# Patient Record
Sex: Female | Born: 1935
Health system: Southern US, Community
[De-identification: ages and names within clinical notes are randomized; demographics above are authoritative.]

## PROBLEM LIST (undated history)

## (undated) DIAGNOSIS — E119 Type 2 diabetes mellitus without complications: Secondary | ICD-10-CM

## (undated) DIAGNOSIS — H9190 Unspecified hearing loss, unspecified ear: Secondary | ICD-10-CM

## (undated) DIAGNOSIS — I10 Essential (primary) hypertension: Secondary | ICD-10-CM

## (undated) DIAGNOSIS — I509 Heart failure, unspecified: Secondary | ICD-10-CM

## (undated) DIAGNOSIS — E785 Hyperlipidemia, unspecified: Secondary | ICD-10-CM

## (undated) DIAGNOSIS — I219 Acute myocardial infarction, unspecified: Secondary | ICD-10-CM

## (undated) HISTORY — PX: ABDOMINAL HYSTERECTOMY: SHX81

## (undated) HISTORY — PX: CYSTOSCOPY: SUR368

## (undated) HISTORY — DX: Type 2 diabetes mellitus without complications: E11.9

## (undated) HISTORY — PX: CORONARY ANGIOPLASTY WITH STENT PLACEMENT: SHX49

## (undated) HISTORY — DX: Hyperlipidemia, unspecified: E78.5

## (undated) HISTORY — DX: Essential (primary) hypertension: I10

## (undated) HISTORY — DX: Acute myocardial infarction, unspecified: I21.9

## (undated) HISTORY — DX: Heart failure, unspecified: I50.9

---

## 2004-03-27 ENCOUNTER — Encounter: Admission: RE | Admit: 2004-03-27 | Discharge: 2004-03-27 | Payer: Self-pay | Admitting: Neurosurgery

## 2004-04-10 ENCOUNTER — Encounter: Admission: RE | Admit: 2004-04-10 | Discharge: 2004-04-10 | Payer: Self-pay | Admitting: Neurosurgery

## 2004-08-29 ENCOUNTER — Encounter: Admission: RE | Admit: 2004-08-29 | Discharge: 2004-08-29 | Payer: Self-pay | Admitting: Neurosurgery

## 2005-02-20 ENCOUNTER — Encounter: Admission: RE | Admit: 2005-02-20 | Discharge: 2005-02-20 | Payer: Self-pay | Admitting: Neurosurgery

## 2005-03-06 ENCOUNTER — Encounter: Admission: RE | Admit: 2005-03-06 | Discharge: 2005-03-06 | Payer: Self-pay | Admitting: Neurosurgery

## 2005-03-28 ENCOUNTER — Encounter: Admission: RE | Admit: 2005-03-28 | Discharge: 2005-03-28 | Payer: Self-pay | Admitting: Neurosurgery

## 2007-06-11 ENCOUNTER — Ambulatory Visit: Payer: Self-pay | Admitting: Cardiology

## 2007-06-15 ENCOUNTER — Ambulatory Visit: Payer: Self-pay | Admitting: Cardiology

## 2010-10-14 ENCOUNTER — Encounter: Payer: Self-pay | Admitting: Neurosurgery

## 2014-05-25 ENCOUNTER — Encounter (INDEPENDENT_AMBULATORY_CARE_PROVIDER_SITE_OTHER): Payer: Medicare HMO | Admitting: Ophthalmology

## 2014-05-25 DIAGNOSIS — H3530 Unspecified macular degeneration: Secondary | ICD-10-CM

## 2014-05-25 DIAGNOSIS — H35039 Hypertensive retinopathy, unspecified eye: Secondary | ICD-10-CM

## 2014-05-25 DIAGNOSIS — E11319 Type 2 diabetes mellitus with unspecified diabetic retinopathy without macular edema: Secondary | ICD-10-CM

## 2014-05-25 DIAGNOSIS — E1139 Type 2 diabetes mellitus with other diabetic ophthalmic complication: Secondary | ICD-10-CM

## 2014-05-25 DIAGNOSIS — E1165 Type 2 diabetes mellitus with hyperglycemia: Secondary | ICD-10-CM

## 2014-05-25 DIAGNOSIS — H43819 Vitreous degeneration, unspecified eye: Secondary | ICD-10-CM

## 2014-05-25 DIAGNOSIS — I10 Essential (primary) hypertension: Secondary | ICD-10-CM

## 2015-10-09 LAB — HEMOGLOBIN A1C: Hemoglobin A1C: 10.7

## 2015-11-09 ENCOUNTER — Encounter: Payer: Self-pay | Admitting: "Endocrinology

## 2015-11-09 ENCOUNTER — Encounter: Payer: Medicare HMO | Attending: "Endocrinology | Admitting: Nutrition

## 2015-11-09 ENCOUNTER — Ambulatory Visit (INDEPENDENT_AMBULATORY_CARE_PROVIDER_SITE_OTHER): Payer: Medicare HMO | Admitting: "Endocrinology

## 2015-11-09 VITALS — BP 140/90 | HR 64 | Ht 65.0 in | Wt 179.0 lb

## 2015-11-09 VITALS — Ht 67.0 in | Wt 179.0 lb

## 2015-11-09 DIAGNOSIS — I10 Essential (primary) hypertension: Secondary | ICD-10-CM | POA: Diagnosis not present

## 2015-11-09 DIAGNOSIS — E1159 Type 2 diabetes mellitus with other circulatory complications: Secondary | ICD-10-CM | POA: Insufficient documentation

## 2015-11-09 DIAGNOSIS — E118 Type 2 diabetes mellitus with unspecified complications: Secondary | ICD-10-CM

## 2015-11-09 DIAGNOSIS — E669 Obesity, unspecified: Secondary | ICD-10-CM

## 2015-11-09 DIAGNOSIS — E785 Hyperlipidemia, unspecified: Secondary | ICD-10-CM

## 2015-11-09 NOTE — Patient Instructions (Signed)

## 2015-11-09 NOTE — Progress Notes (Signed)
  Medical Nutrition Therapy:  Appt start time: 1400 end time:  1430.   Assessment:  Primary concerns today: Diabets. Lives with herself but has a female friend. Her female friend does the cooking. Walks with walker.  Ecoli infection in spring last year. A1C 10.7%. Likes to eat sweats and snack often. Is on 40 units of Levemir at night and Humalog with meals. Changing to take Levemir at night instead of in am. By Dr. Dorris Fetch today. Walk in visit.  Eats 2 meals per day. Tends to sleep in and only eat late breakfast/lunch and then dinner. Stays up at night and then snacks at times. Limited mobility. Diet is excessive in carbs and calories and low in fresh fruits, vegetables and whole grains.Needs to cut out snacks and sodas.Needs to be sure to check blood sugar before meals and take short acting insuln with meals three times per day.   Lab Results  Component Value Date   HGBA1C 10.7 10/09/2015    Preferred Learning Style:   No preference indicated   Learning Readiness:   Ready  Change in progress   MEDICATIONS: None   DIETARY INTAKE:   24-hr recall:  B ( AM): Eats late breakfast or early lunch. Sleeps in 9-10 am. Shredded wheat, bran flaks or rice krispies.  Snk ( AM): mis snack L ( PM): skips sometimes. Snk ( PM): misc crackers, cheese, chips, fruit, soda D ( PM): Meat, vegetable, Soda Snk ( PM): misc snacks. Water sometimes. Beverages: soda and water some  Usual physical activity: ADL   Estimated energy needs: 1200-1500  calories 135 g carbohydrates 90 g protein 33 g fat  Progress Towards Goal(s):  In progress.   Nutritional Diagnosis:  NB-1.1 Food and nutrition-related knowledge deficit As related to DM.  As evidenced by A1C >10%..    Intervention:  Nutrition and Diabetes education provided on My Plate, CHO counting, meal planning, portion sizes, timing of meals, avoiding snacks between meals unless having a low blood sugar, target ranges for A1C and blood sugars,  signs/symptoms and treatment of hyper/hypoglycemia, monitoring blood sugars, taking medications as prescribed, benefits of exercising 30 minutes per day and prevention of complications of DM. Marland Kitchen  Goals 1. Follow the Plate Method 2. Eat three meals per day. 3. Do not skip meals. 4 Eat 2-3 carb choices per meas. 5. Eat meals on time. 6. Take Levemir 40 units at night now instead of the am. 7. Get A1C. Down to 7.5%.  Teaching Method Utilized:  Visual Auditory Hands on  Handouts given during visit include:  My Plate Method  Meal Plan Card  Diabetes Instructions.   Barriers to learning/adherence to lifestyle change: None  Demonstrated degree of understanding via:  Teach Back   Monitoring/Evaluation:  Dietary intake, exercise, meal planning, SBG, and body weight in 1 month(s).

## 2015-11-09 NOTE — Progress Notes (Signed)
Subjective:    Patient ID: Stephanie Orozco, female    DOB: 01-21-1936. Patient is being seen in consultation for management of diabetes requested by  [facility], MD  Past Medical History  Diagnosis Date  . Diabetes mellitus, type II ([place])   . Hypertension   . Hyperlipidemia   . Heart attack ([place])   . Heart failure [facility])    Past Surgical History  Procedure Laterality Date  . Coronary angioplasty with stent placement    . Abdominal hysterectomy    . Cystoscopy     Social History   Social History  . Marital Status: Married    Spouse Name: N/A  . Number of Children: N/A  . Years of Education: N/A   Social History Main Topics  . Smoking status: Never Smoker   . Smokeless tobacco: Not on file  . Alcohol Use: No  . Drug Use: No  . Sexual Activity: Not on file   Other Topics Concern  . Not on file   Social History Narrative  . No narrative on file   Outpatient Encounter Prescriptions as of 11/09/2015  Medication Sig  . aspirin 81 MG tablet Take 81 mg by mouth daily.  . calcium carbonate (OS-CAL) 1250 (500 Ca) MG chewable tablet Chew 1 tablet by mouth daily.  . fesoterodine (TOVIAZ) 8 MG TB24 tablet Take 8 mg by mouth daily.  Marland Kitchen gabapentin (NEURONTIN) 300 MG capsule Take 300 mg by mouth 3 (three) times daily.  . Insulin Detemir (LEVEMIR FLEXPEN) 100 UNIT/ML Pen Inject 40 Units into the skin at bedtime.  . isosorbide mononitrate (IMDUR) 30 MG 24 hr tablet Take 30 mg by mouth daily.  Marland Kitchen losartan (COZAAR) 50 MG tablet Take 50 mg by mouth daily.  . metFORMIN (GLUCOPHAGE) 500 MG tablet Take 500 mg by mouth 2 (two) times daily after a meal.  . metoprolol (LOPRESSOR) 100 MG tablet Take 50 mg by mouth 2 (two) times daily.  . Multiple Vitamin (MULTIVITAMIN) capsule Take 1 capsule by mouth daily.  . pantoprazole (PROTONIX) 40 MG tablet Take 40 mg by mouth daily.  . simvastatin (ZOCOR) 10 MG tablet Take 10 mg by mouth daily.  . sitaGLIPtin (JANUVIA) 100 MG tablet Take 50 mg by  mouth daily.  Marland Kitchen torsemide (DEMADEX) 20 MG tablet Take 10 mg by mouth daily.  Marland Kitchen warfarin (COUMADIN) 7.5 MG tablet Take 7.5 mg by mouth daily.  . insulin lispro (HUMALOG KWIKPEN) 100 UNIT/ML KiwkPen Inject into the skin 3 (three) times daily.   No facility-administered encounter medications on file as of 11/09/2015.   ALLERGIES: Allergies not on file VACCINATION STATUS:  There is no immunization history on file for this patient.  Diabetes She presents for her initial diabetic visit. She has type 2 diabetes mellitus. Onset time: She was diagnosed at approximate age of 80 years. Her disease course has been worsening. There are no hypoglycemic associated symptoms. Pertinent negatives for hypoglycemia include no confusion, headaches, pallor or seizures. Associated symptoms include fatigue, foot paresthesias, polydipsia, polyuria and visual change. Pertinent negatives for diabetes include no chest pain and no polyphagia. There are no hypoglycemic complications. Symptoms are worsening. Diabetic complications include peripheral neuropathy and retinopathy. Risk factors for coronary artery disease include diabetes mellitus, dyslipidemia, hypertension, obesity and sedentary lifestyle. Current diabetic treatment includes insulin injections (She is on Levemir 50-60 units nightly, metformin 1500 mg a day, Januvia 100 mg a day, even though she has Humalog prescribed she did not start taking it.). Her weight  is increasing steadily. She is following a generally unhealthy diet. When asked about meal planning, she reported none. Prior visit with dietitian: She will have a visit with a dietitian today. Home blood sugar record trend: She did not bring any meter nor logs to review. An ACE inhibitor/angiotensin II receptor blocker is being taken. Eye exam is current.  Hyperlipidemia This is a chronic problem. The current episode started more than 1 year ago. Pertinent negatives include no chest pain, myalgias or shortness of  breath. Current antihyperlipidemic treatment includes statins. Risk factors for coronary artery disease include dyslipidemia, diabetes mellitus, hypertension, obesity and a sedentary lifestyle.  Hypertension This is a chronic problem. The current episode started more than 1 year ago. Pertinent negatives include no chest pain, headaches, palpitations or shortness of breath. Risk factors for coronary artery disease include diabetes mellitus, dyslipidemia and sedentary lifestyle. Past treatments include angiotensin blockers. Hypertensive end-organ damage includes retinopathy.      Review of Systems  Constitutional: Positive for fatigue. Negative for fever, chills and unexpected weight change.  HENT: Negative for trouble swallowing and voice change.   Eyes: Negative for visual disturbance.  Respiratory: Negative for cough, shortness of breath and wheezing.   Cardiovascular: Negative for chest pain, palpitations and leg swelling.  Gastrointestinal: Negative for nausea, vomiting and diarrhea.  Endocrine: Positive for polydipsia and polyuria. Negative for cold intolerance, heat intolerance and polyphagia.  Musculoskeletal: Negative for myalgias and arthralgias.  Skin: Negative for color change, pallor, rash and wound.  Neurological: Negative for seizures and headaches.  Psychiatric/Behavioral: Negative for suicidal ideas and confusion.    Objective:    BP 140/90 mmHg  Pulse 64  Ht 5\' 5"  (1.651 m)  Wt 179 lb (81.194 kg)  BMI 29.79 kg/m2  SpO2 95%  Wt Readings from Last 3 Encounters:  11/09/15 179 lb (81.194 kg)    Physical Exam  Constitutional: She is oriented to person, place, and time. She appears well-developed.  She walks with a walker.  HENT:  Head: Normocephalic and atraumatic.  Eyes: EOM are normal.  Neck: Normal range of motion. Neck supple. No tracheal deviation present. No thyromegaly present.  Cardiovascular: Normal rate and regular rhythm.   Pulses:      Dorsalis pedis  pulses are 0 on the right side, and 0 on the left side.       Posterior tibial pulses are 0 on the right side, and 0 on the left side.  Pulmonary/Chest: Effort normal and breath sounds normal.  Abdominal: Soft. Bowel sounds are normal. There is no tenderness. There is no guarding.  Musculoskeletal: Normal range of motion. She exhibits no edema.       Feet:  Neurological: She is alert and oriented to person, place, and time. She has normal reflexes. No cranial nerve deficit. Coordination normal.  Skin: Skin is warm and dry. No rash noted. No erythema. No pallor.  Psychiatric: She has a normal mood and affect. Judgment normal.   Assessment & Plan:   1. DM type 2 causing vascular disease ([place])   - Patient has currently uncontrolled symptomatic type 2 DM since  80 years of age,  with most recent A1c of 10.7 %. Recent labs reviewed.   Her diabetes is complicated by coronary artery disease, refractory disease, retinopathy and patient remains at a high risk for more acute and chronic complications of diabetes which include CAD, CVA, CKD, retinopathy, and neuropathy. These are all discussed in detail with the patient.  - I have counseled  the patient on diet management and weight loss, by adopting a carbohydrate restricted/protein rich diet.  - Suggestion is made for patient to avoid simple carbohydrates   from their diet including Cakes , Desserts, Ice Cream,  Soda (  diet and regular) , Sweet Tea , Candies,  Chips, Cookies, Artificial Sweeteners,   and "Sugar-free" Products . This will help patient to have stable blood glucose profile and potentially avoid unintended weight gain.  - I encouraged the patient to switch to  unprocessed or minimally processed complex starch and increased protein intake (animal or plant source), fruits, and vegetables.  - Patient is advised to stick to a routine mealtimes to eat 3 meals  a day and avoid unnecessary snacks ( to snack only to correct hypoglycemia).  -  The patient will be scheduled with Jearld Fenton, RDN, CDE for individualized DM education.  - I have approached patient with the following individualized plan to manage diabetes and patient agrees:   - I  will proceed to readjust basal insulin Levemir to 40 units QHS,  associated with strict monitoring of glucose  AC and HS. -Based on her commitment and her blood glucose readings she will be resumed on Humalog for prandial coverage next visit in 1 week. -I advised her to hold Humalog until she returns for follow-up. - Patient is warned not to take insulin without proper monitoring per orders.  -Patient is encouraged to call clinic for blood glucose levels less than 70 or above 300 mg /dl. - I will continue metformin 500 mg by mouth twice a day and Januvia 50 mg by mouth daily (half of the 100 mg pill), therapeutically suitable for patient.  - Patient specific target  A1c;  LDL, HDL, Triglycerides, and  Waist Circumference were discussed in detail.  2) BP/HTN: Controlled. Continue current medications including ACEI/ARB. 3) Lipids/HPL:  Control unknown, continue statins. 4)  Weight/Diet: CDE Consult will be initiated , exercise, and detailed carbohydrates information provided.  5) Chronic Care/Health Maintenance:  -Patient  on ACEI/ARB and Statin medications and encouraged to continue to follow up with Ophthalmology, Podiatrist at least yearly or according to recommendations, and advised to   stay away from smoking. I have recommended yearly flu vaccine and pneumonia vaccination at least every 5 years; moderate intensity exercise for up to 150 minutes weekly; and  sleep for at least 7 hours a day.  - 60 minutes of time was spent on the care of this patient , 50% of which was applied for counseling on diabetes complications and their preventions.  - Patient to bring meter and  blood glucose logs during their next visit.   - I advised patient to maintain close follow up with [facility], MD  for primary care needs.  Follow up plan: - Return in about 1 week (around 11/16/2015) for diabetes, high blood pressure, high cholesterol, follow up with meter and logs- no labs.  Glade Lloyd, MD Phone: [phone]  Fax: [phone]   11/09/2015, 2:41 PM

## 2015-11-09 NOTE — Patient Instructions (Signed)
Goals 1. Follow the Plate Method 2. Eat three meals per day. 3. Do not skip meals. 4 Eat 2-3 carb choices per meas. 5. Eat meals on time. 6. Take Levemir 40 units at night now instead of the am. 7. Get A1C. Down to 7.5%.

## 2015-11-10 ENCOUNTER — Telehealth: Payer: Self-pay

## 2015-11-10 ENCOUNTER — Encounter: Payer: Self-pay | Admitting: Nutrition

## 2015-11-10 NOTE — Telephone Encounter (Signed)
error 

## 2015-11-13 ENCOUNTER — Telehealth: Payer: Self-pay | Admitting: "Endocrinology

## 2015-11-13 NOTE — Telephone Encounter (Signed)
Dr. Dorris Fetch wanted me to call Stephanie Orozco to check on her. She said her readings are doing much better. Her morning reading was 102

## 2015-11-22 ENCOUNTER — Ambulatory Visit (INDEPENDENT_AMBULATORY_CARE_PROVIDER_SITE_OTHER): Payer: Medicare HMO | Admitting: "Endocrinology

## 2015-11-22 ENCOUNTER — Encounter: Payer: Self-pay | Admitting: "Endocrinology

## 2015-11-22 ENCOUNTER — Encounter: Payer: Medicare HMO | Attending: "Endocrinology | Admitting: Nutrition

## 2015-11-22 VITALS — Ht 67.0 in | Wt 175.0 lb

## 2015-11-22 VITALS — BP 132/75 | HR 63 | Ht 67.0 in | Wt 175.0 lb

## 2015-11-22 DIAGNOSIS — E669 Obesity, unspecified: Secondary | ICD-10-CM

## 2015-11-22 DIAGNOSIS — E1165 Type 2 diabetes mellitus with hyperglycemia: Secondary | ICD-10-CM

## 2015-11-22 DIAGNOSIS — E1159 Type 2 diabetes mellitus with other circulatory complications: Secondary | ICD-10-CM | POA: Insufficient documentation

## 2015-11-22 DIAGNOSIS — I10 Essential (primary) hypertension: Secondary | ICD-10-CM | POA: Diagnosis not present

## 2015-11-22 DIAGNOSIS — E785 Hyperlipidemia, unspecified: Secondary | ICD-10-CM | POA: Diagnosis not present

## 2015-11-22 DIAGNOSIS — Z794 Long term (current) use of insulin: Secondary | ICD-10-CM

## 2015-11-22 DIAGNOSIS — IMO0002 Reserved for concepts with insufficient information to code with codable children: Secondary | ICD-10-CM

## 2015-11-22 DIAGNOSIS — E118 Type 2 diabetes mellitus with unspecified complications: Secondary | ICD-10-CM

## 2015-11-22 MED ORDER — SITAGLIPTIN PHOSPHATE 50 MG PO TABS: 50.0000 mg | ORAL_TABLET | Freq: Every day | ORAL | Status: DC

## 2015-11-22 NOTE — Progress Notes (Signed)
Subjective:    Patient ID: Stephanie Orozco, female    DOB: June 25, 1936. Patient is being seen in consultation for management of diabetes requested by  [facility], MD  Past Medical History  Diagnosis Date  . Diabetes mellitus, type II ([place])   . Hypertension   . Hyperlipidemia   . Heart attack ([place])   . Heart failure [facility])    Past Surgical History  Procedure Laterality Date  . Coronary angioplasty with stent placement    . Abdominal hysterectomy    . Cystoscopy     Social History   Social History  . Marital Status: Married    Spouse Name: N/A  . Number of Children: N/A  . Years of Education: N/A   Social History Main Topics  . Smoking status: Never Smoker   . Smokeless tobacco: None  . Alcohol Use: No  . Drug Use: No  . Sexual Activity: Not Asked   Other Topics Concern  . None   Social History Narrative   Outpatient Encounter Prescriptions as of 11/22/2015  Medication Sig  . aspirin 81 MG tablet Take 81 mg by mouth daily.  . calcium carbonate (OS-CAL) 1250 (500 Ca) MG chewable tablet Chew 1 tablet by mouth daily.  . fesoterodine (TOVIAZ) 8 MG TB24 tablet Take 8 mg by mouth daily.  Marland Kitchen gabapentin (NEURONTIN) 300 MG capsule Take 300 mg by mouth 3 (three) times daily.  . Insulin Detemir (LEVEMIR FLEXPEN) 100 UNIT/ML Pen Inject 46 Units into the skin at bedtime.  . isosorbide mononitrate (IMDUR) 30 MG 24 hr tablet Take 30 mg by mouth daily.  Marland Kitchen losartan (COZAAR) 50 MG tablet Take 50 mg by mouth daily.  . metFORMIN (GLUCOPHAGE) 500 MG tablet Take 500 mg by mouth 2 (two) times daily after a meal.  . metoprolol (LOPRESSOR) 100 MG tablet Take 50 mg by mouth 2 (two) times daily.  . Multiple Vitamin (MULTIVITAMIN) capsule Take 1 capsule by mouth daily.  . pantoprazole (PROTONIX) 40 MG tablet Take 40 mg by mouth daily.  . simvastatin (ZOCOR) 10 MG tablet Take 10 mg by mouth daily.  . sitaGLIPtin (JANUVIA) 50 MG tablet Take 1 tablet (50 mg total) by mouth daily.  Marland Kitchen torsemide  (DEMADEX) 20 MG tablet Take 10 mg by mouth daily.  Marland Kitchen warfarin (COUMADIN) 7.5 MG tablet Take 7.5 mg by mouth daily.  . [DISCONTINUED] insulin lispro (HUMALOG KWIKPEN) 100 UNIT/ML KiwkPen Inject into the skin 3 (three) times daily.  . [DISCONTINUED] sitaGLIPtin (JANUVIA) 100 MG tablet Take 50 mg by mouth daily.   No facility-administered encounter medications on file as of 11/22/2015.   ALLERGIES: No Known Allergies VACCINATION STATUS:  There is no immunization history on file for this patient.  Diabetes She presents for her follow-up diabetic visit. She has type 2 diabetes mellitus. Onset time: She was diagnosed at approximate age of 69 years. Her disease course has been improving. There are no hypoglycemic associated symptoms. Pertinent negatives for hypoglycemia include no confusion, headaches, pallor or seizures. Associated symptoms include fatigue, foot paresthesias and visual change. Pertinent negatives for diabetes include no chest pain, no polydipsia, no polyphagia and no polyuria. There are no hypoglycemic complications. Symptoms are improving. Diabetic complications include peripheral neuropathy and retinopathy. Risk factors for coronary artery disease include diabetes mellitus, dyslipidemia, hypertension, obesity and sedentary lifestyle. Current diabetic treatment includes insulin injections (She is on Levemir 50-60 units nightly, metformin 1500 mg a day, Januvia 100 mg a day, even though she has Humalog prescribed  she did not start taking it.). Her weight is decreasing steadily. She is following a generally unhealthy diet. When asked about meal planning, she reported none. Prior visit with dietitian: She will have a visit with a dietitian today. Home blood sugar record trend: She did bring her log showing significant improvement in her glucose profile. Her overall blood glucose range is 140-180 mg/dl. An ACE inhibitor/angiotensin II receptor blocker is being taken. Eye exam is current.    Hyperlipidemia This is a chronic problem. The current episode started more than 1 year ago. Pertinent negatives include no chest pain, myalgias or shortness of breath. Current antihyperlipidemic treatment includes statins. Risk factors for coronary artery disease include dyslipidemia, diabetes mellitus, hypertension, obesity and a sedentary lifestyle.  Hypertension This is a chronic problem. The current episode started more than 1 year ago. Pertinent negatives include no chest pain, headaches, palpitations or shortness of breath. Risk factors for coronary artery disease include diabetes mellitus, dyslipidemia and sedentary lifestyle. Past treatments include angiotensin blockers. Hypertensive end-organ damage includes retinopathy.      Review of Systems  Constitutional: Positive for fatigue. Negative for fever, chills and unexpected weight change.  HENT: Negative for trouble swallowing and voice change.   Eyes: Negative for visual disturbance.  Respiratory: Negative for cough, shortness of breath and wheezing.   Cardiovascular: Negative for chest pain, palpitations and leg swelling.  Gastrointestinal: Negative for nausea, vomiting and diarrhea.  Endocrine: Negative for cold intolerance, heat intolerance, polydipsia, polyphagia and polyuria.  Musculoskeletal: Negative for myalgias and arthralgias.  Skin: Negative for color change, pallor, rash and wound.  Neurological: Negative for seizures and headaches.  Psychiatric/Behavioral: Negative for suicidal ideas and confusion.    Objective:    BP 132/75 mmHg  Pulse 63  Ht 5\' 7"  (1.702 m)  Wt 175 lb (79.379 kg)  BMI 27.40 kg/m2  SpO2 95%  Wt Readings from Last 3 Encounters:  11/22/15 175 lb (79.379 kg)  11/09/15 179 lb (81.194 kg)  11/09/15 179 lb (81.194 kg)    Physical Exam  Constitutional: She is oriented to person, place, and time. She appears well-developed.  She walks with a walker.  HENT:  Head: Normocephalic and atraumatic.   Eyes: EOM are normal.  Neck: Normal range of motion. Neck supple. No tracheal deviation present. No thyromegaly present.  Cardiovascular: Normal rate and regular rhythm.   Pulses:      Dorsalis pedis pulses are 0 on the right side, and 0 on the left side.       Posterior tibial pulses are 0 on the right side, and 0 on the left side.  Pulmonary/Chest: Effort normal and breath sounds normal.  Abdominal: Soft. Bowel sounds are normal. There is no tenderness. There is no guarding.  Musculoskeletal: Normal range of motion. She exhibits no edema.       Feet:  Neurological: She is alert and oriented to person, place, and time. She has normal reflexes. No cranial nerve deficit. Coordination normal.  Skin: Skin is warm and dry. No rash noted. No erythema. No pallor.  Psychiatric: She has a normal mood and affect. Judgment normal.   Assessment & Plan:   1. DM type 2 causing vascular disease ([place])   - Patient has currently uncontrolled symptomatic type 2 DM since  80 years of age,  with most recent A1c of 10.7 %. Recent labs reviewed. - She came with blood glucose readings near target, no hypoglycemia.-In the interim she called for hyperglycemia 501, advised to increase her  Levemir to 60 units daily at bedtime. Her subsequent readings are such that she did have a few fasting readings in the 80s.  Her diabetes is complicated by coronary artery disease, refractory disease, retinopathy and patient remains at a high risk for more acute and chronic complications of diabetes which include CAD, CVA, CKD, retinopathy, and neuropathy. These are all discussed in detail with the patient.  - I have counseled the patient on diet management and weight loss, by adopting a carbohydrate restricted/protein rich diet.  - Suggestion is made for patient to avoid simple carbohydrates   from their diet including Cakes , Desserts, Ice Cream,  Soda (  diet and regular) , Sweet Tea , Candies,  Chips, Cookies, Artificial  Sweeteners,   and "Sugar-free" Products . This will help patient to have stable blood glucose profile and potentially avoid unintended weight gain.  - I encouraged the patient to switch to  unprocessed or minimally processed complex starch and increased protein intake (animal or plant source), fruits, and vegetables.  - Patient is advised to stick to a routine mealtimes to eat 3 meals  a day and avoid unnecessary snacks ( to snack only to correct hypoglycemia).  - The patient will be scheduled with Jearld Fenton, RDN, CDE for individualized DM education.  - I have approached patient with the following individualized plan to manage diabetes and patient agrees:   - I  will proceed to readjust basal insulin Levemir to 46 units QHS,  associated with strict monitoring of glucose  before breakfast and at bedtime .  -Based on her  blood glucose readings she will not need   prandial insulin for now.   -I advised her to hold Humalog. - Patient is warned not to take insulin without proper monitoring per orders.  -Patient is encouraged to call clinic for blood glucose levels less than 70 or above 300 mg /dl. - I will continue metformin 500 mg by mouth twice a day and Januvia 50 mg by mouth daily therapeutically suitable for patient.  - Patient specific target  A1c;  LDL, HDL, Triglycerides, and  Waist Circumference were discussed in detail.  2) BP/HTN: Controlled. Continue current medications including ACEI/ARB. 3) Lipids/HPL:  Control unknown, continue statins. 4)  Weight/Diet: CDE Consult will be initiated , exercise, and detailed carbohydrates information provided.  5) Chronic Care/Health Maintenance:  -Patient  on ACEI/ARB and Statin medications and encouraged to continue to follow up with Ophthalmology, Podiatrist at least yearly or according to recommendations, and advised to   stay away from smoking. I have recommended yearly flu vaccine and pneumonia vaccination at least every 5 years;  moderate intensity exercise for up to 150 minutes weekly; and  sleep for at least 7 hours a day.  - 25 minutes of time was spent on the care of this patient , 50% of which was applied for counseling on diabetes complications and their preventions.  - Patient to bring meter and  blood glucose logs during their next visit.   - I advised patient to maintain close follow up with [facility], MD for primary care needs.  Follow up plan: - Return in about 6 weeks (around 01/03/2016) for diabetes, high blood pressure, high cholesterol, follow up with pre-visit labs, meter, and logs.  Glade Lloyd, MD Phone: [phone]  Fax: [phone]   11/22/2015, 11:51 AM

## 2015-11-22 NOTE — Patient Instructions (Signed)
Advice for weight management -For most of us the best way to lose weight is by diet management. Generally speaking, diet management means restricting carbohydrate consumption to minimum possible (and to unprocessed or minimally processed complex starch) and increasing protein intake (animal or plant source), fruits, and vegetables.  -Sticking to a routine mealtime to eat 3 meals a day and avoiding unnecessary snacks is shown to have a big role in weight control.  -It is better to avoid simple carbohydrates including: Cakes, Desserts, Ice Cream, Soda (diet and regular), Sweet Tea, Candies, Chips, Cookies, Artificial Sweeteners, and "Sugar-free" Products.   -Exercise: 30 minutes a day 3-4 days a week, or 150 minutes a week. Combine stretch, strength, and aerobic activities. You may seek evaluation by your heart doctor prior to initiating exercise if you have high risk for heart disease.  -If you are interested, we can schedule a visit with Stephanie Orozco, RDN, CDE for individualized nutrition education.  

## 2015-11-22 NOTE — Progress Notes (Signed)
  Medical Nutrition Therapy:  Appt start time: 1200 end time:  1230.  Assessment:  Primary concerns today: Diabetes. Lost 4 lbs. BS log brought in. BS are much better. BS are in 90-120 fasting. BS overall much better. Taking 50 units of Levemir and Januvia. Saw Dr. Dorris Fetch today and will reduce Levemir to 46 units daily and reduce Januvia to 50 mg per day an taking 500 mg of Metformin BID. Now to take Levemir at night instead of in am.. She feels better.  Eating more regular meals and eating on better schedule. Avoiding snacks.   Lab Results  Component Value Date   HGBA1C 10.7 10/09/2015    Preferred Learning Style:   No preference indicated   Learning Readiness:   Ready  Change in progress   MEDICATIONS: None   DIETARY INTAKE:   24-hr recall:  B ( AM): 1 c bran flakes, 1/2 banaa,  Snk ( AM):   L ( PM): Sandwich , apple, water Snk ( PM):  D ( PM):Grilled chicken, green beans, slaw water, Snk ( PM): misc snacks. Water sometimes. Beverages: soda and water some  Usual physical activity: ADL   Estimated energy needs: 1200-1500  calories 135 g carbohydrates 90 g protein 33 g fat  Progress Towards Goal(s):  In progress.   Nutritional Diagnosis:  NB-1.1 Food and nutrition-related knowledge deficit As related to DM.  As evidenced by A1C >10%..    Intervention:  Nutrition and Diabetes education provided on My Plate, CHO counting, meal planning, portion sizes, timing of meals, avoiding snacks between meals unless having a low blood sugar, target ranges for A1C and blood sugars, signs/symptoms and treatment of hyper/hypoglycemia, monitoring blood sugars, taking medications as prescribed, benefits of exercising 30 minutes per day and prevention of complications of DM. Marland Kitchen  Goals 1. Follow the Plate Method 2. Eat three meals per day. 3. Do not skip meals. 4 Eat 2-3 carb choices per meas. 5. Eat meals on time. 6. Take Levemir 46 units at night now instead of the am. 7. Get A1C.  Down to 7.5%.  Teaching Method Utilized:  Visual Auditory Hands on  Handouts given during visit include:  My Plate Method  Meal Plan Card  Diabetes Instructions.   Barriers to learning/adherence to lifestyle change: None  Demonstrated degree of understanding via:  Teach Back   Monitoring/Evaluation:  Dietary intake, exercise, meal planning, SBG, and body weight in 1-3 month(s).

## 2015-11-24 NOTE — Patient Instructions (Signed)
  Goals 1. Follow the Plate Method 2. Eat three meals per day. 3. Do not skip meals. 4 Eat 2-3 carb choices per meas. 5. Eat meals on time. 6. Take Levemir 46 units at night now instead of the am. 7. Get A1C. Down to 7.5%

## 2015-12-27 ENCOUNTER — Other Ambulatory Visit: Payer: Self-pay | Admitting: "Endocrinology

## 2015-12-28 LAB — BASIC METABOLIC PANEL
BUN/Creatinine Ratio: 21 (ref 12–28)
BUN: 14 mg/dL (ref 8–27)
CO2: 27 mmol/L (ref 18–29)
Calcium: 9.7 mg/dL (ref 8.7–10.3)
Chloride: 102 mmol/L (ref 96–106)
Creatinine, Ser: 0.66 mg/dL (ref 0.57–1.00)
GFR calc Af Amer: 99 mL/min/{1.73_m2} (ref >59)
GFR calc non Af Amer: 85 mL/min/{1.73_m2} (ref >59)
Glucose: 135 mg/dL — ABNORMAL HIGH (ref 65–99)
POTASSIUM: 3.8 mmol/L (ref 3.5–5.2)
SODIUM: 142 mmol/L (ref 134–144)

## 2015-12-28 LAB — TSH: TSH: 0.104 u[IU]/mL — ABNORMAL LOW (ref 0.450–4.500)

## 2015-12-28 LAB — LIPID PANEL W/O CHOL/HDL RATIO
Cholesterol, Total: 93 mg/dL — ABNORMAL LOW (ref 100–199)
HDL: 25 mg/dL — ABNORMAL LOW (ref >39)
LDL Calculated: 48 mg/dL (ref 0–99)
Triglycerides: 100 mg/dL (ref 0–149)
VLDL Cholesterol Cal: 20 mg/dL (ref 5–40)

## 2015-12-28 LAB — T4, FREE: Free T4: 1.28 ng/dL (ref 0.82–1.77)

## 2015-12-28 LAB — HGB A1C W/O EAG: Hgb A1c MFr Bld: 8.3 % — ABNORMAL HIGH (ref 4.8–5.6)

## 2016-01-03 ENCOUNTER — Encounter: Payer: Self-pay | Admitting: "Endocrinology

## 2016-01-03 ENCOUNTER — Encounter: Payer: Medicare HMO | Attending: "Endocrinology | Admitting: Nutrition

## 2016-01-03 ENCOUNTER — Ambulatory Visit: Payer: Medicare HMO | Admitting: Nutrition

## 2016-01-03 ENCOUNTER — Ambulatory Visit (INDEPENDENT_AMBULATORY_CARE_PROVIDER_SITE_OTHER): Payer: Medicare HMO | Admitting: "Endocrinology

## 2016-01-03 VITALS — Ht 67.0 in | Wt 172.0 lb

## 2016-01-03 VITALS — BP 139/81 | HR 69 | Ht 67.0 in | Wt 172.0 lb

## 2016-01-03 DIAGNOSIS — IMO0002 Reserved for concepts with insufficient information to code with codable children: Secondary | ICD-10-CM

## 2016-01-03 DIAGNOSIS — E785 Hyperlipidemia, unspecified: Secondary | ICD-10-CM

## 2016-01-03 DIAGNOSIS — Z794 Long term (current) use of insulin: Secondary | ICD-10-CM

## 2016-01-03 DIAGNOSIS — E1159 Type 2 diabetes mellitus with other circulatory complications: Secondary | ICD-10-CM | POA: Diagnosis not present

## 2016-01-03 DIAGNOSIS — E0590 Thyrotoxicosis, unspecified without thyrotoxic crisis or storm: Secondary | ICD-10-CM | POA: Diagnosis not present

## 2016-01-03 DIAGNOSIS — E118 Type 2 diabetes mellitus with unspecified complications: Secondary | ICD-10-CM

## 2016-01-03 DIAGNOSIS — I10 Essential (primary) hypertension: Secondary | ICD-10-CM

## 2016-01-03 DIAGNOSIS — E1165 Type 2 diabetes mellitus with hyperglycemia: Secondary | ICD-10-CM

## 2016-01-03 NOTE — Progress Notes (Signed)
Subjective:    Patient ID: Stephanie Orozco, female    DOB: November 01, 1935. Patient is being seen in consultation for management of diabetes requested by  [facility], MD  Past Medical History  Diagnosis Date  . Diabetes mellitus, type II ([place])   . Hypertension   . Hyperlipidemia   . Heart attack ([place])   . Heart failure [facility])    Past Surgical History  Procedure Laterality Date  . Coronary angioplasty with stent placement    . Abdominal hysterectomy    . Cystoscopy     Social History   Social History  . Marital Status: Married    Spouse Name: N/A  . Number of Children: N/A  . Years of Education: N/A   Social History Main Topics  . Smoking status: Never Smoker   . Smokeless tobacco: None  . Alcohol Use: No  . Drug Use: No  . Sexual Activity: Not Asked   Other Topics Concern  . None   Social History Narrative   Outpatient Encounter Prescriptions as of 01/03/2016  Medication Sig  . aspirin 81 MG tablet Take 81 mg by mouth daily.  . calcium carbonate (OS-CAL) 1250 (500 Ca) MG chewable tablet Chew 1 tablet by mouth daily.  . fesoterodine (TOVIAZ) 8 MG TB24 tablet Take 8 mg by mouth daily.  Marland Kitchen gabapentin (NEURONTIN) 300 MG capsule Take 300 mg by mouth 3 (three) times daily.  . Insulin Detemir (LEVEMIR FLEXPEN) 100 UNIT/ML Pen Inject 46 Units into the skin at bedtime.  . isosorbide mononitrate (IMDUR) 30 MG 24 hr tablet Take 30 mg by mouth daily.  Marland Kitchen losartan (COZAAR) 50 MG tablet Take 50 mg by mouth daily.  . metFORMIN (GLUCOPHAGE) 500 MG tablet Take 500 mg by mouth 2 (two) times daily after a meal.  . metoprolol (LOPRESSOR) 100 MG tablet Take 50 mg by mouth 2 (two) times daily.  . Multiple Vitamin (MULTIVITAMIN) capsule Take 1 capsule by mouth daily.  . pantoprazole (PROTONIX) 40 MG tablet Take 40 mg by mouth daily.  . simvastatin (ZOCOR) 10 MG tablet Take 10 mg by mouth daily.  . sitaGLIPtin (JANUVIA) 50 MG tablet Take 1 tablet (50 mg total) by mouth daily.  Marland Kitchen torsemide  (DEMADEX) 20 MG tablet Take 10 mg by mouth daily.  Marland Kitchen warfarin (COUMADIN) 7.5 MG tablet Take 7.5 mg by mouth daily.   No facility-administered encounter medications on file as of 01/03/2016.   ALLERGIES: No Known Allergies VACCINATION STATUS:  There is no immunization history on file for this patient.  Diabetes She presents for her follow-up diabetic visit. She has type 2 diabetes mellitus. Onset time: She was diagnosed at approximate age of 52 years. Her disease course has been improving. There are no hypoglycemic associated symptoms. Pertinent negatives for hypoglycemia include no confusion, headaches, pallor or seizures. Associated symptoms include fatigue, foot paresthesias and visual change. Pertinent negatives for diabetes include no chest pain, no polydipsia, no polyphagia and no polyuria. There are no hypoglycemic complications. Symptoms are improving. Diabetic complications include peripheral neuropathy and retinopathy. Risk factors for coronary artery disease include diabetes mellitus, dyslipidemia, hypertension, obesity and sedentary lifestyle. Current diabetic treatment includes insulin injections and oral agent (dual therapy). Her weight is decreasing steadily. She is following a generally unhealthy diet. When asked about meal planning, she reported none. Prior visit with dietitian: She will have a visit with a dietitian today. Home blood sugar record trend: She did bring her log showing significant improvement in her glucose profile.  Her breakfast blood glucose range is generally 180-200 mg/dl. Her dinner blood glucose range is generally 180-200 mg/dl. An ACE inhibitor/angiotensin II receptor blocker is being taken. Eye exam is current.  Hyperlipidemia This is a chronic problem. The current episode started more than 1 year ago. Pertinent negatives include no chest pain, myalgias or shortness of breath. Current antihyperlipidemic treatment includes statins. Risk factors for coronary artery  disease include dyslipidemia, diabetes mellitus, hypertension, obesity and a sedentary lifestyle.  Hypertension This is a chronic problem. The current episode started more than 1 year ago. Pertinent negatives include no chest pain, headaches, palpitations or shortness of breath. Risk factors for coronary artery disease include diabetes mellitus, dyslipidemia and sedentary lifestyle. Past treatments include angiotensin blockers. Hypertensive end-organ damage includes retinopathy.      Review of Systems  Constitutional: Positive for fatigue. Negative for fever, chills and unexpected weight change.  HENT: Negative for trouble swallowing and voice change.   Eyes: Negative for visual disturbance.  Respiratory: Negative for cough, shortness of breath and wheezing.   Cardiovascular: Negative for chest pain, palpitations and leg swelling.  Gastrointestinal: Negative for nausea, vomiting and diarrhea.  Endocrine: Negative for cold intolerance, heat intolerance, polydipsia, polyphagia and polyuria.  Musculoskeletal: Negative for myalgias and arthralgias.  Skin: Negative for color change, pallor, rash and wound.  Neurological: Negative for seizures and headaches.  Psychiatric/Behavioral: Negative for suicidal ideas and confusion.    Objective:    BP 139/81 mmHg  Pulse 69  Ht 5\' 7"  (1.702 m)  Wt 172 lb (78.019 kg)  BMI 26.93 kg/m2  SpO2 98%  Wt Readings from Last 3 Encounters:  01/03/16 172 lb (78.019 kg)  11/22/15 175 lb (79.379 kg)  11/22/15 175 lb (79.379 kg)    Physical Exam  Constitutional: She is oriented to person, place, and time. She appears well-developed.  She walks with a walker.  HENT:  Head: Normocephalic and atraumatic.  Eyes: EOM are normal.  Neck: Normal range of motion. Neck supple. No tracheal deviation present. No thyromegaly present.  Cardiovascular: Normal rate and regular rhythm.   Pulses:      Dorsalis pedis pulses are 0 on the right side, and 0 on the left  side.       Posterior tibial pulses are 0 on the right side, and 0 on the left side.  Pulmonary/Chest: Effort normal and breath sounds normal.  Abdominal: Soft. Bowel sounds are normal. There is no tenderness. There is no guarding.  Musculoskeletal: Normal range of motion. She exhibits no edema.       Feet:  Neurological: She is alert and oriented to person, place, and time. She has normal reflexes. No cranial nerve deficit. Coordination normal.  Skin: Skin is warm and dry. No rash noted. No erythema. No pallor.  Psychiatric: She has a normal mood and affect. Judgment normal.   Assessment & Plan:   1. DM type 2 causing vascular disease ([place])   - Patient has currently uncontrolled symptomatic type 2 DM since  80 years of age,  with most recent A1c of 8.3% improving from 10.7 %. Recent labs reviewed.   Her diabetes is complicated by coronary artery disease, refractory disease, retinopathy and patient remains at a high risk for more acute and chronic complications of diabetes which include CAD, CVA, CKD, retinopathy, and neuropathy. These are all discussed in detail with the patient.  - I have counseled the patient on diet management and weight loss, by adopting a carbohydrate restricted/protein rich diet.  -  Suggestion is made for patient to avoid simple carbohydrates   from their diet including Cakes , Desserts, Ice Cream,  Soda (  diet and regular) , Sweet Tea , Candies,  Chips, Cookies, Artificial Sweeteners,   and "Sugar-free" Products . This will help patient to have stable blood glucose profile and potentially avoid unintended weight gain.  - I encouraged the patient to switch to  unprocessed or minimally processed complex starch and increased protein intake (animal or plant source), fruits, and vegetables.  - Patient is advised to stick to a routine mealtimes to eat 3 meals  a day and avoid unnecessary snacks ( to snack only to correct hypoglycemia).  - The patient will be scheduled  with Jearld Fenton, RDN, CDE for individualized DM education.  - I have approached patient with the following individualized plan to manage diabetes and patient agrees:   - I  will continue with basal insulin Levemir 46 units QHS,  associated with strict monitoring of glucose  before breakfast and at bedtime .  -Based on her  blood glucose readings she will not need   prandial insulin for now.   - Patient is warned not to take insulin without proper monitoring per orders.  -Patient is encouraged to call clinic for blood glucose levels less than 70 or above 300 mg /dl. - I will continue metformin 500 mg by mouth twice a day and Januvia 50 mg by mouth daily therapeutically suitable for patient.  - Patient specific target  A1c;  LDL, HDL, Triglycerides, and  Waist Circumference were discussed in detail.  2) BP/HTN: Controlled. Continue current medications including ACEI/ARB. 3) Lipids/HPL:  Control unknown, continue statins. 4)  Weight/Diet: CDE Consult will be initiated , exercise, and detailed carbohydrates information provided.  5) Chronic Care/Health Maintenance:  -Patient  on ACEI/ARB and Statin medications and encouraged to continue to follow up with Ophthalmology, Podiatrist at least yearly or according to recommendations, and advised to   stay away from smoking. I have recommended yearly flu vaccine and pneumonia vaccination at least every 5 years; moderate intensity exercise for up to 150 minutes weekly; and  sleep for at least 7 hours a day.  - 25 minutes of time was spent on the care of this patient , 50% of which was applied for counseling on diabetes complications and their preventions.  - Patient to bring meter and  blood glucose logs during their next visit.   - I advised patient to maintain close follow up with [facility], MD for primary care needs.  Follow up plan: - Return in about 3 months (around 04/03/2016) for diabetes, high blood pressure, high cholesterol, follow  up with pre-visit labs, meter, and logs.  Glade Lloyd, MD Phone: [phone]  Fax: [phone]   01/03/2016, 11:56 AM

## 2016-01-03 NOTE — Patient Instructions (Signed)

## 2016-01-03 NOTE — Patient Instructions (Signed)
Goals 1. Increase exercise to 15-20 minutes a day. 2. Eat meals at times discussed.

## 2016-01-03 NOTE — Progress Notes (Signed)
  Medical Nutrition Therapy:  Appt start time: 1200 end time:  1230.  Assessment:  Primary concerns today: Diabetes. Lost 3 lbs. Quit drinking sodas and diet sodas.  A`C down to 8.3% from 10%. Feels better. Eating more regular meals and avoiding snacks. Levemir 46 units, Metformin 500 mg BID  Diet is much better.   Lab Results  Component Value Date   HGBA1C 8.3* 12/27/2015    Preferred Learning Style:   No preference indicated   Learning Readiness:   Ready  Change in progress   MEDICATIONS: None   DIETARY INTAKE:   24-hr recall:  B ( AM): 1 c bran flakes, 1/2 banaa, 1 c milk, Snk ( AM):   L ( PM): Grilled fish, green beans and slaw, water  Snk ( PM):  D ( PM):Grilled chicken, green beans, slaw water, Snk ( PM): misc snacks. Water sometimes. Beverages: soda and water some  Usual physical activity: ADL   Estimated energy needs: 1200-1500  calories 135 g carbohydrates 90 g protein 33 g fat  Progress Towards Goal(s):  In progress.   Nutritional Diagnosis:  NB-1.1 Food and nutrition-related knowledge deficit As related to DM.  As evidenced by A1C >10%..    Intervention:  Nutrition and Diabetes education provided on My Plate, CHO counting, meal planning, portion sizes, timing of meals, avoiding snacks between meals unless having a low blood sugar, target ranges for A1C and blood sugars, signs/symptoms and treatment of hyper/hypoglycemia, monitoring blood sugars, taking medications as prescribed, benefits of exercising 30 minutes per day and prevention of complications of DM. Marland Kitchen   Goals 1. Increase exercise to 15-20 minutes a day. 2. Eat meals at times discussed.  Teaching Method Utilized:  Visual Auditory Hands on  Handouts given during visit include:  My Plate Method  Meal Plan Card  Diabetes Instructions.   Barriers to learning/adherence to lifestyle change: None  Demonstrated degree of understanding via:  Teach Back   Monitoring/Evaluation:   Dietary intake, exercise, meal planning, SBG, and body weight in 3 month(s).

## 2016-04-04 LAB — CMP14+EGFR
ALT: 12 IU/L (ref 0–32)
AST: 19 IU/L (ref 0–40)
Albumin/Globulin Ratio: 1.2 (ref 1.2–2.2)
Albumin: 3.6 g/dL (ref 3.5–4.8)
Alkaline Phosphatase: 82 IU/L (ref 39–117)
BUN / CREAT RATIO: 18 (ref 12–28)
BUN: 14 mg/dL (ref 8–27)
Bilirubin Total: 0.4 mg/dL (ref 0.0–1.2)
CALCIUM: 9.6 mg/dL (ref 8.7–10.3)
CO2: 26 mmol/L (ref 18–29)
Chloride: 106 mmol/L (ref 96–106)
Creatinine, Ser: 0.77 mg/dL (ref 0.57–1.00)
GFR calc non Af Amer: 74 mL/min/{1.73_m2} (ref >59)
GFR, EST AFRICAN AMERICAN: 86 mL/min/{1.73_m2} (ref >59)
Globulin, Total: 2.9 g/dL (ref 1.5–4.5)
Glucose: 180 mg/dL — ABNORMAL HIGH (ref 65–99)
Potassium: 4 mmol/L (ref 3.5–5.2)
Sodium: 144 mmol/L (ref 134–144)
TOTAL PROTEIN: 6.5 g/dL (ref 6.0–8.5)

## 2016-04-04 LAB — HEMOGLOBIN A1C
Est. average glucose Bld gHb Est-mCnc: 194 mg/dL
Hgb A1c MFr Bld: 8.4 % — ABNORMAL HIGH (ref 4.8–5.6)

## 2016-04-04 LAB — T4, FREE: Free T4: 1.35 ng/dL (ref 0.82–1.77)

## 2016-04-04 LAB — T3, FREE: T3, Free: 3.3 pg/mL (ref 2.0–4.4)

## 2016-04-04 LAB — TSH: TSH: 0.026 u[IU]/mL — ABNORMAL LOW (ref 0.450–4.500)

## 2016-04-10 ENCOUNTER — Encounter: Payer: Self-pay | Admitting: "Endocrinology

## 2016-04-10 ENCOUNTER — Encounter: Payer: Self-pay | Admitting: Nutrition

## 2016-04-10 ENCOUNTER — Encounter: Payer: Medicare HMO | Attending: "Endocrinology | Admitting: Nutrition

## 2016-04-10 ENCOUNTER — Ambulatory Visit (INDEPENDENT_AMBULATORY_CARE_PROVIDER_SITE_OTHER): Payer: Medicare HMO | Admitting: "Endocrinology

## 2016-04-10 VITALS — Ht 67.0 in | Wt 175.0 lb

## 2016-04-10 VITALS — BP 146/83 | HR 65 | Ht 67.0 in | Wt 175.0 lb

## 2016-04-10 DIAGNOSIS — E1159 Type 2 diabetes mellitus with other circulatory complications: Secondary | ICD-10-CM | POA: Diagnosis not present

## 2016-04-10 DIAGNOSIS — Z713 Dietary counseling and surveillance: Secondary | ICD-10-CM | POA: Insufficient documentation

## 2016-04-10 DIAGNOSIS — E0590 Thyrotoxicosis, unspecified without thyrotoxic crisis or storm: Secondary | ICD-10-CM

## 2016-04-10 DIAGNOSIS — I10 Essential (primary) hypertension: Secondary | ICD-10-CM | POA: Diagnosis not present

## 2016-04-10 DIAGNOSIS — E785 Hyperlipidemia, unspecified: Secondary | ICD-10-CM | POA: Diagnosis not present

## 2016-04-10 DIAGNOSIS — IMO0002 Reserved for concepts with insufficient information to code with codable children: Secondary | ICD-10-CM

## 2016-04-10 DIAGNOSIS — Z794 Long term (current) use of insulin: Secondary | ICD-10-CM

## 2016-04-10 DIAGNOSIS — E118 Type 2 diabetes mellitus with unspecified complications: Secondary | ICD-10-CM

## 2016-04-10 DIAGNOSIS — E1165 Type 2 diabetes mellitus with hyperglycemia: Secondary | ICD-10-CM

## 2016-04-10 MED ORDER — METHIMAZOLE 5 MG PO TABS: 5.0000 mg | ORAL_TABLET | Freq: Every day | ORAL | Status: DC

## 2016-04-10 NOTE — Patient Instructions (Addendum)
Goals 1. Follow PLate Method 2. Increase low carb vegetables. 3. Cut down on breads. Cut out honey bunches of oats. 3. Take 500 mg of Metformin at breakfast and 1 at night. 4. Finish Januvia pills and then discontinue per DR. Nida 5. Drink more water 6. Get A1C to 7.5%.

## 2016-04-10 NOTE — Patient Instructions (Signed)

## 2016-04-10 NOTE — Progress Notes (Signed)
Subjective:    Patient ID: Stephanie Orozco, female    DOB: 30-Jan-1936. Patient is being seen in f/u for management of diabetes requested by  [facility], MD  Past Medical History  Diagnosis Date  . Diabetes mellitus, type II ([place])   . Hypertension   . Hyperlipidemia   . Heart attack ([place])   . Heart failure [facility])    Past Surgical History  Procedure Laterality Date  . Coronary angioplasty with stent placement    . Abdominal hysterectomy    . Cystoscopy     Social History   Social History  . Marital Status: Married    Spouse Name: N/A  . Number of Children: N/A  . Years of Education: N/A   Social History Main Topics  . Smoking status: Never Smoker   . Smokeless tobacco: None  . Alcohol Use: No  . Drug Use: No  . Sexual Activity: Not Asked   Other Topics Concern  . None   Social History Narrative   Outpatient Encounter Prescriptions as of 04/10/2016  Medication Sig  . Insulin Glargine (TOUJEO SOLOSTAR) 300 UNIT/ML SOPN Inject 50 Units into the skin at bedtime.  Marland Kitchen aspirin 81 MG tablet Take 81 mg by mouth daily.  . calcium carbonate (OS-CAL) 1250 (500 Ca) MG chewable tablet Chew 1 tablet by mouth daily.  . fesoterodine (TOVIAZ) 8 MG TB24 tablet Take 8 mg by mouth daily.  Marland Kitchen gabapentin (NEURONTIN) 300 MG capsule Take 300 mg by mouth 3 (three) times daily.  . isosorbide mononitrate (IMDUR) 30 MG 24 hr tablet Take 30 mg by mouth daily.  Marland Kitchen losartan (COZAAR) 50 MG tablet Take 50 mg by mouth daily.  . metFORMIN (GLUCOPHAGE) 500 MG tablet Take 500 mg by mouth 2 (two) times daily after a meal.  . metoprolol (LOPRESSOR) 100 MG tablet Take 50 mg by mouth 2 (two) times daily.  . Multiple Vitamin (MULTIVITAMIN) capsule Take 1 capsule by mouth daily.  . pantoprazole (PROTONIX) 40 MG tablet Take 40 mg by mouth daily.  . simvastatin (ZOCOR) 10 MG tablet Take 10 mg by mouth daily.  . sitaGLIPtin (JANUVIA) 50 MG tablet Take 1 tablet (50 mg total) by mouth daily.  Marland Kitchen torsemide (DEMADEX)  20 MG tablet Take 10 mg by mouth daily.  Marland Kitchen warfarin (COUMADIN) 7.5 MG tablet Take 7.5 mg by mouth daily.  . [DISCONTINUED] Insulin Detemir (LEVEMIR FLEXPEN) 100 UNIT/ML Pen Inject 46 Units into the skin at bedtime.   No facility-administered encounter medications on file as of 04/10/2016.   ALLERGIES: No Known Allergies VACCINATION STATUS:  There is no immunization history on file for this patient.  Diabetes She presents for her follow-up diabetic visit. She has type 2 diabetes mellitus. Onset time: She was diagnosed at approximate age of 71 years. Her disease course has been stable. There are no hypoglycemic associated symptoms. Pertinent negatives for hypoglycemia include no confusion, headaches, pallor or seizures. Associated symptoms include fatigue, foot paresthesias and visual change. Pertinent negatives for diabetes include no chest pain, no polydipsia, no polyphagia and no polyuria. There are no hypoglycemic complications. Symptoms are improving. Diabetic complications include peripheral neuropathy and retinopathy. Risk factors for coronary artery disease include diabetes mellitus, dyslipidemia, hypertension, obesity and sedentary lifestyle. Current diabetic treatment includes insulin injections and oral agent (dual therapy). Her weight is decreasing steadily. She is following a generally unhealthy diet. When asked about meal planning, she reported none. Prior visit with dietitian: She will have a visit with a dietitian  today. Home blood sugar record trend: She did bring her log showing stable fasting glucose profile. Her breakfast blood glucose range is generally 140-180 mg/dl. Her dinner blood glucose range is generally 180-200 mg/dl. An ACE inhibitor/angiotensin II receptor blocker is being taken. Eye exam is current.  Hyperlipidemia This is a chronic problem. The current episode started more than 1 year ago. Pertinent negatives include no chest pain, myalgias or shortness of breath. Current  antihyperlipidemic treatment includes statins. Risk factors for coronary artery disease include dyslipidemia, diabetes mellitus, hypertension, obesity and a sedentary lifestyle.  Hypertension This is a chronic problem. The current episode started more than 1 year ago. Pertinent negatives include no chest pain, headaches, palpitations or shortness of breath. Risk factors for coronary artery disease include diabetes mellitus, dyslipidemia and sedentary lifestyle. Past treatments include angiotensin blockers. Hypertensive end-organ damage includes retinopathy.      Review of Systems  Constitutional: Positive for fatigue. Negative for fever, chills and unexpected weight change.  HENT: Negative for trouble swallowing and voice change.   Eyes: Negative for visual disturbance.  Respiratory: Negative for cough, shortness of breath and wheezing.   Cardiovascular: Negative for chest pain, palpitations and leg swelling.  Gastrointestinal: Negative for nausea, vomiting and diarrhea.  Endocrine: Negative for cold intolerance, heat intolerance, polydipsia, polyphagia and polyuria.  Musculoskeletal: Negative for myalgias and arthralgias.  Skin: Negative for color change, pallor, rash and wound.  Neurological: Negative for seizures and headaches.  Psychiatric/Behavioral: Negative for suicidal ideas and confusion.    Objective:    BP 146/83 mmHg  Pulse 65  Ht 5\' 7"  (1.702 m)  Wt 175 lb (79.379 kg)  BMI 27.40 kg/m2  Wt Readings from Last 3 Encounters:  04/10/16 175 lb (79.379 kg)  04/10/16 175 lb (79.379 kg)  01/03/16 172 lb (78.019 kg)    Physical Exam  Constitutional: She is oriented to person, place, and time. She appears well-developed.  She walks with a walker.  HENT:  Head: Normocephalic and atraumatic.  Eyes: EOM are normal.  Neck: Normal range of motion. Neck supple. No tracheal deviation present. No thyromegaly present.  Cardiovascular: Normal rate and regular rhythm.   Pulses:       Dorsalis pedis pulses are 0 on the right side, and 0 on the left side.       Posterior tibial pulses are 0 on the right side, and 0 on the left side.  Pulmonary/Chest: Effort normal and breath sounds normal.  Abdominal: Soft. Bowel sounds are normal. There is no tenderness. There is no guarding.  Musculoskeletal: Normal range of motion. She exhibits no edema.       Feet:  Neurological: She is alert and oriented to person, place, and time. She has normal reflexes. No cranial nerve deficit. Coordination normal.  Skin: Skin is warm and dry. No rash noted. No erythema. No pallor.  Psychiatric: She has a normal mood and affect. Judgment normal.   Assessment & Plan:   1. DM type 2 causing vascular disease ([place])   - Patient has currently uncontrolled symptomatic type 2 DM since  80 years of age,  with most recent A1c of 8.4%, Generally improving from 10.7 %. Recent labs reviewed.   Her diabetes is complicated by coronary artery disease, refractory disease, retinopathy and patient remains at a high risk for more acute and chronic complications of diabetes which include CAD, CVA, CKD, retinopathy, and neuropathy. These are all discussed in detail with the patient.  - I have counseled the patient  on diet management and weight loss, by adopting a carbohydrate restricted/protein rich diet.  - Suggestion is made for patient to avoid simple carbohydrates   from their diet including Cakes , Desserts, Ice Cream,  Soda (  diet and regular) , Sweet Tea , Candies,  Chips, Cookies, Artificial Sweeteners,   and "Sugar-free" Products . This will help patient to have stable blood glucose profile and potentially avoid unintended weight gain.  - I encouraged the patient to switch to  unprocessed or minimally processed complex starch and increased protein intake (animal or plant source), fruits, and vegetables.  - Patient is advised to stick to a routine mealtimes to eat 3 meals  a day and avoid unnecessary snacks (  to snack only to correct hypoglycemia).  - The patient will be scheduled with Jearld Fenton, RDN, CDE for individualized DM education.  - I have approached patient with the following individualized plan to manage diabetes and patient agrees:   - I  Will increase her Toujeo to 50 units QHS,  associated with strict monitoring of glucose  before breakfast and at bedtime .  -Based on her  blood glucose readings she will not need   prandial insulin for now.   - Patient is warned not to take insulin without proper monitoring per orders.  -Patient is encouraged to call clinic for blood glucose levels less than 70 or above 300 mg /dl. - I will continue metformin 500 mg by mouth twice a day and Januvia 50 mg by mouth daily therapeutically suitable for patient.  - Patient specific target  A1c;  LDL, HDL, Triglycerides, and  Waist Circumference were discussed in detail.  2) BP/HTN: Controlled. Continue current medications including ACEI/ARB. 3) Lipids/HPL:  Controlled, LDL 48, continue statins. 4)  Weight/Diet: CDE Consult will be initiated , exercise, and detailed carbohydrates information provided. 5) hyperthyroidism: She was observed to have significantly suppressed TSH associated with high normal thyroid hormones. She would not need ablative therapy however would benefit from low-dose Tapazole. I will initiate Tapazole 5 mg by mouth every morning. Side effects and precautions discussed with her. She will need a repeat thyroid function tests before next visit. 6) Chronic Care/Health Maintenance:  -Patient  on ACEI/ARB and Statin medications and encouraged to continue to follow up with Ophthalmology, Podiatrist at least yearly or according to recommendations, and advised to   stay away from smoking. I have recommended yearly flu vaccine and pneumonia vaccination at least every 5 years; moderate intensity exercise for up to 150 minutes weekly; and  sleep for at least 7 hours a day.  - 25 minutes of  time was spent on the care of this patient , 50% of which was applied for counseling on diabetes complications and their preventions.  - Patient to bring meter and  blood glucose logs during their next visit.   - I advised patient to maintain close follow up with [facility], MD for primary care needs.  Follow up plan: - Return in about 3 months (around 07/11/2016) for follow up with pre-visit labs, meter, and logs.  Glade Lloyd, MD Phone: [phone]  Fax: [phone]   04/10/2016, 1:59 PM

## 2016-04-10 NOTE — Progress Notes (Signed)
  Medical Nutrition Therapy:  Appt start time: 1200 end time:  1230.  Assessment:  Primary concerns today: Diabetes. Lost 3 lbs. Taking 50 units of Toujeo daily.  Going to stop taking Januvia.. Complains of diarrhea. Was taking 2 Metformin in am and  1 at night but suppose to be on only 500 mg BID. Eating better balanced meals. Still eats out a lot admits to eating more carbs than she should at some meals.and not exercising. Cut out diet sodas 99% of time.   Diet is much better.   Lab Results  Component Value Date   HGBA1C 8.4* 04/03/2016    Preferred Learning Style:   No preference indicated   Learning Readiness:   Ready  Change in progress   MEDICATIONS: None   DIETARY INTAKE:   24-hr recall:  B ( AM): 1 eggs or  Snk ( AM):   L ( PM): Grilled fish, green beans and slaw, water  Snk ( PM):  D ( PM):Grilled chicken, green beans, slaw water,   Snk ( PM):  Beverages: water  Usual physical activity: ADL   Estimated energy needs: 1200-1500  calories 135 g carbohydrates 90 g protein 33 g fat  Progress Towards Goal(s):  In progress.   Nutritional Diagnosis:  NB-1.1 Food and nutrition-related knowledge deficit As related to DM.  As evidenced by A1C >10%..    Intervention:  Nutrition and Diabetes education provided on My Plate, CHO counting, meal planning, portion sizes, timing of meals, avoiding snacks between meals unless having a low blood sugar, target ranges for A1C and blood sugars, signs/symptoms and treatment of hyper/hypoglycemia, monitoring blood sugars, taking medications as prescribed, benefits of exercising 30 minutes per day and prevention of complications of DM. Marland Kitchen   Goals 1. Follow PLate Method 2. Increase low carb vegetables. 3. Cut down on breads. Cut out honey bunches of oats. 3. Take 500 mg of Metformin at breakfast and 1 at night. 4. Finish Januvia pills and then discontinue per DR. Nida 5. Drink more water 6. Get A1C to 7.5%.  Teaching Method  Utilized:  Visual Auditory Hands on  Handouts given during visit include:  My Plate Method  Meal Plan Card  Diabetes Instructions.   Barriers to learning/adherence to lifestyle change: None  Demonstrated degree of understanding via:  Teach Back   Monitoring/Evaluation:  Dietary intake, exercise, meal planning, SBG, and body weight in 3 month(s).

## 2016-07-11 LAB — HEMOGLOBIN A1C
ESTIMATED AVERAGE GLUCOSE: 217 mg/dL
Hgb A1c MFr Bld: 9.2 % — ABNORMAL HIGH (ref 4.8–5.6)

## 2016-07-11 LAB — CMP14+EGFR
ALBUMIN: 3.8 g/dL (ref 3.5–4.8)
ALK PHOS: 81 IU/L (ref 39–117)
ALT: 17 IU/L (ref 0–32)
AST: 17 IU/L (ref 0–40)
Albumin/Globulin Ratio: 1.3 (ref 1.2–2.2)
BUN/Creatinine Ratio: 23 (ref 12–28)
BUN: 18 mg/dL (ref 8–27)
Bilirubin Total: 0.4 mg/dL (ref 0.0–1.2)
CO2: 27 mmol/L (ref 18–29)
CREATININE: 0.79 mg/dL (ref 0.57–1.00)
Calcium: 9.8 mg/dL (ref 8.7–10.3)
Chloride: 98 mmol/L (ref 96–106)
GFR calc Af Amer: 83 mL/min/{1.73_m2} (ref >59)
GFR calc non Af Amer: 72 mL/min/{1.73_m2} (ref >59)
GLUCOSE: 210 mg/dL — AB (ref 65–99)
Globulin, Total: 2.9 g/dL (ref 1.5–4.5)
Potassium: 3.9 mmol/L (ref 3.5–5.2)
Sodium: 142 mmol/L (ref 134–144)
Total Protein: 6.7 g/dL (ref 6.0–8.5)

## 2016-07-11 LAB — TSH: TSH: 2.32 u[IU]/mL (ref 0.450–4.500)

## 2016-07-11 LAB — T4, FREE: FREE T4: 0.91 ng/dL (ref 0.82–1.77)

## 2016-07-12 ENCOUNTER — Other Ambulatory Visit: Payer: Self-pay | Admitting: "Endocrinology

## 2016-07-17 ENCOUNTER — Encounter: Payer: Medicare HMO | Attending: "Endocrinology | Admitting: Nutrition

## 2016-07-17 ENCOUNTER — Encounter: Payer: Self-pay | Admitting: Nutrition

## 2016-07-17 ENCOUNTER — Ambulatory Visit (INDEPENDENT_AMBULATORY_CARE_PROVIDER_SITE_OTHER): Payer: Medicare HMO | Admitting: "Endocrinology

## 2016-07-17 ENCOUNTER — Ambulatory Visit: Payer: Medicare HMO | Admitting: "Endocrinology

## 2016-07-17 ENCOUNTER — Encounter: Payer: Self-pay | Admitting: "Endocrinology

## 2016-07-17 VITALS — Ht 67.0 in | Wt 177.0 lb

## 2016-07-17 VITALS — BP 167/96 | HR 77 | Ht 67.0 in | Wt 177.0 lb

## 2016-07-17 DIAGNOSIS — E118 Type 2 diabetes mellitus with unspecified complications: Secondary | ICD-10-CM

## 2016-07-17 DIAGNOSIS — E1165 Type 2 diabetes mellitus with hyperglycemia: Secondary | ICD-10-CM

## 2016-07-17 DIAGNOSIS — E0590 Thyrotoxicosis, unspecified without thyrotoxic crisis or storm: Secondary | ICD-10-CM | POA: Diagnosis not present

## 2016-07-17 DIAGNOSIS — Z713 Dietary counseling and surveillance: Secondary | ICD-10-CM | POA: Insufficient documentation

## 2016-07-17 DIAGNOSIS — I10 Essential (primary) hypertension: Secondary | ICD-10-CM

## 2016-07-17 DIAGNOSIS — E782 Mixed hyperlipidemia: Secondary | ICD-10-CM

## 2016-07-17 DIAGNOSIS — Z794 Long term (current) use of insulin: Secondary | ICD-10-CM

## 2016-07-17 DIAGNOSIS — E669 Obesity, unspecified: Secondary | ICD-10-CM

## 2016-07-17 DIAGNOSIS — IMO0002 Reserved for concepts with insufficient information to code with codable children: Secondary | ICD-10-CM

## 2016-07-17 DIAGNOSIS — E1159 Type 2 diabetes mellitus with other circulatory complications: Secondary | ICD-10-CM | POA: Insufficient documentation

## 2016-07-17 NOTE — Progress Notes (Signed)
Subjective:    Patient ID: Stephanie Orozco, female    DOB: Jan 25, 1936. Patient is being seen in f/u for management of diabetes requested by  [facility], MD  Past Medical History:  Diagnosis Date  . Diabetes mellitus, type II ([place])   . Heart attack   . Heart failure ([place])   . Hyperlipidemia   . Hypertension    Past Surgical History:  Procedure Laterality Date  . ABDOMINAL HYSTERECTOMY    . CORONARY ANGIOPLASTY WITH STENT PLACEMENT    . CYSTOSCOPY     Social History   Social History  . Marital status: Married    Spouse name: N/A  . Number of children: N/A  . Years of education: N/A   Social History Main Topics  . Smoking status: Never Smoker  . Smokeless tobacco: Never Used  . Alcohol use No  . Drug use: No  . Sexual activity: Not Asked   Other Topics Concern  . None   Social History Narrative  . None   Outpatient Encounter Prescriptions as of 07/17/2016  Medication Sig  . atorvastatin (LIPITOR) 40 MG tablet Take 40 mg by mouth daily.  Marland Kitchen linaclotide (LINZESS) 72 MCG capsule Take 72 mcg by mouth daily before breakfast.  . methimazole (TAPAZOLE) 5 MG tablet Take 5 mg by mouth daily.  . niacin 500 MG tablet Take 500 mg by mouth at bedtime.  Marland Kitchen aspirin 81 MG tablet Take 81 mg by mouth daily.  . calcium carbonate (OS-CAL) 1250 (500 Ca) MG chewable tablet Chew 1 tablet by mouth daily.  . fesoterodine (TOVIAZ) 8 MG TB24 tablet Take 8 mg by mouth daily.  Marland Kitchen gabapentin (NEURONTIN) 300 MG capsule Take 300 mg by mouth 3 (three) times daily.  . Insulin Glargine (TOUJEO SOLOSTAR) 300 UNIT/ML SOPN Inject 60 Units into the skin at bedtime.  . isosorbide mononitrate (IMDUR) 30 MG 24 hr tablet Take 30 mg by mouth daily.  Marland Kitchen losartan (COZAAR) 50 MG tablet Take 50 mg by mouth daily.  . metFORMIN (GLUCOPHAGE) 500 MG tablet Take 500 mg by mouth 2 (two) times daily after a meal.  . metoprolol (LOPRESSOR) 100 MG tablet Take 50 mg by mouth 2 (two) times daily.  . Multiple Vitamin  (MULTIVITAMIN) capsule Take 1 capsule by mouth daily.  . pantoprazole (PROTONIX) 40 MG tablet Take 40 mg by mouth daily.  . simvastatin (ZOCOR) 10 MG tablet Take 10 mg by mouth daily.  . sitaGLIPtin (JANUVIA) 50 MG tablet Take 1 tablet (50 mg total) by mouth daily.  Marland Kitchen torsemide (DEMADEX) 20 MG tablet Take 10 mg by mouth daily.  Marland Kitchen warfarin (COUMADIN) 7.5 MG tablet Take 7.5 mg by mouth daily.  . [DISCONTINUED] methimazole (TAPAZOLE) 5 MG tablet TAKE 1 TABLET BY MOUTH DAILY   No facility-administered encounter medications on file as of 07/17/2016.    ALLERGIES: No Known Allergies VACCINATION STATUS:  There is no immunization history on file for this patient.  Diabetes  She presents for her follow-up diabetic visit. She has type 2 diabetes mellitus. Onset time: She was diagnosed at approximate age of 28 years. Her disease course has been worsening. There are no hypoglycemic associated symptoms. Pertinent negatives for hypoglycemia include no confusion, headaches, pallor or seizures. Associated symptoms include fatigue, foot paresthesias and visual change. Pertinent negatives for diabetes include no chest pain, no polydipsia, no polyphagia and no polyuria. There are no hypoglycemic complications. Symptoms are worsening. Diabetic complications include peripheral neuropathy and retinopathy. Risk factors for coronary  artery disease include diabetes mellitus, dyslipidemia, hypertension, obesity and sedentary lifestyle. Current diabetic treatment includes insulin injections and oral agent (dual therapy). Her weight is stable. She is following a generally unhealthy diet. When asked about meal planning, she reported none. Prior visit with dietitian: She will have a visit with a dietitian today. Her breakfast blood glucose range is generally 180-200 mg/dl. Her dinner blood glucose range is generally 180-200 mg/dl. Her overall blood glucose range is 180-200 mg/dl. An ACE inhibitor/angiotensin II receptor blocker  is being taken. Eye exam is current.  Hyperlipidemia  This is a chronic problem. The current episode started more than 1 year ago. Pertinent negatives include no chest pain, myalgias or shortness of breath. Current antihyperlipidemic treatment includes statins. Risk factors for coronary artery disease include dyslipidemia, diabetes mellitus, hypertension, obesity and a sedentary lifestyle.  Hypertension  This is a chronic problem. The current episode started more than 1 year ago. Pertinent negatives include no chest pain, headaches, palpitations or shortness of breath. Risk factors for coronary artery disease include diabetes mellitus, dyslipidemia and sedentary lifestyle. Past treatments include angiotensin blockers. Hypertensive end-organ damage includes retinopathy.      Review of Systems  Constitutional: Positive for fatigue. Negative for chills, fever and unexpected weight change.  HENT: Negative for trouble swallowing and voice change.   Eyes: Negative for visual disturbance.  Respiratory: Negative for cough, shortness of breath and wheezing.   Cardiovascular: Negative for chest pain, palpitations and leg swelling.  Gastrointestinal: Negative for diarrhea, nausea and vomiting.  Endocrine: Negative for cold intolerance, heat intolerance, polydipsia, polyphagia and polyuria.  Musculoskeletal: Negative for arthralgias and myalgias.  Skin: Negative for color change, pallor, rash and wound.  Neurological: Negative for seizures and headaches.  Psychiatric/Behavioral: Negative for confusion and suicidal ideas.    Objective:    BP (!) 167/96   Pulse 77   Ht 5\' 7"  (1.702 m)   Wt 177 lb (80.3 kg)   BMI 27.72 kg/m   Wt Readings from Last 3 Encounters:  07/17/16 177 lb (80.3 kg)  04/10/16 175 lb (79.4 kg)  04/10/16 175 lb (79.4 kg)    Physical Exam  Constitutional: She is oriented to person, place, and time. She appears well-developed.  She walks with a walker.  HENT:  Head:  Normocephalic and atraumatic.  Eyes: EOM are normal.  Neck: Normal range of motion. Neck supple. No tracheal deviation present. No thyromegaly present.  Cardiovascular: Normal rate and regular rhythm.   Pulses:      Dorsalis pedis pulses are 0 on the right side, and 0 on the left side.       Posterior tibial pulses are 0 on the right side, and 0 on the left side.  Pulmonary/Chest: Effort normal and breath sounds normal.  Abdominal: Soft. Bowel sounds are normal. There is no tenderness. There is no guarding.  Musculoskeletal: Normal range of motion. She exhibits no edema.       Feet:  Left leg ulcer status post 20 days of antibiotics. Following  up with her primary care doctor.  Neurological: She is alert and oriented to person, place, and time. She has normal reflexes. No cranial nerve deficit. Coordination normal.  Skin: Skin is warm and dry. No rash noted. No erythema. No pallor.  Psychiatric: She has a normal mood and affect. Judgment normal.   Assessment & Plan:   1. DM type 2 causing vascular disease ([place])  - Patient has currently uncontrolled symptomatic type 2 DM since  80  years of age,  with most recent A1c of 9.2% increasing from 8.4%. Her A1c has generally improved from 10.7%. Recent labs reviewed.   Her diabetes is complicated by coronary artery disease, refractory disease, retinopathy and patient remains at a high risk for more acute and chronic complications of diabetes which include CAD, CVA, CKD, retinopathy, and neuropathy. These are all discussed in detail with the patient.  - I have counseled the patient on diet management and weight loss, by adopting a carbohydrate restricted/protein rich diet.  - Suggestion is made for patient to avoid simple carbohydrates   from their diet including Cakes , Desserts, Ice Cream,  Soda (  diet and regular) , Sweet Tea , Candies,  Chips, Cookies, Artificial Sweeteners,   and "Sugar-free" Products . This will help patient to have stable  blood glucose profile and potentially avoid unintended weight gain.  - I encouraged the patient to switch to  unprocessed or minimally processed complex starch and increased protein intake (animal or plant source), fruits, and vegetables.  - Patient is advised to stick to a routine mealtimes to eat 3 meals  a day and avoid unnecessary snacks ( to snack only to correct hypoglycemia).  - The patient will be scheduled with Jearld Fenton, RDN, CDE for individualized DM education.  - I have approached patient with the following individualized plan to manage diabetes and patient agrees:   - I  Will increase her Toujeo to 60 units QHS,  associated with strict monitoring of glucose  before breakfast and at bedtime .  - She may need prandial insulin if she cannot control glycemia and/or if A1c remains high during her next Visit.  - Patient is warned not to take insulin without proper monitoring per orders.  -Patient is encouraged to call clinic for blood glucose levels less than 70 or above 300 mg /dl. - I will continue metformin 500 mg by mouth twice a day and Januvia 50 mg by mouth daily therapeutically suitable for patient.  - Patient specific target  A1c;  LDL, HDL, Triglycerides, and  Waist Circumference were discussed in detail.  2) BP/HTN: uncontrolled.  She has enough medications and I advised her to continue  current medications including ACEI/ARB. She is advised to limit salt consumption. 3) Lipids/HPL:  Controlled, LDL 48, continue statins. 4)  Weight/Diet: CDE Consult will be initiated , exercise, and detailed carbohydrates information provided. 5) hyperthyroidism: She has responded to metformin monotherapy. Her thyroid function tests are within normal limits now. I will discontinue methimazole and repeat thyroid function test before her next visit.  6) Chronic Care/Health Maintenance:  -Patient  on ACEI/ARB and Statin medications and encouraged to continue to follow up with  Ophthalmology, Podiatrist at least yearly or according to recommendations, and advised to   stay away from smoking. I have recommended yearly flu vaccine and pneumonia vaccination at least every 5 years; moderate intensity exercise for up to 150 minutes weekly; and  sleep for at least 7 hours a day.  - 25 minutes of time was spent on the care of this patient , 50% of which was applied for counseling on diabetes complications and their preventions.  - Patient to bring meter and  blood glucose logs during their next visit.   - I advised patient to maintain close follow up with [facility], MD for primary care needs.  Follow up plan: - Return in about 3 months (around 10/17/2016) for follow up with pre-visit labs, meter, and logs.  Glade Lloyd,  MD Phone: [phone]  Fax: [phone]   07/17/2016, 2:36 PM

## 2016-07-17 NOTE — Patient Instructions (Signed)

## 2016-07-17 NOTE — Progress Notes (Signed)
  Medical Nutrition Therapy:  Appt start time: [id number] end time:  1500  Assessment:  Primary concerns today: Diabetes Type 2 DM. Saw Dr. Dorris Fetch today. A1C 9.2%, up from 8.4%.. Dr. Dorris Fetch increased  Toujeo to  60 units. Still on  Metformin 500 mg BID. Has been on vacation and says she got off her schedule of eating. Tends to sleep late in am and only eat 2 meals per day.  Has sore on her left front leg that is wrapped.  PCP Dr. Manuella Ghazi   Diet is inconsistent to meet her needs and control her diabetes.    Lab Results  Component Value Date   HGBA1C 9.2 (H) 07/10/2016    Preferred Learning Style:   No preference indicated   Learning Readiness:   Ready  Change in progress   MEDICATIONS: None   DIETARY INTAKE:   24-hr recall:  B ( AM):  Wheat chex and bran flake with milk and 1/2 banana, decaf coffee and water Snk ( AM):   L ( PM): Grilled fish, green beans and slaw, water  Snk ( PM):  D ( PM):Grilled chicken, green beans, slaw water,   Snk ( PM):  Beverages: water  Usual physical activity: ADL   Estimated energy needs: 1200-1500  calories 135 g carbohydrates 90 g protein 33 g fat  Progress Towards Goal(s):  In progress.   Nutritional Diagnosis:  NB-1.1 Food and nutrition-related knowledge deficit As related to DM.  As evidenced by A1C >10%..    Intervention:  Nutrition and Diabetes education provided on My Plate, CHO counting, meal planning, portion sizes, timing of meals, avoiding snacks between meals unless having a low blood sugar, target ranges for A1C and blood sugars, signs/symptoms and treatment of hyper/hypoglycemia, monitoring blood sugars, taking medications as prescribed, benefits of exercising 30 minutes per day and prevention of complications of DM. Stressed need to control blood sugar and get A1C down to allow wound on left leg to heal.  .  Goals 1. Eat three meals a day on time. Breakfast by 9 am Lumch 12-2 pm and dinner 5-7 pm.  2. NO snacks between meals. 3.  Drink only water 4. Take medications as prescribed. Make sure you take Metformin after breakfast  Get A1C down to 8% in three months  Teaching Method Utilized:  Visual Auditory Hands on  Handouts given during visit include:  My Plate Method  Meal Plan Card  Diabetes Instructions.   Barriers to learning/adherence to lifestyle change: None  Demonstrated degree of understanding via:  Teach Back   Monitoring/Evaluation:  Dietary intake, exercise, meal planning, SBG, and body weight in 3 month(s).

## 2016-07-17 NOTE — Patient Instructions (Signed)
Goals 1. Eat three meals a day on time. Breakfast by 9 am Lumch 12-2 pm and dinner 5-7 pm.  2. NO snacks between meals. 3. Drink only water 4. Take medications as prescribed. Make sure you take Metformin after breakfast  Get A1C down to 8% in three months

## 2016-07-31 ENCOUNTER — Encounter (HOSPITAL_BASED_OUTPATIENT_CLINIC_OR_DEPARTMENT_OTHER): Payer: Medicare HMO | Attending: Surgery

## 2016-07-31 DIAGNOSIS — I110 Hypertensive heart disease with heart failure: Secondary | ICD-10-CM | POA: Insufficient documentation

## 2016-07-31 DIAGNOSIS — E11622 Type 2 diabetes mellitus with other skin ulcer: Secondary | ICD-10-CM | POA: Insufficient documentation

## 2016-07-31 DIAGNOSIS — Z7982 Long term (current) use of aspirin: Secondary | ICD-10-CM | POA: Diagnosis not present

## 2016-07-31 DIAGNOSIS — K219 Gastro-esophageal reflux disease without esophagitis: Secondary | ICD-10-CM | POA: Insufficient documentation

## 2016-07-31 DIAGNOSIS — I890 Lymphedema, not elsewhere classified: Secondary | ICD-10-CM | POA: Insufficient documentation

## 2016-07-31 DIAGNOSIS — I87313 Chronic venous hypertension (idiopathic) with ulcer of bilateral lower extremity: Secondary | ICD-10-CM | POA: Diagnosis not present

## 2016-07-31 DIAGNOSIS — I509 Heart failure, unspecified: Secondary | ICD-10-CM | POA: Diagnosis not present

## 2016-07-31 DIAGNOSIS — E785 Hyperlipidemia, unspecified: Secondary | ICD-10-CM | POA: Insufficient documentation

## 2016-07-31 DIAGNOSIS — Z794 Long term (current) use of insulin: Secondary | ICD-10-CM | POA: Insufficient documentation

## 2016-07-31 DIAGNOSIS — Z7901 Long term (current) use of anticoagulants: Secondary | ICD-10-CM | POA: Insufficient documentation

## 2016-07-31 DIAGNOSIS — E1140 Type 2 diabetes mellitus with diabetic neuropathy, unspecified: Secondary | ICD-10-CM | POA: Insufficient documentation

## 2016-07-31 DIAGNOSIS — I252 Old myocardial infarction: Secondary | ICD-10-CM | POA: Insufficient documentation

## 2016-07-31 DIAGNOSIS — E1165 Type 2 diabetes mellitus with hyperglycemia: Secondary | ICD-10-CM | POA: Insufficient documentation

## 2016-07-31 DIAGNOSIS — L97821 Non-pressure chronic ulcer of other part of left lower leg limited to breakdown of skin: Secondary | ICD-10-CM | POA: Diagnosis not present

## 2016-07-31 DIAGNOSIS — Z79899 Other long term (current) drug therapy: Secondary | ICD-10-CM | POA: Diagnosis not present

## 2016-07-31 DIAGNOSIS — Z7984 Long term (current) use of oral hypoglycemic drugs: Secondary | ICD-10-CM | POA: Insufficient documentation

## 2016-07-31 DIAGNOSIS — Z955 Presence of coronary angioplasty implant and graft: Secondary | ICD-10-CM | POA: Diagnosis not present

## 2016-08-07 ENCOUNTER — Ambulatory Visit (HOSPITAL_COMMUNITY)
Admission: RE | Admit: 2016-08-07 | Discharge: 2016-08-07 | Disposition: A | Discharge status: Home / Self Care | Payer: Medicare HMO | Source: Ambulatory Visit | Attending: Vascular Surgery | Admitting: Vascular Surgery

## 2016-08-07 ENCOUNTER — Other Ambulatory Visit: Payer: Self-pay | Admitting: Surgery

## 2016-08-07 ENCOUNTER — Encounter (HOSPITAL_BASED_OUTPATIENT_CLINIC_OR_DEPARTMENT_OTHER): Payer: Medicare HMO

## 2016-08-07 DIAGNOSIS — E11622 Type 2 diabetes mellitus with other skin ulcer: Secondary | ICD-10-CM | POA: Diagnosis not present

## 2016-08-07 DIAGNOSIS — L97309 Non-pressure chronic ulcer of unspecified ankle with unspecified severity: Secondary | ICD-10-CM | POA: Diagnosis not present

## 2016-08-07 DIAGNOSIS — I872 Venous insufficiency (chronic) (peripheral): Secondary | ICD-10-CM | POA: Diagnosis not present

## 2016-08-07 DIAGNOSIS — L97909 Non-pressure chronic ulcer of unspecified part of unspecified lower leg with unspecified severity: Secondary | ICD-10-CM | POA: Diagnosis not present

## 2016-08-12 ENCOUNTER — Other Ambulatory Visit: Payer: Self-pay | Admitting: Surgery

## 2016-08-12 ENCOUNTER — Other Ambulatory Visit: Payer: Self-pay | Admitting: "Endocrinology

## 2016-08-12 DIAGNOSIS — S81809A Unspecified open wound, unspecified lower leg, initial encounter: Secondary | ICD-10-CM

## 2016-08-13 ENCOUNTER — Ambulatory Visit (HOSPITAL_COMMUNITY)
Admission: RE | Admit: 2016-08-13 | Discharge: 2016-08-13 | Disposition: A | Discharge status: Home / Self Care | Payer: Medicare HMO | Source: Ambulatory Visit | Attending: Vascular Surgery | Admitting: Vascular Surgery

## 2016-08-13 ENCOUNTER — Encounter (HOSPITAL_COMMUNITY): Payer: Self-pay

## 2016-08-13 ENCOUNTER — Ambulatory Visit (INDEPENDENT_AMBULATORY_CARE_PROVIDER_SITE_OTHER)
Admission: RE | Admit: 2016-08-13 | Discharge: 2016-08-13 | Disposition: A | Discharge status: Home / Self Care | Payer: Medicare HMO | Source: Ambulatory Visit | Attending: Vascular Surgery | Admitting: Vascular Surgery

## 2016-08-13 DIAGNOSIS — X58XXXA Exposure to other specified factors, initial encounter: Secondary | ICD-10-CM | POA: Insufficient documentation

## 2016-08-13 DIAGNOSIS — S81809A Unspecified open wound, unspecified lower leg, initial encounter: Secondary | ICD-10-CM | POA: Insufficient documentation

## 2016-08-13 DIAGNOSIS — R9439 Abnormal result of other cardiovascular function study: Secondary | ICD-10-CM | POA: Insufficient documentation

## 2016-08-21 DIAGNOSIS — E11622 Type 2 diabetes mellitus with other skin ulcer: Secondary | ICD-10-CM | POA: Diagnosis not present

## 2016-09-03 ENCOUNTER — Ambulatory Visit (INDEPENDENT_AMBULATORY_CARE_PROVIDER_SITE_OTHER): Payer: Medicare HMO | Admitting: Vascular Surgery

## 2016-09-03 ENCOUNTER — Encounter: Payer: Self-pay | Admitting: Vascular Surgery

## 2016-09-03 VITALS — BP 140/80 | HR 76 | Temp 97.3°F | Resp 16 | Ht 67.0 in | Wt 167.2 lb

## 2016-09-03 DIAGNOSIS — I83893 Varicose veins of bilateral lower extremities with other complications: Secondary | ICD-10-CM | POA: Diagnosis not present

## 2016-09-03 NOTE — Progress Notes (Signed)
Subjective:     Patient ID: Stephanie Orozco, female   DOB: 04-13-1936, 80 y.o.   MRN: [id number]  HPI This 80 year old female was referred by Dr. Con Memos from the wound center for evaluation of bilateral venous insufficiency with history of bilateral stasis ulcers. Patient has had ulcerations in the lower third of both lower extremities in the past which have healed with compression and medication. Most recent was on the left side. She also has a remote history of a possible DVT although she is unclear about this. She has been on Coumadin for many years because of cardiac stents. She developed a "hematoma" after back surgery which required re-operation and she thinks she may have had a blood clot a few days later in her legs but is not certain. She has had chronic edema for many years. She where short leg elastic compression stockings on occasion. She develops swelling in the legs as the day progresses and develops heaviness aching and throbbing as well. She has noticed a darkening of the skin worsening over the past several years.  Past Medical History:  Diagnosis Date  . Diabetes mellitus, type II ([place])   . Heart attack   . Heart failure ([place])   . Hyperlipidemia   . Hypertension     Social History  Substance Use Topics  . Smoking status: Never Smoker  . Smokeless tobacco: Never Used  . Alcohol use No    Family History  Problem Relation Age of Onset  . Heart attack Mother   . Hypertension Father   . Hyperlipidemia Father   . Hyperlipidemia Sister     Allergies  Allergen Reactions  . Lasix [Furosemide]      Current Outpatient Prescriptions:  .  aspirin 81 MG tablet, Take 81 mg by mouth daily., Disp: , Rfl:  .  atorvastatin (LIPITOR) 40 MG tablet, Take 40 mg by mouth daily., Disp: , Rfl:  .  calcium carbonate (OS-CAL) 1250 (500 Ca) MG chewable tablet, Chew 1 tablet by mouth daily., Disp: , Rfl:  .  fesoterodine (TOVIAZ) 8 MG TB24 tablet, Take 8 mg by mouth daily., Disp: , Rfl:  .   gabapentin (NEURONTIN) 300 MG capsule, Take 300 mg by mouth 3 (three) times daily., Disp: , Rfl:  .  Insulin Glargine (TOUJEO SOLOSTAR) 300 UNIT/ML SOPN, Inject 60 Units into the skin at bedtime., Disp: , Rfl:  .  isosorbide mononitrate (IMDUR) 30 MG 24 hr tablet, Take 30 mg by mouth daily., Disp: , Rfl:  .  linaclotide (LINZESS) 72 MCG capsule, Take 72 mcg by mouth daily before breakfast., Disp: , Rfl:  .  losartan (COZAAR) 50 MG tablet, Take 50 mg by mouth daily., Disp: , Rfl:  .  metFORMIN (GLUCOPHAGE) 500 MG tablet, Take 500 mg by mouth 2 (two) times daily after a meal., Disp: , Rfl:  .  methimazole (TAPAZOLE) 5 MG tablet, TAKE 1 TABLET BY MOUTH DAILY, Disp: 30 tablet, Rfl: 3 .  metoprolol (LOPRESSOR) 100 MG tablet, Take 50 mg by mouth 2 (two) times daily., Disp: , Rfl:  .  Multiple Vitamin (MULTIVITAMIN) capsule, Take 1 capsule by mouth daily., Disp: , Rfl:  .  niacin 500 MG tablet, Take 500 mg by mouth at bedtime., Disp: , Rfl:  .  pantoprazole (PROTONIX) 40 MG tablet, Take 40 mg by mouth daily., Disp: , Rfl:  .  simvastatin (ZOCOR) 10 MG tablet, Take 10 mg by mouth daily., Disp: , Rfl:  .  sitaGLIPtin (JANUVIA) 50 MG  tablet, Take 1 tablet (50 mg total) by mouth daily., Disp: 30 tablet, Rfl: 3 .  torsemide (DEMADEX) 20 MG tablet, Take 10 mg by mouth daily., Disp: , Rfl:  .  warfarin (COUMADIN) 7.5 MG tablet, Take 7.5 mg by mouth daily., Disp: , Rfl:   Vitals:   09/03/16 0947 09/03/16 0950  BP: (!) 153/79 140/80  Pulse: 76   Resp: 16   Temp: 97.3 F (36.3 C)   TempSrc: Oral   SpO2: 95%   Weight: 167 lb 3.2 oz (75.8 kg)   Height: 5\' 7"  (1.702 m)     Body mass index is 26.19 kg/m.         Review of Systems She has history of coronary artery disease with previous PTCA and stenting 5 performed at [facility] for plus years ago. Also history of type 1 diabetes mellitus, hyperlipidemia. All other systems negative and a complete review of systems    Objective:    Physical Exam BP 140/80 (BP Location: Left Arm) Comment: recheck  Pulse 76   Temp 97.3 F (36.3 C) (Oral)   Resp 16   Ht 5\' 7"  (1.702 m)   Wt 167 lb 3.2 oz (75.8 kg)   SpO2 95%   BMI 26.19 kg/m     Gen.-alert and oriented x3 in no apparent distress HEENT normal for age Lungs no rhonchi or wheezing Cardiovascular regular rhythm no murmurs carotid pulses 3+ palpable no bruits audible Abdomen soft nontender no palpable masses Musculoskeletal free of  major deformities Skin clear -no rashes Neurologic normal Lower extremities 3+ femoral and dorsalis pedis pulses palpable bilaterally with 1+ edema bilaterally. Severe hyperpigmentation lower third bilateral lower extremities circumferential with no active ulceration at present time. No bulging varicosities noted. 3+ dorsalis pedis pulse palpable bilaterally.  Patient had a venous duplex exam performed in our office 08/13/2016 which I have reviewed. Also performed a bedside SonoSite ultrasound exam today for confirmation Patient has bilateral large caliber great saphenous veins with reflux throughout. There is a possibility of an old DVT in the right distal thigh with recanalization although this is unclear because of body habitus. No DVT on the left.       Assessment:     Bilateral severe reflux great saphenous veins with history of bilateral healed venous stasis ulcers-CEAP 5 Coronary artery disease status post PTCA and stenting 5 on chronic Coumadin therapy Type 1 diabetes mellitus Lipidemia Hypertension    Plan:         #1 long leg elastic compression stockings 20-30 mm gradient #2 elevate legs as much as possible #3 ibuprofen daily on a regular basis for pain #4 return in 3 months-if no significant improvement then patient will require staged bilateral laser ablation great saphenous veins hopefully to stabilize skin changes and prevent further venous stasis ulceration and improve edema Return in 3 months

## 2016-09-23 HISTORY — PX: HERNIA REPAIR: SHX51

## 2016-10-14 ENCOUNTER — Encounter: Payer: Medicare HMO | Admitting: Vascular Surgery

## 2016-10-17 LAB — CMP14+EGFR
ALBUMIN: 3.6 g/dL (ref 3.5–4.8)
ALK PHOS: 84 IU/L (ref 39–117)
ALT: 16 IU/L (ref 0–32)
AST: 18 IU/L (ref 0–40)
Albumin/Globulin Ratio: 1.2 (ref 1.2–2.2)
BILIRUBIN TOTAL: 0.5 mg/dL (ref 0.0–1.2)
BUN / CREAT RATIO: 19 (ref 12–28)
BUN: 12 mg/dL (ref 8–27)
CHLORIDE: 102 mmol/L (ref 96–106)
CO2: 28 mmol/L (ref 18–29)
CREATININE: 0.64 mg/dL (ref 0.57–1.00)
Calcium: 9.8 mg/dL (ref 8.7–10.3)
GFR calc Af Amer: 99 mL/min/{1.73_m2} (ref >59)
GFR calc non Af Amer: 86 mL/min/{1.73_m2} (ref >59)
GLOBULIN, TOTAL: 2.9 g/dL (ref 1.5–4.5)
GLUCOSE: 182 mg/dL — AB (ref 65–99)
POTASSIUM: 3.9 mmol/L (ref 3.5–5.2)
SODIUM: 143 mmol/L (ref 134–144)
Total Protein: 6.5 g/dL (ref 6.0–8.5)

## 2016-10-17 LAB — TSH: TSH: 0.058 u[IU]/mL — AB (ref 0.450–4.500)

## 2016-10-17 LAB — HEMOGLOBIN A1C
ESTIMATED AVERAGE GLUCOSE: 243 mg/dL
HEMOGLOBIN A1C: 10.1 % — AB (ref 4.8–5.6)

## 2016-10-17 LAB — T4, FREE: Free T4: 1.38 ng/dL (ref 0.82–1.77)

## 2016-10-23 ENCOUNTER — Encounter: Payer: Medicare HMO | Attending: "Endocrinology | Admitting: Nutrition

## 2016-10-23 ENCOUNTER — Encounter: Payer: Self-pay | Admitting: "Endocrinology

## 2016-10-23 ENCOUNTER — Ambulatory Visit (INDEPENDENT_AMBULATORY_CARE_PROVIDER_SITE_OTHER): Payer: Medicare HMO | Admitting: "Endocrinology

## 2016-10-23 VITALS — Wt 172.0 lb

## 2016-10-23 VITALS — BP 136/84 | HR 63 | Ht 67.0 in | Wt 173.0 lb

## 2016-10-23 DIAGNOSIS — E0590 Thyrotoxicosis, unspecified without thyrotoxic crisis or storm: Secondary | ICD-10-CM

## 2016-10-23 DIAGNOSIS — E1159 Type 2 diabetes mellitus with other circulatory complications: Secondary | ICD-10-CM | POA: Diagnosis not present

## 2016-10-23 DIAGNOSIS — I10 Essential (primary) hypertension: Secondary | ICD-10-CM | POA: Diagnosis not present

## 2016-10-23 DIAGNOSIS — E118 Type 2 diabetes mellitus with unspecified complications: Secondary | ICD-10-CM

## 2016-10-23 DIAGNOSIS — E669 Obesity, unspecified: Secondary | ICD-10-CM

## 2016-10-23 DIAGNOSIS — Z713 Dietary counseling and surveillance: Secondary | ICD-10-CM | POA: Insufficient documentation

## 2016-10-23 DIAGNOSIS — E782 Mixed hyperlipidemia: Secondary | ICD-10-CM | POA: Diagnosis not present

## 2016-10-23 NOTE — Progress Notes (Signed)
Subjective:    Patient ID: Stephanie Orozco, female    DOB: 09-28-1935. Patient is being seen in f/u for management of diabetes requested by  [facility], MD  Past Medical History:  Diagnosis Date  . Diabetes mellitus, type II ([place])   . Heart attack   . Heart failure ([place])   . Hyperlipidemia   . Hypertension    Past Surgical History:  Procedure Laterality Date  . ABDOMINAL HYSTERECTOMY    . CORONARY ANGIOPLASTY WITH STENT PLACEMENT    . CYSTOSCOPY     Social History   Social History  . Marital status: Married    Spouse name: N/A  . Number of children: N/A  . Years of education: N/A   Social History Main Topics  . Smoking status: Never Smoker  . Smokeless tobacco: Never Used  . Alcohol use No  . Drug use: No  . Sexual activity: Not Asked   Other Topics Concern  . None   Social History Narrative  . None   Outpatient Encounter Prescriptions as of 10/23/2016  Medication Sig  . acetaminophen (TYLENOL) 500 MG tablet Take 500 mg by mouth every 6 (six) hours as needed.  Marland Kitchen aspirin 81 MG tablet Take 81 mg by mouth daily.  Marland Kitchen atorvastatin (LIPITOR) 40 MG tablet Take 40 mg by mouth daily.  . calcium carbonate (OS-CAL) 1250 (500 Ca) MG chewable tablet Chew 1 tablet by mouth daily.  . fesoterodine (TOVIAZ) 8 MG TB24 tablet Take 8 mg by mouth daily.  Marland Kitchen gabapentin (NEURONTIN) 300 MG capsule Take 300 mg by mouth 3 (three) times daily.  . Insulin Glargine (TOUJEO SOLOSTAR) 300 UNIT/ML SOPN Inject 60 Units into the skin at bedtime.  . isosorbide mononitrate (IMDUR) 30 MG 24 hr tablet Take 30 mg by mouth daily.  Marland Kitchen linaclotide (LINZESS) 72 MCG capsule Take 72 mcg by mouth daily before breakfast.  . losartan (COZAAR) 50 MG tablet Take 50 mg by mouth daily.  . metFORMIN (GLUCOPHAGE) 500 MG tablet Take 500 mg by mouth 2 (two) times daily after a meal.  . methimazole (TAPAZOLE) 5 MG tablet TAKE 1 TABLET BY MOUTH DAILY  . metoprolol (LOPRESSOR) 100 MG tablet Take 50 mg by mouth 2 (two)  times daily.  . Multiple Vitamin (MULTIVITAMIN) capsule Take 1 capsule by mouth daily.  . niacin 500 MG tablet Take 500 mg by mouth at bedtime.  . pantoprazole (PROTONIX) 40 MG tablet Take 40 mg by mouth daily.  . simvastatin (ZOCOR) 10 MG tablet Take 10 mg by mouth daily.  Marland Kitchen torsemide (DEMADEX) 20 MG tablet Take 10 mg by mouth daily.  Marland Kitchen warfarin (COUMADIN) 7.5 MG tablet Take 7.5 mg by mouth daily.  . [DISCONTINUED] sitaGLIPtin (JANUVIA) 50 MG tablet Take 1 tablet (50 mg total) by mouth daily.   No facility-administered encounter medications on file as of 10/23/2016.    ALLERGIES: Allergies  Allergen Reactions  . Lasix [Furosemide]    VACCINATION STATUS:  There is no immunization history on file for this patient.  Diabetes  She presents for her follow-up diabetic visit. She has type 2 diabetes mellitus. Onset time: She was diagnosed at approximate age of 43 years. Her disease course has been worsening. There are no hypoglycemic associated symptoms. Pertinent negatives for hypoglycemia include no confusion, headaches, pallor or seizures. Associated symptoms include fatigue, foot paresthesias and visual change. Pertinent negatives for diabetes include no chest pain, no polydipsia, no polyphagia and no polyuria. There are no hypoglycemic complications. Symptoms  are worsening. Diabetic complications include peripheral neuropathy and retinopathy. Risk factors for coronary artery disease include diabetes mellitus, dyslipidemia, hypertension, obesity and sedentary lifestyle. Current diabetic treatment includes insulin injections and oral agent (dual therapy). Her weight is stable. She is following a generally unhealthy diet. When asked about meal planning, she reported none. Prior visit with dietitian: She will have a visit with a dietitian today. Her breakfast blood glucose range is generally 180-200 mg/dl. Her dinner blood glucose range is generally 180-200 mg/dl. Her overall blood glucose range is  180-200 mg/dl. An ACE inhibitor/angiotensin II receptor blocker is being taken. Eye exam is current.  Hyperlipidemia  This is a chronic problem. The current episode started more than 1 year ago. Pertinent negatives include no chest pain, myalgias or shortness of breath. Current antihyperlipidemic treatment includes statins. Risk factors for coronary artery disease include dyslipidemia, diabetes mellitus, hypertension, obesity and a sedentary lifestyle.  Hypertension  This is a chronic problem. The current episode started more than 1 year ago. Pertinent negatives include no chest pain, headaches, palpitations or shortness of breath. Risk factors for coronary artery disease include diabetes mellitus, dyslipidemia and sedentary lifestyle. Past treatments include angiotensin blockers. Hypertensive end-organ damage includes retinopathy.      Review of Systems  Constitutional: Positive for fatigue. Negative for chills, fever and unexpected weight change.  HENT: Negative for trouble swallowing and voice change.   Eyes: Negative for visual disturbance.  Respiratory: Negative for cough, shortness of breath and wheezing.   Cardiovascular: Negative for chest pain, palpitations and leg swelling.  Gastrointestinal: Negative for diarrhea, nausea and vomiting.  Endocrine: Negative for cold intolerance, heat intolerance, polydipsia, polyphagia and polyuria.  Musculoskeletal: Negative for arthralgias and myalgias.  Skin: Negative for color change, pallor, rash and wound.  Neurological: Negative for seizures and headaches.  Psychiatric/Behavioral: Negative for confusion and suicidal ideas.    Objective:    BP 136/84   Pulse 63   Ht _0  (1.702 m)   Wt 173 lb (78.5 kg)   BMI 27.10 kg/m   Wt Readings from Last 3 Encounters:  10/23/16 172 lb (78 kg)  10/23/16 173 lb (78.5 kg)  09/03/16 167 lb 3.2 oz (75.8 kg)    Physical Exam  Constitutional: She is oriented to person, place, and time. She appears  well-developed.  She walks with a walker.  HENT:  Head: Normocephalic and atraumatic.  Eyes: EOM are normal.  Neck: Normal range of motion. Neck supple. No tracheal deviation present. No thyromegaly present.  Cardiovascular: Normal rate and regular rhythm.   Pulses:      Dorsalis pedis pulses are 0 on the right side, and 0 on the left side.       Posterior tibial pulses are 0 on the right side, and 0 on the left side.  Pulmonary/Chest: Effort normal and breath sounds normal.  Abdominal: Soft. Bowel sounds are normal. There is no tenderness. There is no guarding.  Musculoskeletal: Normal range of motion. She exhibits no edema.       Feet:  Left leg ulcer status post 20 days of antibiotics. Following  up with her primary care doctor.  Neurological: She is alert and oriented to person, place, and time. She has normal reflexes. No cranial nerve deficit. Coordination normal.  Skin: Skin is warm and dry. No rash noted. No erythema. No pallor.  Psychiatric: She has a normal mood and affect. Judgment normal.    Recent Results (from the past 2160 hour(s))  Hemoglobin A1c  Status: Abnormal   Collection Time: 10/16/16 12:25 PM  Result Value Ref Range   Hgb A1c MFr Bld 10.1 (H) 4.8 - 5.6 %    Comment:          Pre-diabetes: 5.7 - 6.4          Diabetes: >6.4          Glycemic control for adults with diabetes: <7.0    Est. average glucose Bld gHb Est-mCnc 243 mg/dL  TSH     Status: Abnormal   Collection Time: 10/16/16 12:25 PM  Result Value Ref Range   TSH 0.058 (L) 0.450 - 4.500 uIU/mL  T4, free     Status: None   Collection Time: 10/16/16 12:25 PM  Result Value Ref Range   Free T4 1.38 0.82 - 1.77 ng/dL  CMP14+EGFR     Status: Abnormal   Collection Time: 10/16/16 12:25 PM  Result Value Ref Range   Glucose 182 (H) 65 - 99 mg/dL   BUN 12 8 - 27 mg/dL   Creatinine, Ser 0.64 0.57 - 1.00 mg/dL   GFR calc non Af Amer 86 >59 mL/min/1.73   GFR calc Af Amer 99 >59 mL/min/1.73    BUN/Creatinine Ratio 19 12 - 28   Sodium 143 134 - 144 mmol/L   Potassium 3.9 3.5 - 5.2 mmol/L   Chloride 102 96 - 106 mmol/L   CO2 28 18 - 29 mmol/L   Calcium 9.8 8.7 - 10.3 mg/dL   Total Protein 6.5 6.0 - 8.5 g/dL   Albumin 3.6 3.5 - 4.8 g/dL   Globulin, Total 2.9 1.5 - 4.5 g/dL   Albumin/Globulin Ratio 1.2 1.2 - 2.2   Bilirubin Total 0.5 0.0 - 1.2 mg/dL   Alkaline Phosphatase 84 39 - 117 IU/L   AST 18 0 - 40 IU/L   ALT 16 0 - 32 IU/L    Assessment & Plan:   1. DM type 2 causing vascular disease (H[facility])  - Patient has currently uncontrolled symptomatic type 2 DM since  81 years of age. - She came with loss of control of diabetes. She came  with  A1c of  10.1% increasing from 9.2% .  increasing from 8.4%. Recent labs reviewed.   Her diabetes is complicated by coronary artery disease, refractory disease, retinopathy and patient remains at a high risk for more acute and chronic complications of diabetes which include CAD, CVA, CKD, retinopathy, and neuropathy. These are all discussed in detail with the patient.  - I have counseled the patient on diet management and weight loss, by adopting a carbohydrate restricted/protein rich diet.  - Suggestion is made for patient to avoid simple carbohydrates   from their diet including Cakes , Desserts, Ice Cream,  Soda (  diet and regular) , Sweet Tea , Candies,  Chips, Cookies, Artificial Sweeteners,   and "Sugar-free" Products . This will help patient to have stable blood glucose profile and potentially avoid unintended weight gain.  - I encouraged the patient to switch to  unprocessed or minimally processed complex starch and increased protein intake (animal or plant source), fruits, and vegetables.  - Patient is advised to stick to a routine mealtimes to eat 3 meals  a day and avoid unnecessary snacks ( to snack only to correct hypoglycemia).  - The patient will be scheduled with Jearld Fenton, RDN, CDE for individualized DM education.  -  I have approached patient with the following individualized plan to manage diabetes and patient agrees:   -  It is time for her to require basal/bolus insulin. However, she is hesitant to engage for proper monitoring.  - I approached her to start strict monitoring of blood glucose 4 times a day-before meals and at bedtime and return in 2 weeks with her meter and logs - In the meantime, I  Will increase her Toujeo to 60 units QHS. - Patient is warned not to take insulin without proper monitoring per orders.  -Patient is encouraged to call clinic for blood glucose levels less than 70 or above 300 mg /dl. - I will continue metformin 500 mg by mouth twice a day and d/c Januvia. - Patient specific target  A1c;  LDL, HDL, Triglycerides, and  Waist Circumference were discussed in detail.  2) BP/HTN: uncontrolled.  She has enough medications and I advised her to continue  current medications including ACEI/ARB. She is advised to limit salt consumption. 3) Lipids/HPL:  Controlled, LDL 48, continue statins. 4)  Weight/Diet: CDE Consult will be initiated , exercise, and detailed carbohydrates information provided. 5) hyperthyroidism: She has responded to metformin monotherapy. Her thyroid function tests are within normal limits now. I will discontinue methimazole and repeat thyroid function test before her next visit.  6) Chronic Care/Health Maintenance:  -Patient  on ACEI/ARB and Statin medications and encouraged to continue to follow up with Ophthalmology, Podiatrist at least yearly or according to recommendations, and advised to   stay away from smoking. I have recommended yearly flu vaccine and pneumonia vaccination at least every 5 years; moderate intensity exercise for up to 150 minutes weekly; and  sleep for at least 7 hours a day.  - 25 minutes of time was spent on the care of this patient , 50% of which was applied for counseling on diabetes complications and their preventions.  - Patient to  bring meter and  blood glucose logs during their next visit.   - I advised patient to maintain close follow up with [facility], MD for primary care needs.  Follow up plan: - Return in about 2 weeks (around 11/06/2016) for follow up with meter and logs- no labs.  Glade Lloyd, MD Phone: [phone]  Fax: [phone]   10/23/2016, 3:32 PM

## 2016-10-23 NOTE — Patient Instructions (Signed)

## 2016-10-23 NOTE — Progress Notes (Signed)
  Medical Nutrition Therapy:  Appt start time: [id number] end time:  1500  Assessment:  Primary concerns today: Diabetes Type 2 DM. Saw Dr. Dorris Fetch today.  A1C up to 10.1% from 9.2%. Problems with her right shoulder. Getting ready to get therapy for it. Stays in pain. She notes she feels like she is depressed. Doesn't have much family; only nieces. Has a female friend that helps with her meals.    Meds: 60 units Toujeo and Metformin 500mg  BID. Was only taking 50 units of Toujeo up until the last week or two and BS are better now on 60 units per day.    Has been given Tylenol for pain but hasn't been taking it routinely and therefore doesn't sleep due to shoulder pain.  Sometimes skips a meals because she doesn't feel like eating. Has been snacking between meals at times. Limited mobility.       She appears depressed and may benefit from counseling or medications.  Lab Results  Component Value Date   HGBA1C 10.1 (H) 10/16/2016    Preferred Learning Style:   No preference indicated   Learning Readiness:   Ready  Change in progress   MEDICATIONS: None   DIETARY INTAKE:   24-hr recall:  B) Shredded wheat or bran flakes  L)  Skipped D) CHicken livers, creamed potatoes/gravy,  Toss salad, water  Usual physical activity: ADL   Estimated energy needs: 1200-1500  calories 135 g carbohydrates 90 g protein 33 g fat  Progress Towards Goal(s):  In progress.   Nutritional Diagnosis:  NB-1.1 Food and nutrition-related knowledge deficit As related to DM.  As evidenced by A1C >10%..    Intervention:  Nutrition and Diabetes education provided on My Plate, CHO counting, meal planning, portion sizes, timing of meals, avoiding snacks between meals unless having a low blood sugar, target ranges for A1C and blood sugars, signs/symptoms and treatment of hyper/hypoglycemia, monitoring blood sugars, taking medications as prescribed, benefits of exercising 30 minutes per day and prevention of complications  of DM. Stressed need to control blood sugar and get A1C down to allow wound on left leg to heal.    Goals 1. Eat meals on time 2. Drink more water  4-5 bottles per day 3. Don't skip meals 4. Try Healthy Choice or Smart Ones for meals or Glucerna or Carnation Instant Breakfast. 5. Take meds as prescribed Try not to eat past 7 pm Get A1C down to 8%  Teaching Method Utilized:  Visual Auditory Hands on  Handouts given during visit include:  My Plate Method  Meal Plan Card  Diabetes Instructions.   Barriers to learning/adherence to lifestyle change: None  Demonstrated degree of understanding via:  Teach Back   Monitoring/Evaluation:  Dietary intake, exercise, meal planning, SBG, and body weight in 2 weeks.      She appears depressed and may benefit from counseling or medications.

## 2016-10-23 NOTE — Patient Instructions (Addendum)
Goals 1. Eat meals on time 2. Drink more water  4-5 bottles per day 3. Don't skip meals 4. Try Healthy Choice or Smart Ones for meals or Glucerna or Carnation Instant Breakfast. 5. Take meds as prescribed Try not to eat past 7 pm Get A1C down to 8%

## 2016-11-11 ENCOUNTER — Ambulatory Visit: Payer: Medicare HMO | Admitting: Nutrition

## 2016-11-11 ENCOUNTER — Ambulatory Visit: Payer: Medicare HMO | Admitting: "Endocrinology

## 2016-11-22 ENCOUNTER — Encounter: Payer: Self-pay | Admitting: Vascular Surgery

## 2016-11-26 ENCOUNTER — Ambulatory Visit (INDEPENDENT_AMBULATORY_CARE_PROVIDER_SITE_OTHER): Payer: Medicare HMO | Admitting: "Endocrinology

## 2016-11-26 ENCOUNTER — Encounter: Payer: Self-pay | Admitting: "Endocrinology

## 2016-11-26 VITALS — BP 137/77 | HR 76 | Ht 67.0 in | Wt 175.0 lb

## 2016-11-26 DIAGNOSIS — E1159 Type 2 diabetes mellitus with other circulatory complications: Secondary | ICD-10-CM

## 2016-11-26 DIAGNOSIS — E782 Mixed hyperlipidemia: Secondary | ICD-10-CM | POA: Diagnosis not present

## 2016-11-26 DIAGNOSIS — E0590 Thyrotoxicosis, unspecified without thyrotoxic crisis or storm: Secondary | ICD-10-CM | POA: Diagnosis not present

## 2016-11-26 DIAGNOSIS — I10 Essential (primary) hypertension: Secondary | ICD-10-CM | POA: Diagnosis not present

## 2016-11-26 MED ORDER — INSULIN ASPART 100 UNIT/ML FLEXPEN: 8.0000 [IU] | PEN_INJECTOR | Freq: Three times a day (TID) | SUBCUTANEOUS | 2 refills | Status: DC

## 2016-11-26 NOTE — Progress Notes (Signed)
Subjective:    Patient ID: Stephanie Orozco, female    DOB: 08/22/1936. Patient is being seen in f/u for management of diabetes requested by  [facility], MD  Past Medical History:  Diagnosis Date  . Diabetes mellitus, type II ([place])   . Heart attack   . Heart failure ([place])   . Hyperlipidemia   . Hypertension    Past Surgical History:  Procedure Laterality Date  . ABDOMINAL HYSTERECTOMY    . CORONARY ANGIOPLASTY WITH STENT PLACEMENT    . CYSTOSCOPY     Social History   Social History  . Marital status: Married    Spouse name: N/A  . Number of children: N/A  . Years of education: N/A   Social History Main Topics  . Smoking status: Never Smoker  . Smokeless tobacco: Never Used  . Alcohol use No  . Drug use: No  . Sexual activity: Not Asked   Other Topics Concern  . None   Social History Narrative  . None   Outpatient Encounter Prescriptions as of 11/26/2016  Medication Sig  . acetaminophen (TYLENOL) 500 MG tablet Take 500 mg by mouth every 6 (six) hours as needed.  Marland Kitchen aspirin 81 MG tablet Take 81 mg by mouth daily.  Marland Kitchen atorvastatin (LIPITOR) 40 MG tablet Take 40 mg by mouth daily.  . calcium carbonate (OS-CAL) 1250 (500 Ca) MG chewable tablet Chew 1 tablet by mouth daily.  . fesoterodine (TOVIAZ) 8 MG TB24 tablet Take 8 mg by mouth daily.  Marland Kitchen gabapentin (NEURONTIN) 300 MG capsule Take 300 mg by mouth 3 (three) times daily.  . insulin aspart (NOVOLOG) 100 UNIT/ML FlexPen Inject 8-14 Units into the skin 3 (three) times daily with meals.  . Insulin Glargine (TOUJEO SOLOSTAR) 300 UNIT/ML SOPN Inject 60 Units into the skin at bedtime.  . isosorbide mononitrate (IMDUR) 30 MG 24 hr tablet Take 30 mg by mouth daily.  Marland Kitchen linaclotide (LINZESS) 72 MCG capsule Take 72 mcg by mouth daily before breakfast.  . losartan (COZAAR) 50 MG tablet Take 50 mg by mouth daily.  . metFORMIN (GLUCOPHAGE) 500 MG tablet Take 500 mg by mouth 2 (two) times daily after a meal.  . metoprolol  (LOPRESSOR) 100 MG tablet Take 50 mg by mouth 2 (two) times daily.  . Multiple Vitamin (MULTIVITAMIN) capsule Take 1 capsule by mouth daily.  . niacin 500 MG tablet Take 500 mg by mouth at bedtime.  . pantoprazole (PROTONIX) 40 MG tablet Take 40 mg by mouth daily.  . simvastatin (ZOCOR) 10 MG tablet Take 10 mg by mouth daily.  Marland Kitchen torsemide (DEMADEX) 20 MG tablet Take 10 mg by mouth daily.  Marland Kitchen warfarin (COUMADIN) 7.5 MG tablet Take 7.5 mg by mouth daily.  . [DISCONTINUED] methimazole (TAPAZOLE) 5 MG tablet TAKE 1 TABLET BY MOUTH DAILY (Patient not taking: Reported on 11/26/2016)   No facility-administered encounter medications on file as of 11/26/2016.    ALLERGIES: Allergies  Allergen Reactions  . Lasix [Furosemide]    VACCINATION STATUS:  There is no immunization history on file for this patient.  Diabetes  She presents for her follow-up diabetic visit. She has type 2 diabetes mellitus. Onset time: She was diagnosed at approximate age of 9 years. Her disease course has been worsening. There are no hypoglycemic associated symptoms. Pertinent negatives for hypoglycemia include no confusion, headaches, pallor or seizures. Associated symptoms include fatigue, foot paresthesias and visual change. Pertinent negatives for diabetes include no chest pain, no polydipsia, no  polyphagia and no polyuria. There are no hypoglycemic complications. Symptoms are worsening. Diabetic complications include peripheral neuropathy and retinopathy. Risk factors for coronary artery disease include diabetes mellitus, dyslipidemia, hypertension, obesity and sedentary lifestyle. Current diabetic treatment includes insulin injections and oral agent (dual therapy). Her weight is stable. She is following a generally unhealthy diet. When asked about meal planning, she reported none. Prior visit with dietitian: She will have a visit with a dietitian today. Her breakfast blood glucose range is generally 140-180 mg/dl. Her lunch blood  glucose range is generally 180-200 mg/dl. Her dinner blood glucose range is generally >200 mg/dl. Her overall blood glucose range is >200 mg/dl. An ACE inhibitor/angiotensin II receptor blocker is being taken. Eye exam is current.  Hyperlipidemia  This is a chronic problem. The current episode started more than 1 year ago. Pertinent negatives include no chest pain, myalgias or shortness of breath. Current antihyperlipidemic treatment includes statins. Risk factors for coronary artery disease include dyslipidemia, diabetes mellitus, hypertension, obesity and a sedentary lifestyle.  Hypertension  This is a chronic problem. The current episode started more than 1 year ago. Pertinent negatives include no chest pain, headaches, palpitations or shortness of breath. Risk factors for coronary artery disease include diabetes mellitus, dyslipidemia and sedentary lifestyle. Past treatments include angiotensin blockers. Hypertensive end-organ damage includes retinopathy.      Review of Systems  Constitutional: Positive for fatigue. Negative for chills, fever and unexpected weight change.  HENT: Negative for trouble swallowing and voice change.   Eyes: Negative for visual disturbance.  Respiratory: Negative for cough, shortness of breath and wheezing.   Cardiovascular: Negative for chest pain, palpitations and leg swelling.  Gastrointestinal: Negative for diarrhea, nausea and vomiting.  Endocrine: Negative for cold intolerance, heat intolerance, polydipsia, polyphagia and polyuria.  Musculoskeletal: Negative for arthralgias and myalgias.  Skin: Negative for color change, pallor, rash and wound.  Neurological: Negative for seizures and headaches.  Psychiatric/Behavioral: Negative for confusion and suicidal ideas.    Objective:    BP 137/77   Pulse 76   Ht '5\' 7"'$  (1.702 m)   Wt 175 lb (79.4 kg)   BMI 27.41 kg/m   Wt Readings from Last 3 Encounters:  11/26/16 175 lb (79.4 kg)  10/23/16 172 lb (78 kg)   10/23/16 173 lb (78.5 kg)    Physical Exam  Constitutional: She is oriented to person, place, and time. She appears well-developed.  She walks with a walker.  HENT:  Head: Normocephalic and atraumatic.  Eyes: EOM are normal.  Neck: Normal range of motion. Neck supple. No tracheal deviation present. No thyromegaly present.  Cardiovascular: Normal rate and regular rhythm.   Pulses:      Dorsalis pedis pulses are 0 on the right side, and 0 on the left side.       Posterior tibial pulses are 0 on the right side, and 0 on the left side.  Pulmonary/Chest: Effort normal and breath sounds normal.  Abdominal: Soft. Bowel sounds are normal. There is no tenderness. There is no guarding.  Musculoskeletal: Normal range of motion. She exhibits no edema.       Feet:  Left leg ulcer status post 20 days of antibiotics. Following  up with her primary care doctor.  Neurological: She is alert and oriented to person, place, and time. She has normal reflexes. No cranial nerve deficit. Coordination normal.  Skin: Skin is warm and dry. No rash noted. No erythema. No pallor.  Psychiatric: She has a normal mood and  affect. Judgment normal.    Recent Results (from the past 2160 hour(s))  Hemoglobin A1c     Status: Abnormal   Collection Time: 10/16/16 12:25 PM  Result Value Ref Range   Hgb A1c MFr Bld 10.1 (H) 4.8 - 5.6 %    Comment:          Pre-diabetes: 5.7 - 6.4          Diabetes: >6.4          Glycemic control for adults with diabetes: <7.0    Est. average glucose Bld gHb Est-mCnc 243 mg/dL  TSH     Status: Abnormal   Collection Time: 10/16/16 12:25 PM  Result Value Ref Range   TSH 0.058 (L) 0.450 - 4.500 uIU/mL  T4, free     Status: None   Collection Time: 10/16/16 12:25 PM  Result Value Ref Range   Free T4 1.38 0.82 - 1.77 ng/dL  CMP14+EGFR     Status: Abnormal   Collection Time: 10/16/16 12:25 PM  Result Value Ref Range   Glucose 182 (H) 65 - 99 mg/dL   BUN 12 8 - 27 mg/dL   Creatinine,  Ser 0.64 0.57 - 1.00 mg/dL   GFR calc non Af Amer 86 >59 mL/min/1.73   GFR calc Af Amer 99 >59 mL/min/1.73   BUN/Creatinine Ratio 19 12 - 28   Sodium 143 134 - 144 mmol/L   Potassium 3.9 3.5 - 5.2 mmol/L   Chloride 102 96 - 106 mmol/L   CO2 28 18 - 29 mmol/L   Calcium 9.8 8.7 - 10.3 mg/dL   Total Protein 6.5 6.0 - 8.5 g/dL   Albumin 3.6 3.5 - 4.8 g/dL   Globulin, Total 2.9 1.5 - 4.5 g/dL   Albumin/Globulin Ratio 1.2 1.2 - 2.2   Bilirubin Total 0.5 0.0 - 1.2 mg/dL   Alkaline Phosphatase 84 39 - 117 IU/L   AST 18 0 - 40 IU/L   ALT 16 0 - 32 IU/L    Assessment & Plan:   1. DM type 2 causing vascular disease ([facility])  - Patient has currently uncontrolled symptomatic type 2 DM since  81 years of age. - She came with loss of control of diabetes. She came  with  A1c of  10.1% increasing from 9.2% .  increasing from 8.4%. Recent labs reviewed.   Her diabetes is complicated by coronary artery disease, refractory disease, retinopathy and patient remains at a high risk for more acute and chronic complications of diabetes which include CAD, CVA, CKD, retinopathy, and neuropathy. These are all discussed in detail with the patient.  - I have counseled the patient on diet management and weight loss, by adopting a carbohydrate restricted/protein rich diet.  - Suggestion is made for patient to avoid simple carbohydrates   from their diet including Cakes , Desserts, Ice Cream,  Soda (  diet and regular) , Sweet Tea , Candies,  Chips, Cookies, Artificial Sweeteners,   and "Sugar-free" Products . This will help patient to have stable blood glucose profile and potentially avoid unintended weight gain.  - I encouraged the patient to switch to  unprocessed or minimally processed complex starch and increased protein intake (animal or plant source), fruits, and vegetables.  - Patient is advised to stick to a routine mealtimes to eat 3 meals  a day and avoid unnecessary snacks ( to snack only to correct  hypoglycemia).  - The patient will be scheduled with Jearld Fenton, RDN, CDE for individualized  DM education.  - I have approached patient with the following individualized plan to manage diabetes and patient agrees:   -  It is time for her to require basal/bolus insulin. - I approached her to continue strict monitoring of blood glucose 4 times a day-before meals and at bedtime and return in 2 weeks with her meter and logs - I  will continue Toujeo 60 units daily at bedtime, initiate NovoLog 8 units 3 times a day before meals for female blood glucose between 90 and 150 mg/dL plus correction for pre-meal blood glucose above 115 mg/dL - Patient is warned not to take insulin without proper monitoring per orders.  -Patient is encouraged to call clinic for blood glucose levels less than 70 or above 300 mg /dl. - I will continue metformin 500 mg by mouth twice a day. - Patient specific target  A1c;  LDL, HDL, Triglycerides, and  Waist Circumference were discussed in detail.  2) BP/HTN: uncontrolled.  She has enough medications and I advised her to continue  current medications including ACEI/ARB. She is advised to limit salt consumption. 3) Lipids/HPL:  Controlled, LDL 48, continue statins. 4)  Weight/Diet: CDE Consult will be initiated , exercise, and detailed carbohydrates information provided. 5) hyperthyroidism: She has responded to methimazole. Her thyroid function tests are within normal limits now. I will discontinue methimazole and repeat thyroid function test before her next visit.  6) Chronic Care/Health Maintenance:  -Patient  on ACEI/ARB and Statin medications and encouraged to continue to follow up with Ophthalmology, Podiatrist at least yearly or according to recommendations, and advised to   stay away from smoking. I have recommended yearly flu vaccine and pneumonia vaccination at least every 5 years; moderate intensity exercise for up to 150 minutes weekly; and  sleep for at least 7  hours a day.  - 35 minutes of time was spent on the care of this patient , 50% of which was applied for counseling on diabetes complications and their preventions.  - Patient to bring meter and  blood glucose logs during their next visit.   - I advised patient to maintain close follow up with [facility], MD for primary care needs.  Follow up plan: - Return in about 2 weeks (around 12/10/2016) for follow up with meter and logs- no labs.  Glade Lloyd, MD Phone: [phone]  Fax: [phone]   11/26/2016, 3:59 PM

## 2016-12-02 ENCOUNTER — Ambulatory Visit: Payer: Medicare HMO | Admitting: Vascular Surgery

## 2016-12-10 ENCOUNTER — Ambulatory Visit: Payer: Medicare HMO | Admitting: "Endocrinology

## 2016-12-11 ENCOUNTER — Telehealth: Payer: Self-pay

## 2016-12-11 NOTE — Telephone Encounter (Signed)
Pt missed Toujeo last night. She wanted to know if she could take it along with her novolog this am. I told her to just take her novolog as normal and wait to take her Toujeo at regular time tonight.

## 2016-12-31 ENCOUNTER — Ambulatory Visit (INDEPENDENT_AMBULATORY_CARE_PROVIDER_SITE_OTHER): Payer: Medicare HMO | Admitting: "Endocrinology

## 2016-12-31 ENCOUNTER — Encounter: Payer: Self-pay | Admitting: "Endocrinology

## 2016-12-31 VITALS — BP 143/73 | HR 69 | Ht 67.0 in | Wt 180.0 lb

## 2016-12-31 DIAGNOSIS — I10 Essential (primary) hypertension: Secondary | ICD-10-CM

## 2016-12-31 DIAGNOSIS — E1159 Type 2 diabetes mellitus with other circulatory complications: Secondary | ICD-10-CM | POA: Diagnosis not present

## 2016-12-31 DIAGNOSIS — E782 Mixed hyperlipidemia: Secondary | ICD-10-CM | POA: Diagnosis not present

## 2016-12-31 NOTE — Progress Notes (Signed)
Subjective:    Patient ID: Stephanie Orozco, female    DOB: 21-Jul-1936. Patient is being seen in f/u for management of diabetes requested by  [facility], MD  Past Medical History:  Diagnosis Date  . Diabetes mellitus, type II ([place])   . Heart attack   . Heart failure ([place])   . Hyperlipidemia   . Hypertension    Past Surgical History:  Procedure Laterality Date  . ABDOMINAL HYSTERECTOMY    . CORONARY ANGIOPLASTY WITH STENT PLACEMENT    . CYSTOSCOPY     Social History   Social History  . Marital status: Married    Spouse name: N/A  . Number of children: N/A  . Years of education: N/A   Social History Main Topics  . Smoking status: Never Smoker  . Smokeless tobacco: Never Used  . Alcohol use No  . Drug use: No  . Sexual activity: Not Asked   Other Topics Concern  . None   Social History Narrative  . None   Outpatient Encounter Prescriptions as of 12/31/2016  Medication Sig  . acetaminophen (TYLENOL) 500 MG tablet Take 500 mg by mouth every 6 (six) hours as needed.  Marland Kitchen aspirin 81 MG tablet Take 81 mg by mouth daily.  Marland Kitchen atorvastatin (LIPITOR) 40 MG tablet Take 40 mg by mouth daily.  . calcium carbonate (OS-CAL) 1250 (500 Ca) MG chewable tablet Chew 1 tablet by mouth daily.  . fesoterodine (TOVIAZ) 8 MG TB24 tablet Take 8 mg by mouth daily.  Marland Kitchen gabapentin (NEURONTIN) 300 MG capsule Take 300 mg by mouth 3 (three) times daily.  . insulin aspart (NOVOLOG) 100 UNIT/ML FlexPen Inject 8-14 Units into the skin 3 (three) times daily with meals.  . Insulin Glargine (TOUJEO SOLOSTAR) 300 UNIT/ML SOPN Inject 50 Units into the skin at bedtime.  . isosorbide mononitrate (IMDUR) 30 MG 24 hr tablet Take 30 mg by mouth daily.  Marland Kitchen linaclotide (LINZESS) 72 MCG capsule Take 72 mcg by mouth daily before breakfast.  . losartan (COZAAR) 50 MG tablet Take 50 mg by mouth daily.  . metFORMIN (GLUCOPHAGE) 500 MG tablet Take 500 mg by mouth 2 (two) times daily after a meal.  . metoprolol  (LOPRESSOR) 100 MG tablet Take 50 mg by mouth 2 (two) times daily.  . Multiple Vitamin (MULTIVITAMIN) capsule Take 1 capsule by mouth daily.  . niacin 500 MG tablet Take 500 mg by mouth at bedtime.  . pantoprazole (PROTONIX) 40 MG tablet Take 40 mg by mouth daily.  . simvastatin (ZOCOR) 10 MG tablet Take 10 mg by mouth daily.  Marland Kitchen torsemide (DEMADEX) 20 MG tablet Take 10 mg by mouth daily.  Marland Kitchen warfarin (COUMADIN) 7.5 MG tablet Take 7.5 mg by mouth daily.   No facility-administered encounter medications on file as of 12/31/2016.    ALLERGIES: Allergies  Allergen Reactions  . Lasix [Furosemide]    VACCINATION STATUS:  There is no immunization history on file for this patient.  Diabetes  She presents for her follow-up diabetic visit. She has type 2 diabetes mellitus. Onset time: She was diagnosed at approximate age of 69 years. Her disease course has been worsening. There are no hypoglycemic associated symptoms. Pertinent negatives for hypoglycemia include no confusion, headaches, pallor or seizures. Associated symptoms include fatigue, foot paresthesias and visual change. Pertinent negatives for diabetes include no chest pain, no polydipsia, no polyphagia and no polyuria. There are no hypoglycemic complications. Symptoms are worsening. Diabetic complications include peripheral neuropathy and retinopathy. Risk  factors for coronary artery disease include diabetes mellitus, dyslipidemia, hypertension, obesity and sedentary lifestyle. Current diabetic treatment includes insulin injections and oral agent (dual therapy). Her weight is increasing steadily. She is following a generally unhealthy diet. When asked about meal planning, she reported none. Prior visit with dietitian: She will have a visit with a dietitian today. Her breakfast blood glucose range is generally 140-180 mg/dl. Her lunch blood glucose range is generally 180-200 mg/dl. Her dinner blood glucose range is generally 180-200 mg/dl. Her  overall blood glucose range is 180-200 mg/dl. An ACE inhibitor/angiotensin II receptor blocker is being taken. Eye exam is current.  Hyperlipidemia  This is a chronic problem. The current episode started more than 1 year ago. Pertinent negatives include no chest pain, myalgias or shortness of breath. Current antihyperlipidemic treatment includes statins. Risk factors for coronary artery disease include dyslipidemia, diabetes mellitus, hypertension, obesity and a sedentary lifestyle.  Hypertension  This is a chronic problem. The current episode started more than 1 year ago. Pertinent negatives include no chest pain, headaches, palpitations or shortness of breath. Risk factors for coronary artery disease include diabetes mellitus, dyslipidemia and sedentary lifestyle. Past treatments include angiotensin blockers. Hypertensive end-organ damage includes retinopathy.      Review of Systems  Constitutional: Positive for fatigue. Negative for chills, fever and unexpected weight change.  HENT: Negative for trouble swallowing and voice change.   Eyes: Negative for visual disturbance.  Respiratory: Negative for cough, shortness of breath and wheezing.   Cardiovascular: Negative for chest pain, palpitations and leg swelling.  Gastrointestinal: Negative for diarrhea, nausea and vomiting.  Endocrine: Negative for cold intolerance, heat intolerance, polydipsia, polyphagia and polyuria.  Musculoskeletal: Negative for arthralgias and myalgias.  Skin: Negative for color change, pallor, rash and wound.  Neurological: Negative for seizures and headaches.  Psychiatric/Behavioral: Negative for confusion and suicidal ideas.    Objective:    BP (!) 143/73   Pulse 69   Ht '5\' 7"'$  (1.702 m)   Wt 180 lb (81.6 kg)   BMI 28.19 kg/m   Wt Readings from Last 3 Encounters:  12/31/16 180 lb (81.6 kg)  11/26/16 175 lb (79.4 kg)  10/23/16 172 lb (78 kg)    Physical Exam  Constitutional: She is oriented to person,  place, and time. She appears well-developed.  She walks with a walker.  HENT:  Head: Normocephalic and atraumatic.  Eyes: EOM are normal.  Neck: Normal range of motion. Neck supple. No tracheal deviation present. No thyromegaly present.  Cardiovascular: Normal rate and regular rhythm.   Pulses:      Dorsalis pedis pulses are 0 on the right side, and 0 on the left side.       Posterior tibial pulses are 0 on the right side, and 0 on the left side.  Pulmonary/Chest: Effort normal and breath sounds normal.  Abdominal: Soft. Bowel sounds are normal. There is no tenderness. There is no guarding.  Musculoskeletal: Normal range of motion. She exhibits no edema.       Feet:  Left leg ulcer status post 20 days of antibiotics. Following  up with her primary care doctor.  Neurological: She is alert and oriented to person, place, and time. She has normal reflexes. No cranial nerve deficit. Coordination normal.  Skin: Skin is warm and dry. No rash noted. No erythema. No pallor.  Psychiatric: She has a normal mood and affect. Judgment normal.    Recent Results (from the past 2160 hour(s))  Hemoglobin A1c  Status: Abnormal   Collection Time: 10/16/16 12:25 PM  Result Value Ref Range   Hgb A1c MFr Bld 10.1 (H) 4.8 - 5.6 %    Comment:          Pre-diabetes: 5.7 - 6.4          Diabetes: >6.4          Glycemic control for adults with diabetes: <7.0    Est. average glucose Bld gHb Est-mCnc 243 mg/dL  TSH     Status: Abnormal   Collection Time: 10/16/16 12:25 PM  Result Value Ref Range   TSH 0.058 (L) 0.450 - 4.500 uIU/mL  T4, free     Status: None   Collection Time: 10/16/16 12:25 PM  Result Value Ref Range   Free T4 1.38 0.82 - 1.77 ng/dL  CMP14+EGFR     Status: Abnormal   Collection Time: 10/16/16 12:25 PM  Result Value Ref Range   Glucose 182 (H) 65 - 99 mg/dL   BUN 12 8 - 27 mg/dL   Creatinine, Ser 0.64 0.57 - 1.00 mg/dL   GFR calc non Af Amer 86 >59 mL/min/1.73   GFR calc Af Amer 99  >59 mL/min/1.73   BUN/Creatinine Ratio 19 12 - 28   Sodium 143 134 - 144 mmol/L   Potassium 3.9 3.5 - 5.2 mmol/L   Chloride 102 96 - 106 mmol/L   CO2 28 18 - 29 mmol/L   Calcium 9.8 8.7 - 10.3 mg/dL   Total Protein 6.5 6.0 - 8.5 g/dL   Albumin 3.6 3.5 - 4.8 g/dL   Globulin, Total 2.9 1.5 - 4.5 g/dL   Albumin/Globulin Ratio 1.2 1.2 - 2.2   Bilirubin Total 0.5 0.0 - 1.2 mg/dL   Alkaline Phosphatase 84 39 - 117 IU/L   AST 18 0 - 40 IU/L   ALT 16 0 - 32 IU/L    Assessment & Plan:   1. DM type 2 causing vascular disease (HCC)  - Patient has currently uncontrolled symptomatic type 2 DM since  81 years of age. - She Recently came with trol of diabetes, with A1c increasing to 10.1% from 9.2%.  -Since her last visit ,  her blood glucose readings or improving.   Her diabetes is complicated by coronary artery disease, refractory disease, retinopathy and patient remains at a high risk for more acute and chronic complications of diabetes which include CAD, CVA, CKD, retinopathy, and neuropathy. These are all discussed in detail with the patient.  - I have counseled the patient on diet management and weight loss, by adopting a carbohydrate restricted/protein rich diet.  - Suggestion is made for patient to avoid simple carbohydrates   from their diet including Cakes , Desserts, Ice Cream,  Soda (  diet and regular) , Sweet Tea , Candies,  Chips, Cookies, Artificial Sweeteners,   and "Sugar-free" Products . This will help patient to have stable blood glucose profile and potentially avoid unintended weight gain.  - I encouraged the patient to switch to  unprocessed or minimally processed complex starch and increased protein intake (animal or plant source), fruits, and vegetables.  - Patient is advised to stick to a routine mealtimes to eat 3 meals  a day and avoid unnecessary snacks ( to snack only to correct hypoglycemia).  - The patient will be scheduled with Jearld Fenton, RDN, CDE for  individualized DM education.  - I have approached patient with the following individualized plan to manage diabetes and patient agrees:   -  She will continue to require basal/bolus insulin. - I approached her to continue strict monitoring of blood glucose 4 times a day-before meals and at bedtime and return in 2 weeks with her meter and logs - I  will lower Toujeo 50 units daily at bedtime, continue NovoLog 8 units 3 times a day before meals for female blood glucose between 90 and 150 mg/dL plus correction for pre-meal blood glucose above 115 mg/dL - Patient is warned not to take insulin without proper monitoring per orders.  -Patient is encouraged to call clinic for blood glucose levels less than 70 or above 300 mg /dl. - I will continue metformin 500 mg by mouth twice a day. - Patient specific target  A1c;  LDL, HDL, Triglycerides, and  Waist Circumference were discussed in detail.  2) BP/HTN: uncontrolled.  She has enough medications and I advised her to continue  current medications including ACEI/ARB. She is advised to limit salt consumption. 3) Lipids/HPL:  Controlled, LDL 48, continue statins. 4)  Weight/Diet: CDE Consult will be initiated , exercise, and detailed carbohydrates information provided.  5) hyperthyroidism: She has responded to methimazole. Her thyroid function tests are within normal limits now. She was taken off of methimazole.  She will need repeat thyroid function test in  6 months.  6) Chronic Care/Health Maintenance:  -Patient  on ACEI/ARB and Statin medications and encouraged to continue to follow up with Ophthalmology, Podiatrist at least yearly or according to recommendations, and advised to   stay away from smoking. I have recommended yearly flu vaccine and pneumonia vaccination at least every 5 years; moderate intensity exercise for up to 150 minutes weekly; and  sleep for at least 7 hours a day.  - 35 minutes of time was spent on the care of this patient , 50%  of which was applied for counseling on diabetes complications and their preventions.  - Patient to bring meter and  blood glucose logs during their next visit.   - I advised patient to maintain close follow up with [facility], MD for primary care needs.  Follow up plan: - Return in about 6 weeks (around 02/11/2017) for follow up with pre-visit labs, meter, and logs.  Glade Lloyd, MD Phone: [phone]  Fax: [phone]   12/31/2016, 10:57 AM

## 2016-12-31 NOTE — Patient Instructions (Signed)

## 2017-02-05 LAB — CMP14+EGFR
ALBUMIN: 3.7 g/dL (ref 3.5–4.8)
ALK PHOS: 80 IU/L (ref 39–117)
ALT: 17 IU/L (ref 0–32)
AST: 15 IU/L (ref 0–40)
Albumin/Globulin Ratio: 1.4 (ref 1.2–2.2)
BUN / CREAT RATIO: 29 — AB (ref 12–28)
BUN: 24 mg/dL (ref 8–27)
Bilirubin Total: 0.4 mg/dL (ref 0.0–1.2)
CO2: 26 mmol/L (ref 18–29)
CREATININE: 0.84 mg/dL (ref 0.57–1.00)
Calcium: 9.9 mg/dL (ref 8.7–10.3)
Chloride: 102 mmol/L (ref 96–106)
GFR calc non Af Amer: 67 mL/min/{1.73_m2} (ref >59)
GFR, EST AFRICAN AMERICAN: 77 mL/min/{1.73_m2} (ref >59)
GLOBULIN, TOTAL: 2.7 g/dL (ref 1.5–4.5)
Glucose: 177 mg/dL — ABNORMAL HIGH (ref 65–99)
Potassium: 4 mmol/L (ref 3.5–5.2)
SODIUM: 144 mmol/L (ref 134–144)
TOTAL PROTEIN: 6.4 g/dL (ref 6.0–8.5)

## 2017-02-07 ENCOUNTER — Telehealth: Payer: Self-pay | Admitting: "Endocrinology

## 2017-02-07 NOTE — Telephone Encounter (Signed)
p taking a [place] sample. I advised her that it would be the same instructions as the toujeo

## 2017-02-07 NOTE — Telephone Encounter (Signed)
Stephanie Orozco has questions about her insulin Toujeo. She ran out of her insulin Dr. Manuella Ghazi gave her a sample of something in its place and she has questions, please advise?

## 2017-02-11 ENCOUNTER — Ambulatory Visit: Payer: Medicare HMO | Admitting: "Endocrinology

## 2017-04-01 ENCOUNTER — Other Ambulatory Visit: Payer: Self-pay | Admitting: "Endocrinology

## 2017-06-23 ENCOUNTER — Encounter (HOSPITAL_BASED_OUTPATIENT_CLINIC_OR_DEPARTMENT_OTHER): Payer: Medicare HMO | Attending: Internal Medicine

## 2017-06-23 DIAGNOSIS — I252 Old myocardial infarction: Secondary | ICD-10-CM | POA: Insufficient documentation

## 2017-06-23 DIAGNOSIS — E11622 Type 2 diabetes mellitus with other skin ulcer: Secondary | ICD-10-CM | POA: Diagnosis present

## 2017-06-23 DIAGNOSIS — Z7901 Long term (current) use of anticoagulants: Secondary | ICD-10-CM | POA: Diagnosis not present

## 2017-06-23 DIAGNOSIS — L97819 Non-pressure chronic ulcer of other part of right lower leg with unspecified severity: Secondary | ICD-10-CM | POA: Diagnosis not present

## 2017-06-23 DIAGNOSIS — L97211 Non-pressure chronic ulcer of right calf limited to breakdown of skin: Secondary | ICD-10-CM | POA: Insufficient documentation

## 2017-06-23 DIAGNOSIS — L03115 Cellulitis of right lower limb: Secondary | ICD-10-CM | POA: Insufficient documentation

## 2017-06-23 DIAGNOSIS — E1140 Type 2 diabetes mellitus with diabetic neuropathy, unspecified: Secondary | ICD-10-CM | POA: Diagnosis not present

## 2017-06-23 DIAGNOSIS — E1151 Type 2 diabetes mellitus with diabetic peripheral angiopathy without gangrene: Secondary | ICD-10-CM | POA: Diagnosis not present

## 2017-06-23 DIAGNOSIS — I110 Hypertensive heart disease with heart failure: Secondary | ICD-10-CM | POA: Diagnosis not present

## 2017-06-23 DIAGNOSIS — Z794 Long term (current) use of insulin: Secondary | ICD-10-CM | POA: Diagnosis not present

## 2017-06-23 DIAGNOSIS — Z955 Presence of coronary angioplasty implant and graft: Secondary | ICD-10-CM | POA: Diagnosis not present

## 2017-06-23 DIAGNOSIS — L97812 Non-pressure chronic ulcer of other part of right lower leg with fat layer exposed: Secondary | ICD-10-CM | POA: Diagnosis not present

## 2017-06-23 DIAGNOSIS — I509 Heart failure, unspecified: Secondary | ICD-10-CM | POA: Diagnosis not present

## 2017-06-23 DIAGNOSIS — I87331 Chronic venous hypertension (idiopathic) with ulcer and inflammation of right lower extremity: Secondary | ICD-10-CM | POA: Diagnosis not present

## 2017-06-30 DIAGNOSIS — E11622 Type 2 diabetes mellitus with other skin ulcer: Secondary | ICD-10-CM | POA: Diagnosis not present

## 2017-07-03 DIAGNOSIS — E11622 Type 2 diabetes mellitus with other skin ulcer: Secondary | ICD-10-CM | POA: Diagnosis not present

## 2017-07-07 DIAGNOSIS — E11622 Type 2 diabetes mellitus with other skin ulcer: Secondary | ICD-10-CM | POA: Diagnosis not present

## 2017-07-14 DIAGNOSIS — E11622 Type 2 diabetes mellitus with other skin ulcer: Secondary | ICD-10-CM | POA: Diagnosis not present

## 2017-07-21 DIAGNOSIS — E11622 Type 2 diabetes mellitus with other skin ulcer: Secondary | ICD-10-CM | POA: Diagnosis not present

## 2017-10-27 ENCOUNTER — Encounter (INDEPENDENT_AMBULATORY_CARE_PROVIDER_SITE_OTHER): Payer: Medicare HMO | Admitting: Ophthalmology

## 2017-10-27 DIAGNOSIS — H35033 Hypertensive retinopathy, bilateral: Secondary | ICD-10-CM | POA: Diagnosis not present

## 2017-10-27 DIAGNOSIS — H33301 Unspecified retinal break, right eye: Secondary | ICD-10-CM

## 2017-10-27 DIAGNOSIS — E113392 Type 2 diabetes mellitus with moderate nonproliferative diabetic retinopathy without macular edema, left eye: Secondary | ICD-10-CM

## 2017-10-27 DIAGNOSIS — H43813 Vitreous degeneration, bilateral: Secondary | ICD-10-CM

## 2017-10-27 DIAGNOSIS — D3131 Benign neoplasm of right choroid: Secondary | ICD-10-CM

## 2017-10-27 DIAGNOSIS — E11319 Type 2 diabetes mellitus with unspecified diabetic retinopathy without macular edema: Secondary | ICD-10-CM

## 2017-10-27 DIAGNOSIS — H353122 Nonexudative age-related macular degeneration, left eye, intermediate dry stage: Secondary | ICD-10-CM | POA: Diagnosis not present

## 2017-10-27 DIAGNOSIS — E113311 Type 2 diabetes mellitus with moderate nonproliferative diabetic retinopathy with macular edema, right eye: Secondary | ICD-10-CM

## 2017-10-27 DIAGNOSIS — I10 Essential (primary) hypertension: Secondary | ICD-10-CM | POA: Diagnosis not present

## 2017-12-16 ENCOUNTER — Other Ambulatory Visit: Payer: Self-pay | Admitting: "Endocrinology

## 2018-01-24 ENCOUNTER — Other Ambulatory Visit: Payer: Self-pay | Admitting: "Endocrinology

## 2018-02-25 ENCOUNTER — Encounter (INDEPENDENT_AMBULATORY_CARE_PROVIDER_SITE_OTHER): Payer: Medicare HMO | Admitting: Ophthalmology

## 2018-02-25 DIAGNOSIS — H43813 Vitreous degeneration, bilateral: Secondary | ICD-10-CM | POA: Diagnosis not present

## 2018-02-25 DIAGNOSIS — E113393 Type 2 diabetes mellitus with moderate nonproliferative diabetic retinopathy without macular edema, bilateral: Secondary | ICD-10-CM | POA: Diagnosis not present

## 2018-02-25 DIAGNOSIS — I10 Essential (primary) hypertension: Secondary | ICD-10-CM | POA: Diagnosis not present

## 2018-02-25 DIAGNOSIS — E11319 Type 2 diabetes mellitus with unspecified diabetic retinopathy without macular edema: Secondary | ICD-10-CM | POA: Diagnosis not present

## 2018-02-25 DIAGNOSIS — H35033 Hypertensive retinopathy, bilateral: Secondary | ICD-10-CM | POA: Diagnosis not present

## 2018-02-25 DIAGNOSIS — H33301 Unspecified retinal break, right eye: Secondary | ICD-10-CM

## 2018-02-25 DIAGNOSIS — D3131 Benign neoplasm of right choroid: Secondary | ICD-10-CM

## 2018-02-26 DIAGNOSIS — I83003 Varicose veins of unspecified lower extremity with ulcer of ankle: Secondary | ICD-10-CM | POA: Diagnosis not present

## 2018-02-26 DIAGNOSIS — R601 Generalized edema: Secondary | ICD-10-CM | POA: Diagnosis not present

## 2018-02-27 DIAGNOSIS — I82409 Acute embolism and thrombosis of unspecified deep veins of unspecified lower extremity: Secondary | ICD-10-CM | POA: Diagnosis not present

## 2018-02-27 DIAGNOSIS — E7800 Pure hypercholesterolemia, unspecified: Secondary | ICD-10-CM | POA: Diagnosis not present

## 2018-02-27 DIAGNOSIS — Z6834 Body mass index (BMI) 34.0-34.9, adult: Secondary | ICD-10-CM | POA: Diagnosis not present

## 2018-02-27 DIAGNOSIS — I509 Heart failure, unspecified: Secondary | ICD-10-CM | POA: Diagnosis not present

## 2018-02-27 DIAGNOSIS — R21 Rash and other nonspecific skin eruption: Secondary | ICD-10-CM | POA: Diagnosis not present

## 2018-02-27 DIAGNOSIS — Z299 Encounter for prophylactic measures, unspecified: Secondary | ICD-10-CM | POA: Diagnosis not present

## 2018-02-27 DIAGNOSIS — I10 Essential (primary) hypertension: Secondary | ICD-10-CM | POA: Diagnosis not present

## 2018-03-02 DIAGNOSIS — Z299 Encounter for prophylactic measures, unspecified: Secondary | ICD-10-CM | POA: Diagnosis not present

## 2018-03-02 DIAGNOSIS — M1712 Unilateral primary osteoarthritis, left knee: Secondary | ICD-10-CM | POA: Diagnosis not present

## 2018-03-02 DIAGNOSIS — Z6834 Body mass index (BMI) 34.0-34.9, adult: Secondary | ICD-10-CM | POA: Diagnosis not present

## 2018-03-02 DIAGNOSIS — M1710 Unilateral primary osteoarthritis, unspecified knee: Secondary | ICD-10-CM | POA: Diagnosis not present

## 2018-03-02 DIAGNOSIS — E1165 Type 2 diabetes mellitus with hyperglycemia: Secondary | ICD-10-CM | POA: Diagnosis not present

## 2018-03-02 DIAGNOSIS — I82409 Acute embolism and thrombosis of unspecified deep veins of unspecified lower extremity: Secondary | ICD-10-CM | POA: Diagnosis not present

## 2018-03-02 DIAGNOSIS — I509 Heart failure, unspecified: Secondary | ICD-10-CM | POA: Diagnosis not present

## 2018-03-19 DIAGNOSIS — Z6833 Body mass index (BMI) 33.0-33.9, adult: Secondary | ICD-10-CM | POA: Diagnosis not present

## 2018-03-19 DIAGNOSIS — E1165 Type 2 diabetes mellitus with hyperglycemia: Secondary | ICD-10-CM | POA: Diagnosis not present

## 2018-03-19 DIAGNOSIS — I10 Essential (primary) hypertension: Secondary | ICD-10-CM | POA: Diagnosis not present

## 2018-03-19 DIAGNOSIS — I82409 Acute embolism and thrombosis of unspecified deep veins of unspecified lower extremity: Secondary | ICD-10-CM | POA: Diagnosis not present

## 2018-03-19 DIAGNOSIS — I509 Heart failure, unspecified: Secondary | ICD-10-CM | POA: Diagnosis not present

## 2018-03-19 DIAGNOSIS — Z299 Encounter for prophylactic measures, unspecified: Secondary | ICD-10-CM | POA: Diagnosis not present

## 2018-04-02 DIAGNOSIS — K432 Incisional hernia without obstruction or gangrene: Secondary | ICD-10-CM | POA: Diagnosis not present

## 2018-04-02 DIAGNOSIS — Z9889 Other specified postprocedural states: Secondary | ICD-10-CM | POA: Diagnosis not present

## 2018-04-03 DIAGNOSIS — Z299 Encounter for prophylactic measures, unspecified: Secondary | ICD-10-CM | POA: Diagnosis not present

## 2018-04-03 DIAGNOSIS — M1710 Unilateral primary osteoarthritis, unspecified knee: Secondary | ICD-10-CM | POA: Diagnosis not present

## 2018-04-03 DIAGNOSIS — I10 Essential (primary) hypertension: Secondary | ICD-10-CM | POA: Diagnosis not present

## 2018-04-03 DIAGNOSIS — I509 Heart failure, unspecified: Secondary | ICD-10-CM | POA: Diagnosis not present

## 2018-04-03 DIAGNOSIS — Z6833 Body mass index (BMI) 33.0-33.9, adult: Secondary | ICD-10-CM | POA: Diagnosis not present

## 2018-04-03 DIAGNOSIS — E1165 Type 2 diabetes mellitus with hyperglycemia: Secondary | ICD-10-CM | POA: Diagnosis not present

## 2018-04-09 DIAGNOSIS — L7634 Postprocedural seroma of skin and subcutaneous tissue following other procedure: Secondary | ICD-10-CM | POA: Diagnosis not present

## 2018-04-09 DIAGNOSIS — K458 Other specified abdominal hernia without obstruction or gangrene: Secondary | ICD-10-CM | POA: Diagnosis not present

## 2018-04-09 DIAGNOSIS — K429 Umbilical hernia without obstruction or gangrene: Secondary | ICD-10-CM | POA: Diagnosis not present

## 2018-04-09 DIAGNOSIS — Z9889 Other specified postprocedural states: Secondary | ICD-10-CM | POA: Diagnosis not present

## 2018-04-28 DIAGNOSIS — R601 Generalized edema: Secondary | ICD-10-CM | POA: Diagnosis not present

## 2018-04-28 DIAGNOSIS — I83003 Varicose veins of unspecified lower extremity with ulcer of ankle: Secondary | ICD-10-CM | POA: Diagnosis not present

## 2018-05-07 DIAGNOSIS — I83003 Varicose veins of unspecified lower extremity with ulcer of ankle: Secondary | ICD-10-CM | POA: Diagnosis not present

## 2018-05-08 DIAGNOSIS — I739 Peripheral vascular disease, unspecified: Secondary | ICD-10-CM | POA: Diagnosis not present

## 2018-05-08 DIAGNOSIS — E1165 Type 2 diabetes mellitus with hyperglycemia: Secondary | ICD-10-CM | POA: Diagnosis not present

## 2018-05-08 DIAGNOSIS — I80209 Phlebitis and thrombophlebitis of unspecified deep vessels of unspecified lower extremity: Secondary | ICD-10-CM | POA: Diagnosis not present

## 2018-05-08 DIAGNOSIS — I509 Heart failure, unspecified: Secondary | ICD-10-CM | POA: Diagnosis not present

## 2018-05-08 DIAGNOSIS — Z299 Encounter for prophylactic measures, unspecified: Secondary | ICD-10-CM | POA: Diagnosis not present

## 2018-05-08 DIAGNOSIS — Z6833 Body mass index (BMI) 33.0-33.9, adult: Secondary | ICD-10-CM | POA: Diagnosis not present

## 2018-05-08 DIAGNOSIS — I10 Essential (primary) hypertension: Secondary | ICD-10-CM | POA: Diagnosis not present

## 2018-05-21 DIAGNOSIS — Z993 Dependence on wheelchair: Secondary | ICD-10-CM | POA: Diagnosis not present

## 2018-05-21 DIAGNOSIS — L7634 Postprocedural seroma of skin and subcutaneous tissue following other procedure: Secondary | ICD-10-CM | POA: Diagnosis not present

## 2018-05-21 DIAGNOSIS — K432 Incisional hernia without obstruction or gangrene: Secondary | ICD-10-CM | POA: Diagnosis not present

## 2018-05-21 DIAGNOSIS — R0602 Shortness of breath: Secondary | ICD-10-CM | POA: Diagnosis not present

## 2018-06-05 DIAGNOSIS — I739 Peripheral vascular disease, unspecified: Secondary | ICD-10-CM | POA: Diagnosis not present

## 2018-06-05 DIAGNOSIS — E1142 Type 2 diabetes mellitus with diabetic polyneuropathy: Secondary | ICD-10-CM | POA: Diagnosis not present

## 2018-06-05 DIAGNOSIS — I80209 Phlebitis and thrombophlebitis of unspecified deep vessels of unspecified lower extremity: Secondary | ICD-10-CM | POA: Diagnosis not present

## 2018-06-05 DIAGNOSIS — Z6833 Body mass index (BMI) 33.0-33.9, adult: Secondary | ICD-10-CM | POA: Diagnosis not present

## 2018-06-05 DIAGNOSIS — I10 Essential (primary) hypertension: Secondary | ICD-10-CM | POA: Diagnosis not present

## 2018-06-05 DIAGNOSIS — E1165 Type 2 diabetes mellitus with hyperglycemia: Secondary | ICD-10-CM | POA: Diagnosis not present

## 2018-06-05 DIAGNOSIS — Z299 Encounter for prophylactic measures, unspecified: Secondary | ICD-10-CM | POA: Diagnosis not present

## 2018-06-09 DIAGNOSIS — M1712 Unilateral primary osteoarthritis, left knee: Secondary | ICD-10-CM | POA: Diagnosis not present

## 2018-06-09 DIAGNOSIS — I10 Essential (primary) hypertension: Secondary | ICD-10-CM | POA: Diagnosis not present

## 2018-06-09 DIAGNOSIS — E1165 Type 2 diabetes mellitus with hyperglycemia: Secondary | ICD-10-CM | POA: Diagnosis not present

## 2018-06-09 DIAGNOSIS — Z6833 Body mass index (BMI) 33.0-33.9, adult: Secondary | ICD-10-CM | POA: Diagnosis not present

## 2018-06-09 DIAGNOSIS — Z299 Encounter for prophylactic measures, unspecified: Secondary | ICD-10-CM | POA: Diagnosis not present

## 2018-06-12 DIAGNOSIS — E1165 Type 2 diabetes mellitus with hyperglycemia: Secondary | ICD-10-CM | POA: Diagnosis not present

## 2018-06-12 DIAGNOSIS — I10 Essential (primary) hypertension: Secondary | ICD-10-CM | POA: Diagnosis not present

## 2018-06-12 DIAGNOSIS — Z299 Encounter for prophylactic measures, unspecified: Secondary | ICD-10-CM | POA: Diagnosis not present

## 2018-06-12 DIAGNOSIS — I509 Heart failure, unspecified: Secondary | ICD-10-CM | POA: Diagnosis not present

## 2018-06-12 DIAGNOSIS — L309 Dermatitis, unspecified: Secondary | ICD-10-CM | POA: Diagnosis not present

## 2018-06-12 DIAGNOSIS — Z6833 Body mass index (BMI) 33.0-33.9, adult: Secondary | ICD-10-CM | POA: Diagnosis not present

## 2018-07-03 DIAGNOSIS — I80209 Phlebitis and thrombophlebitis of unspecified deep vessels of unspecified lower extremity: Secondary | ICD-10-CM | POA: Diagnosis not present

## 2018-07-03 DIAGNOSIS — Z299 Encounter for prophylactic measures, unspecified: Secondary | ICD-10-CM | POA: Diagnosis not present

## 2018-07-03 DIAGNOSIS — I739 Peripheral vascular disease, unspecified: Secondary | ICD-10-CM | POA: Diagnosis not present

## 2018-07-03 DIAGNOSIS — Z6833 Body mass index (BMI) 33.0-33.9, adult: Secondary | ICD-10-CM | POA: Diagnosis not present

## 2018-07-03 DIAGNOSIS — I509 Heart failure, unspecified: Secondary | ICD-10-CM | POA: Diagnosis not present

## 2018-07-03 DIAGNOSIS — Z23 Encounter for immunization: Secondary | ICD-10-CM | POA: Diagnosis not present

## 2018-07-03 DIAGNOSIS — E1142 Type 2 diabetes mellitus with diabetic polyneuropathy: Secondary | ICD-10-CM | POA: Diagnosis not present

## 2018-07-03 DIAGNOSIS — I10 Essential (primary) hypertension: Secondary | ICD-10-CM | POA: Diagnosis not present

## 2018-07-03 DIAGNOSIS — E1165 Type 2 diabetes mellitus with hyperglycemia: Secondary | ICD-10-CM | POA: Diagnosis not present

## 2018-07-07 DIAGNOSIS — M79676 Pain in unspecified toe(s): Secondary | ICD-10-CM | POA: Diagnosis not present

## 2018-07-07 DIAGNOSIS — B351 Tinea unguium: Secondary | ICD-10-CM | POA: Diagnosis not present

## 2018-07-07 DIAGNOSIS — L84 Corns and callosities: Secondary | ICD-10-CM | POA: Diagnosis not present

## 2018-07-07 DIAGNOSIS — E1142 Type 2 diabetes mellitus with diabetic polyneuropathy: Secondary | ICD-10-CM | POA: Diagnosis not present

## 2018-08-06 DIAGNOSIS — Z6833 Body mass index (BMI) 33.0-33.9, adult: Secondary | ICD-10-CM | POA: Diagnosis not present

## 2018-08-06 DIAGNOSIS — Z299 Encounter for prophylactic measures, unspecified: Secondary | ICD-10-CM | POA: Diagnosis not present

## 2018-08-06 DIAGNOSIS — E1165 Type 2 diabetes mellitus with hyperglycemia: Secondary | ICD-10-CM | POA: Diagnosis not present

## 2018-08-06 DIAGNOSIS — I509 Heart failure, unspecified: Secondary | ICD-10-CM | POA: Diagnosis not present

## 2018-08-06 DIAGNOSIS — I10 Essential (primary) hypertension: Secondary | ICD-10-CM | POA: Diagnosis not present

## 2018-08-06 DIAGNOSIS — Z86718 Personal history of other venous thrombosis and embolism: Secondary | ICD-10-CM | POA: Diagnosis not present

## 2018-08-14 DIAGNOSIS — S79911A Unspecified injury of right hip, initial encounter: Secondary | ICD-10-CM | POA: Diagnosis not present

## 2018-08-14 DIAGNOSIS — M25561 Pain in right knee: Secondary | ICD-10-CM | POA: Diagnosis not present

## 2018-08-14 DIAGNOSIS — J439 Emphysema, unspecified: Secondary | ICD-10-CM | POA: Diagnosis not present

## 2018-08-14 DIAGNOSIS — R42 Dizziness and giddiness: Secondary | ICD-10-CM | POA: Diagnosis not present

## 2018-08-14 DIAGNOSIS — M25551 Pain in right hip: Secondary | ICD-10-CM | POA: Diagnosis not present

## 2018-08-14 DIAGNOSIS — R0902 Hypoxemia: Secondary | ICD-10-CM | POA: Diagnosis not present

## 2018-08-14 DIAGNOSIS — W1839XA Other fall on same level, initial encounter: Secondary | ICD-10-CM | POA: Diagnosis not present

## 2018-08-14 DIAGNOSIS — M545 Low back pain: Secondary | ICD-10-CM | POA: Diagnosis not present

## 2018-08-14 DIAGNOSIS — I10 Essential (primary) hypertension: Secondary | ICD-10-CM | POA: Diagnosis not present

## 2018-08-14 DIAGNOSIS — S7001XA Contusion of right hip, initial encounter: Secondary | ICD-10-CM | POA: Diagnosis not present

## 2018-08-14 DIAGNOSIS — W19XXXA Unspecified fall, initial encounter: Secondary | ICD-10-CM | POA: Diagnosis not present

## 2018-08-14 DIAGNOSIS — R2681 Unsteadiness on feet: Secondary | ICD-10-CM | POA: Diagnosis not present

## 2018-08-14 DIAGNOSIS — G319 Degenerative disease of nervous system, unspecified: Secondary | ICD-10-CM | POA: Diagnosis not present

## 2018-08-14 DIAGNOSIS — R197 Diarrhea, unspecified: Secondary | ICD-10-CM | POA: Diagnosis not present

## 2018-08-14 DIAGNOSIS — I959 Hypotension, unspecified: Secondary | ICD-10-CM | POA: Diagnosis not present

## 2018-08-14 DIAGNOSIS — E1165 Type 2 diabetes mellitus with hyperglycemia: Secondary | ICD-10-CM | POA: Diagnosis not present

## 2018-08-14 DIAGNOSIS — M1711 Unilateral primary osteoarthritis, right knee: Secondary | ICD-10-CM | POA: Diagnosis not present

## 2018-08-14 DIAGNOSIS — R52 Pain, unspecified: Secondary | ICD-10-CM | POA: Diagnosis not present

## 2018-08-14 DIAGNOSIS — S299XXA Unspecified injury of thorax, initial encounter: Secondary | ICD-10-CM | POA: Diagnosis not present

## 2018-08-14 DIAGNOSIS — S3992XA Unspecified injury of lower back, initial encounter: Secondary | ICD-10-CM | POA: Diagnosis not present

## 2018-08-14 DIAGNOSIS — S0990XA Unspecified injury of head, initial encounter: Secondary | ICD-10-CM | POA: Diagnosis not present

## 2018-08-14 DIAGNOSIS — S8991XA Unspecified injury of right lower leg, initial encounter: Secondary | ICD-10-CM | POA: Diagnosis not present

## 2018-08-14 DIAGNOSIS — B9620 Unspecified Escherichia coli [E. coli] as the cause of diseases classified elsewhere: Secondary | ICD-10-CM | POA: Diagnosis not present

## 2018-08-14 DIAGNOSIS — B961 Klebsiella pneumoniae [K. pneumoniae] as the cause of diseases classified elsewhere: Secondary | ICD-10-CM | POA: Diagnosis not present

## 2018-08-14 DIAGNOSIS — N390 Urinary tract infection, site not specified: Secondary | ICD-10-CM | POA: Diagnosis not present

## 2018-08-15 DIAGNOSIS — R42 Dizziness and giddiness: Secondary | ICD-10-CM | POA: Diagnosis not present

## 2018-08-15 DIAGNOSIS — R197 Diarrhea, unspecified: Secondary | ICD-10-CM | POA: Diagnosis not present

## 2018-08-15 DIAGNOSIS — E1165 Type 2 diabetes mellitus with hyperglycemia: Secondary | ICD-10-CM | POA: Diagnosis not present

## 2018-08-15 DIAGNOSIS — R2681 Unsteadiness on feet: Secondary | ICD-10-CM | POA: Diagnosis not present

## 2018-08-16 DIAGNOSIS — R2681 Unsteadiness on feet: Secondary | ICD-10-CM | POA: Diagnosis not present

## 2018-08-16 DIAGNOSIS — E1165 Type 2 diabetes mellitus with hyperglycemia: Secondary | ICD-10-CM | POA: Diagnosis not present

## 2018-08-16 DIAGNOSIS — R42 Dizziness and giddiness: Secondary | ICD-10-CM | POA: Diagnosis not present

## 2018-08-16 DIAGNOSIS — R197 Diarrhea, unspecified: Secondary | ICD-10-CM | POA: Diagnosis not present

## 2018-08-17 DIAGNOSIS — E1165 Type 2 diabetes mellitus with hyperglycemia: Secondary | ICD-10-CM | POA: Diagnosis not present

## 2018-08-17 DIAGNOSIS — R197 Diarrhea, unspecified: Secondary | ICD-10-CM | POA: Diagnosis not present

## 2018-08-17 DIAGNOSIS — R42 Dizziness and giddiness: Secondary | ICD-10-CM | POA: Diagnosis not present

## 2018-08-17 DIAGNOSIS — R2681 Unsteadiness on feet: Secondary | ICD-10-CM | POA: Diagnosis not present

## 2018-08-18 DIAGNOSIS — R296 Repeated falls: Secondary | ICD-10-CM | POA: Diagnosis not present

## 2018-08-18 DIAGNOSIS — I2510 Atherosclerotic heart disease of native coronary artery without angina pectoris: Secondary | ICD-10-CM | POA: Diagnosis not present

## 2018-08-18 DIAGNOSIS — E119 Type 2 diabetes mellitus without complications: Secondary | ICD-10-CM | POA: Diagnosis not present

## 2018-08-18 DIAGNOSIS — I10 Essential (primary) hypertension: Secondary | ICD-10-CM | POA: Diagnosis not present

## 2018-08-18 DIAGNOSIS — R531 Weakness: Secondary | ICD-10-CM | POA: Diagnosis not present

## 2018-08-19 DIAGNOSIS — Z713 Dietary counseling and surveillance: Secondary | ICD-10-CM | POA: Diagnosis not present

## 2018-08-19 DIAGNOSIS — E1142 Type 2 diabetes mellitus with diabetic polyneuropathy: Secondary | ICD-10-CM | POA: Diagnosis not present

## 2018-08-19 DIAGNOSIS — R531 Weakness: Secondary | ICD-10-CM | POA: Diagnosis not present

## 2018-08-19 DIAGNOSIS — E119 Type 2 diabetes mellitus without complications: Secondary | ICD-10-CM | POA: Diagnosis not present

## 2018-08-19 DIAGNOSIS — N390 Urinary tract infection, site not specified: Secondary | ICD-10-CM | POA: Diagnosis not present

## 2018-08-19 DIAGNOSIS — R296 Repeated falls: Secondary | ICD-10-CM | POA: Diagnosis not present

## 2018-08-19 DIAGNOSIS — I10 Essential (primary) hypertension: Secondary | ICD-10-CM | POA: Diagnosis not present

## 2018-08-19 DIAGNOSIS — I2510 Atherosclerotic heart disease of native coronary artery without angina pectoris: Secondary | ICD-10-CM | POA: Diagnosis not present

## 2018-08-19 DIAGNOSIS — E1165 Type 2 diabetes mellitus with hyperglycemia: Secondary | ICD-10-CM | POA: Diagnosis not present

## 2018-08-19 DIAGNOSIS — Z299 Encounter for prophylactic measures, unspecified: Secondary | ICD-10-CM | POA: Diagnosis not present

## 2018-08-25 DIAGNOSIS — I2510 Atherosclerotic heart disease of native coronary artery without angina pectoris: Secondary | ICD-10-CM | POA: Diagnosis not present

## 2018-08-25 DIAGNOSIS — R296 Repeated falls: Secondary | ICD-10-CM | POA: Diagnosis not present

## 2018-08-25 DIAGNOSIS — I10 Essential (primary) hypertension: Secondary | ICD-10-CM | POA: Diagnosis not present

## 2018-08-25 DIAGNOSIS — R531 Weakness: Secondary | ICD-10-CM | POA: Diagnosis not present

## 2018-08-25 DIAGNOSIS — E119 Type 2 diabetes mellitus without complications: Secondary | ICD-10-CM | POA: Diagnosis not present

## 2018-08-26 DIAGNOSIS — N390 Urinary tract infection, site not specified: Secondary | ICD-10-CM | POA: Diagnosis not present

## 2018-08-26 DIAGNOSIS — E876 Hypokalemia: Secondary | ICD-10-CM | POA: Diagnosis not present

## 2018-08-26 DIAGNOSIS — E1165 Type 2 diabetes mellitus with hyperglycemia: Secondary | ICD-10-CM | POA: Diagnosis not present

## 2018-08-26 DIAGNOSIS — Z299 Encounter for prophylactic measures, unspecified: Secondary | ICD-10-CM | POA: Diagnosis not present

## 2018-08-26 DIAGNOSIS — I10 Essential (primary) hypertension: Secondary | ICD-10-CM | POA: Diagnosis not present

## 2018-08-26 DIAGNOSIS — R531 Weakness: Secondary | ICD-10-CM | POA: Diagnosis not present

## 2018-08-26 DIAGNOSIS — Z6833 Body mass index (BMI) 33.0-33.9, adult: Secondary | ICD-10-CM | POA: Diagnosis not present

## 2018-08-27 DIAGNOSIS — I2510 Atherosclerotic heart disease of native coronary artery without angina pectoris: Secondary | ICD-10-CM | POA: Diagnosis not present

## 2018-08-27 DIAGNOSIS — R296 Repeated falls: Secondary | ICD-10-CM | POA: Diagnosis not present

## 2018-08-27 DIAGNOSIS — R531 Weakness: Secondary | ICD-10-CM | POA: Diagnosis not present

## 2018-08-27 DIAGNOSIS — I10 Essential (primary) hypertension: Secondary | ICD-10-CM | POA: Diagnosis not present

## 2018-08-27 DIAGNOSIS — E119 Type 2 diabetes mellitus without complications: Secondary | ICD-10-CM | POA: Diagnosis not present

## 2018-08-30 DIAGNOSIS — E785 Hyperlipidemia, unspecified: Secondary | ICD-10-CM | POA: Diagnosis not present

## 2018-08-30 DIAGNOSIS — I10 Essential (primary) hypertension: Secondary | ICD-10-CM | POA: Diagnosis not present

## 2018-08-30 DIAGNOSIS — G4489 Other headache syndrome: Secondary | ICD-10-CM | POA: Diagnosis not present

## 2018-08-30 DIAGNOSIS — R42 Dizziness and giddiness: Secondary | ICD-10-CM | POA: Diagnosis not present

## 2018-08-30 DIAGNOSIS — E11649 Type 2 diabetes mellitus with hypoglycemia without coma: Secondary | ICD-10-CM | POA: Diagnosis not present

## 2018-08-31 DIAGNOSIS — I509 Heart failure, unspecified: Secondary | ICD-10-CM | POA: Diagnosis not present

## 2018-08-31 DIAGNOSIS — E1165 Type 2 diabetes mellitus with hyperglycemia: Secondary | ICD-10-CM | POA: Diagnosis not present

## 2018-08-31 DIAGNOSIS — Z86718 Personal history of other venous thrombosis and embolism: Secondary | ICD-10-CM | POA: Diagnosis not present

## 2018-08-31 DIAGNOSIS — I10 Essential (primary) hypertension: Secondary | ICD-10-CM | POA: Diagnosis not present

## 2018-08-31 DIAGNOSIS — Z299 Encounter for prophylactic measures, unspecified: Secondary | ICD-10-CM | POA: Diagnosis not present

## 2018-08-31 DIAGNOSIS — Z6833 Body mass index (BMI) 33.0-33.9, adult: Secondary | ICD-10-CM | POA: Diagnosis not present

## 2018-09-01 DIAGNOSIS — I10 Essential (primary) hypertension: Secondary | ICD-10-CM | POA: Diagnosis not present

## 2018-09-01 DIAGNOSIS — R531 Weakness: Secondary | ICD-10-CM | POA: Diagnosis not present

## 2018-09-01 DIAGNOSIS — E119 Type 2 diabetes mellitus without complications: Secondary | ICD-10-CM | POA: Diagnosis not present

## 2018-09-01 DIAGNOSIS — R296 Repeated falls: Secondary | ICD-10-CM | POA: Diagnosis not present

## 2018-09-01 DIAGNOSIS — I2510 Atherosclerotic heart disease of native coronary artery without angina pectoris: Secondary | ICD-10-CM | POA: Diagnosis not present

## 2018-09-02 ENCOUNTER — Encounter (INDEPENDENT_AMBULATORY_CARE_PROVIDER_SITE_OTHER): Payer: Medicare HMO | Admitting: Ophthalmology

## 2018-09-02 DIAGNOSIS — D3131 Benign neoplasm of right choroid: Secondary | ICD-10-CM | POA: Diagnosis not present

## 2018-09-02 DIAGNOSIS — H43813 Vitreous degeneration, bilateral: Secondary | ICD-10-CM

## 2018-09-02 DIAGNOSIS — H35033 Hypertensive retinopathy, bilateral: Secondary | ICD-10-CM | POA: Diagnosis not present

## 2018-09-02 DIAGNOSIS — E11319 Type 2 diabetes mellitus with unspecified diabetic retinopathy without macular edema: Secondary | ICD-10-CM | POA: Diagnosis not present

## 2018-09-02 DIAGNOSIS — H33301 Unspecified retinal break, right eye: Secondary | ICD-10-CM | POA: Diagnosis not present

## 2018-09-02 DIAGNOSIS — E113393 Type 2 diabetes mellitus with moderate nonproliferative diabetic retinopathy without macular edema, bilateral: Secondary | ICD-10-CM | POA: Diagnosis not present

## 2018-09-02 DIAGNOSIS — I10 Essential (primary) hypertension: Secondary | ICD-10-CM | POA: Diagnosis not present

## 2018-09-03 DIAGNOSIS — R296 Repeated falls: Secondary | ICD-10-CM | POA: Diagnosis not present

## 2018-09-03 DIAGNOSIS — E119 Type 2 diabetes mellitus without complications: Secondary | ICD-10-CM | POA: Diagnosis not present

## 2018-09-03 DIAGNOSIS — I2510 Atherosclerotic heart disease of native coronary artery without angina pectoris: Secondary | ICD-10-CM | POA: Diagnosis not present

## 2018-09-03 DIAGNOSIS — R531 Weakness: Secondary | ICD-10-CM | POA: Diagnosis not present

## 2018-09-03 DIAGNOSIS — I10 Essential (primary) hypertension: Secondary | ICD-10-CM | POA: Diagnosis not present

## 2018-09-08 DIAGNOSIS — R296 Repeated falls: Secondary | ICD-10-CM | POA: Diagnosis not present

## 2018-09-08 DIAGNOSIS — R531 Weakness: Secondary | ICD-10-CM | POA: Diagnosis not present

## 2018-09-08 DIAGNOSIS — E119 Type 2 diabetes mellitus without complications: Secondary | ICD-10-CM | POA: Diagnosis not present

## 2018-09-08 DIAGNOSIS — I10 Essential (primary) hypertension: Secondary | ICD-10-CM | POA: Diagnosis not present

## 2018-09-08 DIAGNOSIS — I2510 Atherosclerotic heart disease of native coronary artery without angina pectoris: Secondary | ICD-10-CM | POA: Diagnosis not present

## 2018-09-10 DIAGNOSIS — E119 Type 2 diabetes mellitus without complications: Secondary | ICD-10-CM | POA: Diagnosis not present

## 2018-09-10 DIAGNOSIS — I10 Essential (primary) hypertension: Secondary | ICD-10-CM | POA: Diagnosis not present

## 2018-09-10 DIAGNOSIS — R296 Repeated falls: Secondary | ICD-10-CM | POA: Diagnosis not present

## 2018-09-10 DIAGNOSIS — I2510 Atherosclerotic heart disease of native coronary artery without angina pectoris: Secondary | ICD-10-CM | POA: Diagnosis not present

## 2018-09-10 DIAGNOSIS — R531 Weakness: Secondary | ICD-10-CM | POA: Diagnosis not present

## 2018-09-17 DIAGNOSIS — R296 Repeated falls: Secondary | ICD-10-CM | POA: Diagnosis not present

## 2018-09-17 DIAGNOSIS — I2510 Atherosclerotic heart disease of native coronary artery without angina pectoris: Secondary | ICD-10-CM | POA: Diagnosis not present

## 2018-09-17 DIAGNOSIS — E119 Type 2 diabetes mellitus without complications: Secondary | ICD-10-CM | POA: Diagnosis not present

## 2018-09-17 DIAGNOSIS — I10 Essential (primary) hypertension: Secondary | ICD-10-CM | POA: Diagnosis not present

## 2018-09-17 DIAGNOSIS — R531 Weakness: Secondary | ICD-10-CM | POA: Diagnosis not present

## 2018-09-22 DIAGNOSIS — E1142 Type 2 diabetes mellitus with diabetic polyneuropathy: Secondary | ICD-10-CM | POA: Diagnosis not present

## 2018-09-22 DIAGNOSIS — L84 Corns and callosities: Secondary | ICD-10-CM | POA: Diagnosis not present

## 2018-09-22 DIAGNOSIS — B351 Tinea unguium: Secondary | ICD-10-CM | POA: Diagnosis not present

## 2018-09-22 DIAGNOSIS — M79676 Pain in unspecified toe(s): Secondary | ICD-10-CM | POA: Diagnosis not present

## 2018-09-24 DIAGNOSIS — I10 Essential (primary) hypertension: Secondary | ICD-10-CM | POA: Diagnosis not present

## 2018-09-24 DIAGNOSIS — E119 Type 2 diabetes mellitus without complications: Secondary | ICD-10-CM | POA: Diagnosis not present

## 2018-09-24 DIAGNOSIS — R531 Weakness: Secondary | ICD-10-CM | POA: Diagnosis not present

## 2018-09-24 DIAGNOSIS — R296 Repeated falls: Secondary | ICD-10-CM | POA: Diagnosis not present

## 2018-09-24 DIAGNOSIS — I2510 Atherosclerotic heart disease of native coronary artery without angina pectoris: Secondary | ICD-10-CM | POA: Diagnosis not present

## 2018-09-25 DIAGNOSIS — E119 Type 2 diabetes mellitus without complications: Secondary | ICD-10-CM | POA: Diagnosis not present

## 2018-09-25 DIAGNOSIS — R531 Weakness: Secondary | ICD-10-CM | POA: Diagnosis not present

## 2018-09-25 DIAGNOSIS — I10 Essential (primary) hypertension: Secondary | ICD-10-CM | POA: Diagnosis not present

## 2018-09-25 DIAGNOSIS — R296 Repeated falls: Secondary | ICD-10-CM | POA: Diagnosis not present

## 2018-09-25 DIAGNOSIS — I2510 Atherosclerotic heart disease of native coronary artery without angina pectoris: Secondary | ICD-10-CM | POA: Diagnosis not present

## 2018-09-28 DIAGNOSIS — E119 Type 2 diabetes mellitus without complications: Secondary | ICD-10-CM | POA: Diagnosis not present

## 2018-09-28 DIAGNOSIS — R531 Weakness: Secondary | ICD-10-CM | POA: Diagnosis not present

## 2018-09-28 DIAGNOSIS — I10 Essential (primary) hypertension: Secondary | ICD-10-CM | POA: Diagnosis not present

## 2018-09-28 DIAGNOSIS — R296 Repeated falls: Secondary | ICD-10-CM | POA: Diagnosis not present

## 2018-09-28 DIAGNOSIS — I2510 Atherosclerotic heart disease of native coronary artery without angina pectoris: Secondary | ICD-10-CM | POA: Diagnosis not present

## 2018-10-02 DIAGNOSIS — Z789 Other specified health status: Secondary | ICD-10-CM | POA: Diagnosis not present

## 2018-10-02 DIAGNOSIS — E119 Type 2 diabetes mellitus without complications: Secondary | ICD-10-CM | POA: Diagnosis not present

## 2018-10-02 DIAGNOSIS — I509 Heart failure, unspecified: Secondary | ICD-10-CM | POA: Diagnosis not present

## 2018-10-02 DIAGNOSIS — Z6833 Body mass index (BMI) 33.0-33.9, adult: Secondary | ICD-10-CM | POA: Diagnosis not present

## 2018-10-02 DIAGNOSIS — Z299 Encounter for prophylactic measures, unspecified: Secondary | ICD-10-CM | POA: Diagnosis not present

## 2018-10-02 DIAGNOSIS — R531 Weakness: Secondary | ICD-10-CM | POA: Diagnosis not present

## 2018-10-02 DIAGNOSIS — I80209 Phlebitis and thrombophlebitis of unspecified deep vessels of unspecified lower extremity: Secondary | ICD-10-CM | POA: Diagnosis not present

## 2018-10-02 DIAGNOSIS — R296 Repeated falls: Secondary | ICD-10-CM | POA: Diagnosis not present

## 2018-10-02 DIAGNOSIS — I82409 Acute embolism and thrombosis of unspecified deep veins of unspecified lower extremity: Secondary | ICD-10-CM | POA: Diagnosis not present

## 2018-10-02 DIAGNOSIS — I2510 Atherosclerotic heart disease of native coronary artery without angina pectoris: Secondary | ICD-10-CM | POA: Diagnosis not present

## 2018-10-02 DIAGNOSIS — I10 Essential (primary) hypertension: Secondary | ICD-10-CM | POA: Diagnosis not present

## 2018-10-06 DIAGNOSIS — E119 Type 2 diabetes mellitus without complications: Secondary | ICD-10-CM | POA: Diagnosis not present

## 2018-10-06 DIAGNOSIS — I10 Essential (primary) hypertension: Secondary | ICD-10-CM | POA: Diagnosis not present

## 2018-10-06 DIAGNOSIS — R531 Weakness: Secondary | ICD-10-CM | POA: Diagnosis not present

## 2018-10-06 DIAGNOSIS — R296 Repeated falls: Secondary | ICD-10-CM | POA: Diagnosis not present

## 2018-10-06 DIAGNOSIS — I2510 Atherosclerotic heart disease of native coronary artery without angina pectoris: Secondary | ICD-10-CM | POA: Diagnosis not present

## 2018-10-08 DIAGNOSIS — E119 Type 2 diabetes mellitus without complications: Secondary | ICD-10-CM | POA: Diagnosis not present

## 2018-10-08 DIAGNOSIS — I2510 Atherosclerotic heart disease of native coronary artery without angina pectoris: Secondary | ICD-10-CM | POA: Diagnosis not present

## 2018-10-08 DIAGNOSIS — R296 Repeated falls: Secondary | ICD-10-CM | POA: Diagnosis not present

## 2018-10-08 DIAGNOSIS — I10 Essential (primary) hypertension: Secondary | ICD-10-CM | POA: Diagnosis not present

## 2018-10-08 DIAGNOSIS — R531 Weakness: Secondary | ICD-10-CM | POA: Diagnosis not present

## 2018-10-12 DIAGNOSIS — I10 Essential (primary) hypertension: Secondary | ICD-10-CM | POA: Diagnosis not present

## 2018-10-12 DIAGNOSIS — I2510 Atherosclerotic heart disease of native coronary artery without angina pectoris: Secondary | ICD-10-CM | POA: Diagnosis not present

## 2018-10-12 DIAGNOSIS — E119 Type 2 diabetes mellitus without complications: Secondary | ICD-10-CM | POA: Diagnosis not present

## 2018-10-12 DIAGNOSIS — R296 Repeated falls: Secondary | ICD-10-CM | POA: Diagnosis not present

## 2018-10-12 DIAGNOSIS — R531 Weakness: Secondary | ICD-10-CM | POA: Diagnosis not present

## 2018-10-13 DIAGNOSIS — Z299 Encounter for prophylactic measures, unspecified: Secondary | ICD-10-CM | POA: Diagnosis not present

## 2018-10-13 DIAGNOSIS — M2560 Stiffness of unspecified joint, not elsewhere classified: Secondary | ICD-10-CM | POA: Diagnosis not present

## 2018-10-13 DIAGNOSIS — Z6833 Body mass index (BMI) 33.0-33.9, adult: Secondary | ICD-10-CM | POA: Diagnosis not present

## 2018-10-13 DIAGNOSIS — I10 Essential (primary) hypertension: Secondary | ICD-10-CM | POA: Diagnosis not present

## 2018-10-13 DIAGNOSIS — M7050 Other bursitis of knee, unspecified knee: Secondary | ICD-10-CM | POA: Diagnosis not present

## 2018-10-13 DIAGNOSIS — R262 Difficulty in walking, not elsewhere classified: Secondary | ICD-10-CM | POA: Diagnosis not present

## 2018-10-13 DIAGNOSIS — M170 Bilateral primary osteoarthritis of knee: Secondary | ICD-10-CM | POA: Diagnosis not present

## 2018-10-13 DIAGNOSIS — Z789 Other specified health status: Secondary | ICD-10-CM | POA: Diagnosis not present

## 2018-10-14 DIAGNOSIS — R296 Repeated falls: Secondary | ICD-10-CM | POA: Diagnosis not present

## 2018-10-14 DIAGNOSIS — E119 Type 2 diabetes mellitus without complications: Secondary | ICD-10-CM | POA: Diagnosis not present

## 2018-10-14 DIAGNOSIS — I10 Essential (primary) hypertension: Secondary | ICD-10-CM | POA: Diagnosis not present

## 2018-10-14 DIAGNOSIS — I2510 Atherosclerotic heart disease of native coronary artery without angina pectoris: Secondary | ICD-10-CM | POA: Diagnosis not present

## 2018-10-14 DIAGNOSIS — R531 Weakness: Secondary | ICD-10-CM | POA: Diagnosis not present

## 2018-10-23 DIAGNOSIS — Z6833 Body mass index (BMI) 33.0-33.9, adult: Secondary | ICD-10-CM | POA: Diagnosis not present

## 2018-10-23 DIAGNOSIS — M25561 Pain in right knee: Secondary | ICD-10-CM | POA: Diagnosis not present

## 2018-10-23 DIAGNOSIS — M2560 Stiffness of unspecified joint, not elsewhere classified: Secondary | ICD-10-CM | POA: Diagnosis not present

## 2018-10-23 DIAGNOSIS — M170 Bilateral primary osteoarthritis of knee: Secondary | ICD-10-CM | POA: Diagnosis not present

## 2018-10-23 DIAGNOSIS — Z299 Encounter for prophylactic measures, unspecified: Secondary | ICD-10-CM | POA: Diagnosis not present

## 2018-10-23 DIAGNOSIS — M25562 Pain in left knee: Secondary | ICD-10-CM | POA: Diagnosis not present

## 2018-10-28 DIAGNOSIS — I252 Old myocardial infarction: Secondary | ICD-10-CM | POA: Diagnosis not present

## 2018-10-28 DIAGNOSIS — K56609 Unspecified intestinal obstruction, unspecified as to partial versus complete obstruction: Secondary | ICD-10-CM | POA: Diagnosis not present

## 2018-10-28 DIAGNOSIS — Z7901 Long term (current) use of anticoagulants: Secondary | ICD-10-CM | POA: Diagnosis not present

## 2018-10-28 DIAGNOSIS — E785 Hyperlipidemia, unspecified: Secondary | ICD-10-CM | POA: Diagnosis not present

## 2018-10-28 DIAGNOSIS — E119 Type 2 diabetes mellitus without complications: Secondary | ICD-10-CM | POA: Diagnosis not present

## 2018-10-28 DIAGNOSIS — Z86718 Personal history of other venous thrombosis and embolism: Secondary | ICD-10-CM | POA: Diagnosis not present

## 2018-10-28 DIAGNOSIS — I10 Essential (primary) hypertension: Secondary | ICD-10-CM | POA: Diagnosis not present

## 2018-10-28 DIAGNOSIS — Z794 Long term (current) use of insulin: Secondary | ICD-10-CM | POA: Diagnosis not present

## 2018-10-28 DIAGNOSIS — Z79899 Other long term (current) drug therapy: Secondary | ICD-10-CM | POA: Diagnosis not present

## 2018-10-29 DIAGNOSIS — H538 Other visual disturbances: Secondary | ICD-10-CM | POA: Diagnosis not present

## 2018-10-29 DIAGNOSIS — I10 Essential (primary) hypertension: Secondary | ICD-10-CM | POA: Diagnosis not present

## 2018-10-29 DIAGNOSIS — E119 Type 2 diabetes mellitus without complications: Secondary | ICD-10-CM | POA: Diagnosis not present

## 2018-10-29 DIAGNOSIS — R0989 Other specified symptoms and signs involving the circulatory and respiratory systems: Secondary | ICD-10-CM | POA: Diagnosis not present

## 2018-10-29 DIAGNOSIS — Z7901 Long term (current) use of anticoagulants: Secondary | ICD-10-CM | POA: Diagnosis not present

## 2018-10-29 DIAGNOSIS — R41841 Cognitive communication deficit: Secondary | ICD-10-CM | POA: Diagnosis not present

## 2018-10-29 DIAGNOSIS — R140 Abdominal distension (gaseous): Secondary | ICD-10-CM | POA: Diagnosis not present

## 2018-10-29 DIAGNOSIS — I252 Old myocardial infarction: Secondary | ICD-10-CM | POA: Diagnosis not present

## 2018-10-29 DIAGNOSIS — R918 Other nonspecific abnormal finding of lung field: Secondary | ICD-10-CM | POA: Diagnosis not present

## 2018-10-29 DIAGNOSIS — R112 Nausea with vomiting, unspecified: Secondary | ICD-10-CM | POA: Diagnosis not present

## 2018-10-29 DIAGNOSIS — K432 Incisional hernia without obstruction or gangrene: Secondary | ICD-10-CM | POA: Diagnosis not present

## 2018-10-29 DIAGNOSIS — K56609 Unspecified intestinal obstruction, unspecified as to partial versus complete obstruction: Secondary | ICD-10-CM | POA: Diagnosis not present

## 2018-10-29 DIAGNOSIS — G8918 Other acute postprocedural pain: Secondary | ICD-10-CM | POA: Diagnosis not present

## 2018-10-29 DIAGNOSIS — K5900 Constipation, unspecified: Secondary | ICD-10-CM | POA: Diagnosis not present

## 2018-10-29 DIAGNOSIS — R000 Tachycardia, unspecified: Secondary | ICD-10-CM | POA: Diagnosis not present

## 2018-10-29 DIAGNOSIS — I110 Hypertensive heart disease with heart failure: Secondary | ICD-10-CM | POA: Diagnosis not present

## 2018-10-29 DIAGNOSIS — R9431 Abnormal electrocardiogram [ECG] [EKG]: Secondary | ICD-10-CM | POA: Diagnosis not present

## 2018-10-29 DIAGNOSIS — Z79899 Other long term (current) drug therapy: Secondary | ICD-10-CM | POA: Diagnosis not present

## 2018-10-29 DIAGNOSIS — R110 Nausea: Secondary | ICD-10-CM | POA: Diagnosis not present

## 2018-10-29 DIAGNOSIS — H547 Unspecified visual loss: Secondary | ICD-10-CM | POA: Diagnosis not present

## 2018-10-29 DIAGNOSIS — K430 Incisional hernia with obstruction, without gangrene: Secondary | ICD-10-CM | POA: Diagnosis not present

## 2018-10-29 DIAGNOSIS — M4185 Other forms of scoliosis, thoracolumbar region: Secondary | ICD-10-CM | POA: Diagnosis not present

## 2018-10-29 DIAGNOSIS — E1140 Type 2 diabetes mellitus with diabetic neuropathy, unspecified: Secondary | ICD-10-CM | POA: Diagnosis not present

## 2018-10-29 DIAGNOSIS — S301XXA Contusion of abdominal wall, initial encounter: Secondary | ICD-10-CM | POA: Diagnosis not present

## 2018-10-29 DIAGNOSIS — K5669 Other partial intestinal obstruction: Secondary | ICD-10-CM | POA: Diagnosis not present

## 2018-10-29 DIAGNOSIS — I493 Ventricular premature depolarization: Secondary | ICD-10-CM | POA: Diagnosis not present

## 2018-10-29 DIAGNOSIS — Z794 Long term (current) use of insulin: Secondary | ICD-10-CM | POA: Diagnosis not present

## 2018-10-29 DIAGNOSIS — R1013 Epigastric pain: Secondary | ICD-10-CM | POA: Diagnosis not present

## 2018-10-29 DIAGNOSIS — Z86718 Personal history of other venous thrombosis and embolism: Secondary | ICD-10-CM | POA: Diagnosis not present

## 2018-10-29 DIAGNOSIS — M6281 Muscle weakness (generalized): Secondary | ICD-10-CM | POA: Diagnosis not present

## 2018-10-29 DIAGNOSIS — R2689 Other abnormalities of gait and mobility: Secondary | ICD-10-CM | POA: Diagnosis not present

## 2018-10-29 DIAGNOSIS — E785 Hyperlipidemia, unspecified: Secondary | ICD-10-CM | POA: Diagnosis not present

## 2018-10-29 DIAGNOSIS — J9811 Atelectasis: Secondary | ICD-10-CM | POA: Diagnosis not present

## 2018-10-29 DIAGNOSIS — Z48815 Encounter for surgical aftercare following surgery on the digestive system: Secondary | ICD-10-CM | POA: Diagnosis not present

## 2018-10-29 DIAGNOSIS — K56691 Other complete intestinal obstruction: Secondary | ICD-10-CM | POA: Diagnosis not present

## 2018-10-29 DIAGNOSIS — Z431 Encounter for attention to gastrostomy: Secondary | ICD-10-CM | POA: Diagnosis not present

## 2018-10-29 DIAGNOSIS — R319 Hematuria, unspecified: Secondary | ICD-10-CM | POA: Diagnosis not present

## 2018-10-29 DIAGNOSIS — I5032 Chronic diastolic (congestive) heart failure: Secondary | ICD-10-CM | POA: Diagnosis not present

## 2018-10-29 DIAGNOSIS — L7634 Postprocedural seroma of skin and subcutaneous tissue following other procedure: Secondary | ICD-10-CM | POA: Diagnosis not present

## 2018-10-29 DIAGNOSIS — I708 Atherosclerosis of other arteries: Secondary | ICD-10-CM | POA: Diagnosis not present

## 2018-10-29 DIAGNOSIS — I517 Cardiomegaly: Secondary | ICD-10-CM | POA: Diagnosis not present

## 2018-10-29 DIAGNOSIS — R1084 Generalized abdominal pain: Secondary | ICD-10-CM | POA: Diagnosis not present

## 2018-10-29 DIAGNOSIS — Z8719 Personal history of other diseases of the digestive system: Secondary | ICD-10-CM | POA: Diagnosis not present

## 2018-10-29 DIAGNOSIS — R791 Abnormal coagulation profile: Secondary | ICD-10-CM | POA: Diagnosis not present

## 2018-10-29 DIAGNOSIS — I2119 ST elevation (STEMI) myocardial infarction involving other coronary artery of inferior wall: Secondary | ICD-10-CM | POA: Diagnosis not present

## 2018-10-29 DIAGNOSIS — I4891 Unspecified atrial fibrillation: Secondary | ICD-10-CM | POA: Diagnosis not present

## 2018-10-29 DIAGNOSIS — I2510 Atherosclerotic heart disease of native coronary artery without angina pectoris: Secondary | ICD-10-CM | POA: Diagnosis not present

## 2018-11-13 DIAGNOSIS — Z8719 Personal history of other diseases of the digestive system: Secondary | ICD-10-CM | POA: Diagnosis not present

## 2018-11-13 DIAGNOSIS — R791 Abnormal coagulation profile: Secondary | ICD-10-CM | POA: Diagnosis not present

## 2018-11-13 DIAGNOSIS — Z48815 Encounter for surgical aftercare following surgery on the digestive system: Secondary | ICD-10-CM | POA: Diagnosis not present

## 2018-11-13 DIAGNOSIS — Z7901 Long term (current) use of anticoagulants: Secondary | ICD-10-CM | POA: Diagnosis not present

## 2018-11-13 DIAGNOSIS — I4891 Unspecified atrial fibrillation: Secondary | ICD-10-CM | POA: Diagnosis not present

## 2018-11-13 DIAGNOSIS — Z9889 Other specified postprocedural states: Secondary | ICD-10-CM | POA: Diagnosis not present

## 2018-11-13 DIAGNOSIS — E119 Type 2 diabetes mellitus without complications: Secondary | ICD-10-CM | POA: Diagnosis not present

## 2018-11-13 DIAGNOSIS — I10 Essential (primary) hypertension: Secondary | ICD-10-CM | POA: Diagnosis not present

## 2018-11-13 DIAGNOSIS — E1140 Type 2 diabetes mellitus with diabetic neuropathy, unspecified: Secondary | ICD-10-CM | POA: Diagnosis not present

## 2018-11-13 DIAGNOSIS — H547 Unspecified visual loss: Secondary | ICD-10-CM | POA: Diagnosis not present

## 2018-11-13 DIAGNOSIS — R41841 Cognitive communication deficit: Secondary | ICD-10-CM | POA: Diagnosis not present

## 2018-11-13 DIAGNOSIS — R1013 Epigastric pain: Secondary | ICD-10-CM | POA: Diagnosis not present

## 2018-11-13 DIAGNOSIS — I2510 Atherosclerotic heart disease of native coronary artery without angina pectoris: Secondary | ICD-10-CM | POA: Diagnosis not present

## 2018-11-13 DIAGNOSIS — M6281 Muscle weakness (generalized): Secondary | ICD-10-CM | POA: Diagnosis not present

## 2018-11-13 DIAGNOSIS — Z794 Long term (current) use of insulin: Secondary | ICD-10-CM | POA: Diagnosis not present

## 2018-11-13 DIAGNOSIS — R2689 Other abnormalities of gait and mobility: Secondary | ICD-10-CM | POA: Diagnosis not present

## 2018-11-13 DIAGNOSIS — R110 Nausea: Secondary | ICD-10-CM | POA: Diagnosis not present

## 2018-11-13 DIAGNOSIS — Z5181 Encounter for therapeutic drug level monitoring: Secondary | ICD-10-CM | POA: Diagnosis not present

## 2018-11-18 ENCOUNTER — Other Ambulatory Visit: Payer: Self-pay | Admitting: *Deleted

## 2018-11-18 NOTE — Patient Outreach (Addendum)
[place] [facility]) Care Management  11/18/2018  Stephanie Orozco Feb 15, 1936 [id number]   Subjective: Telephone call to patient's home number, no answer, left HIPAA compliant voicemail message, and requested call back.     Objective: Per KPN (Knowledge Performance Now, point of care tool) and chart review, patient hospitalized 10/29/2018 -11/13/2018 for complete intestinal obstruction secondary to incarcerated ventral hernia, status post open incisional hernia repair with mesh and excision of her abdominal wall seroma.  on 10/30/2018 at [facility].   Patient also has a history of CAD, diabetes, hypertension, Atrial Fibrillation, and DVT.     Assessment: Received [employer] Transition of care follow up referral on 11/17/2018.  Transition of care follow up pending patient contact.      Plan: RNCM will send unsuccessful outreach letter, [facility] pamphlet, will call patient for 2nd telephone outreach attempt within 4 business days, transition of care follow up, and proceed with case closure, within 10 business days if no return call.       Teirra Carapia H. Annia Friendly, BSN, [place] Management [facility] Telephonic CM Phone: [phone] Fax: [phone]

## 2018-11-19 ENCOUNTER — Other Ambulatory Visit: Payer: Self-pay | Admitting: *Deleted

## 2018-11-19 NOTE — Patient Outreach (Signed)
[place] [facility]) Care Management  11/19/2018  Stephanie Orozco 08-Apr-1936 [id number]   Subjective: Received voicemail from Acie Fredrickson, states she needs additional rehab treatment at the facility, unable to go back home at this time, states she is returning call, and requested call back.  Telephone call to patient's home number, spoke with patient, and HIPAA verified.  Discussed [place] Transition of care follow up, patient voiced understanding, and is in agreement to follow up.   Patient states she is currently [facility].  States she is currently appealing rehab stay denial for  11/19/2018 discharge date.  States her 1st level appeal was denied, she has started the 2nd level appeal process, and is waiting for a call back regarding the decision.   Patient states she is planning to call today to  follow up on appeal status.   States she wants to go home, wants the care that she needs, but is not able to care for herself, needs rehab, and has to be able to take care of self at home.  States she currently lives with a 81 year old female friend who is also ill and has not provide any assistance with her activities of daily living.  Patient voices understanding of medical diagnosis, surgery, and treatment plan.  States she is very upset,  is planning to discuss her concerns regarding rehab denial with insurance company [facility]), and will exhaust appeal process if needed.  RNCM advised patient that [place] Management services do not impact insurance  authorization process and patient voices understanding.  Patient remains inpatient at rehab facility and no transition of care needs at this time.     Objective: Per KPN (Knowledge Performance Now, point of care tool) and chart review, patient hospitalized 10/29/2018 -11/13/2018 for complete intestinal obstruction secondary to incarcerated ventral hernia, status post open incisional hernia  repair with mesh and excision of her abdominal wall seroma.  on 10/30/2018 at [facility].   Patient also has a history of CAD, diabetes, hypertension, Atrial Fibrillation, and DVT.      Assessment: Received [employer] Transition of care follow up referral on 11/17/2018.  Transition of care follow up not completed and patient remains inpatient at rehab facility.      Plan: RNCM will close out referral due to patient remains inpatient.       Pharell Rolfson H. Annia Friendly, BSN, [employer] Management [facility] Telephonic CM Phone: [phone] Fax: [phone]

## 2018-11-22 DIAGNOSIS — E119 Type 2 diabetes mellitus without complications: Secondary | ICD-10-CM | POA: Diagnosis not present

## 2018-11-22 DIAGNOSIS — Z9889 Other specified postprocedural states: Secondary | ICD-10-CM | POA: Diagnosis not present

## 2018-11-26 DIAGNOSIS — M6281 Muscle weakness (generalized): Secondary | ICD-10-CM | POA: Diagnosis not present

## 2018-11-27 ENCOUNTER — Other Ambulatory Visit: Payer: Self-pay

## 2018-11-27 DIAGNOSIS — I10 Essential (primary) hypertension: Secondary | ICD-10-CM | POA: Diagnosis not present

## 2018-11-27 DIAGNOSIS — I4891 Unspecified atrial fibrillation: Secondary | ICD-10-CM | POA: Diagnosis not present

## 2018-11-27 DIAGNOSIS — Z48815 Encounter for surgical aftercare following surgery on the digestive system: Secondary | ICD-10-CM | POA: Diagnosis not present

## 2018-11-27 DIAGNOSIS — I2510 Atherosclerotic heart disease of native coronary artery without angina pectoris: Secondary | ICD-10-CM | POA: Diagnosis not present

## 2018-11-27 DIAGNOSIS — E1142 Type 2 diabetes mellitus with diabetic polyneuropathy: Secondary | ICD-10-CM | POA: Diagnosis not present

## 2018-11-27 DIAGNOSIS — I890 Lymphedema, not elsewhere classified: Secondary | ICD-10-CM | POA: Diagnosis not present

## 2018-11-27 NOTE — Patient Outreach (Signed)
[name]) Care Management  11/27/2018  [name] 1936-01-08 [id number]   EMMI- General Discharge RED ON EMMI ALERT Day # 1 Date: 11/27/2018 Red Alert Reason:  Got discharge papers? I Don't Know  Know who to call about changes in condition? No  Unfilled prescriptions? Yes  Wounds healing well? No  Other questions/problems? Yes    Outreach attempt: Spoke with patient.  She states that she is doing ok.  She states she left the facility on 11/25/2018.  She states she had a bowel obstruction that causes all her problems. Discussed reason for call and red alerts.  Patient states that she has her discharge papers but have not read them but states she will.  Discussed importance of going over discharge papers.  She verbalized understanding.  Patient states she has an abdominal wound that has to be changed.  Discussed with patient signs of infection and when to notify physician.  She states that the home health nurse is coming today at 11:00 am and she is getting ready for that.  She states that her wound is healing and that she is wearing her abdominal band as ordered by the physician.  Patient states she knows to either call her PCP or surgeon for questions or concerns.  Patient reports she lives with an elderly friend who really is unable to assist her.  She states she walks with a walker and does not ambulate too far as she has some weakness.  Patient has a follow up appointment with her PCP on Thursday and states she has no problems with transportation. Patient states she is taking her medication as prescribed but unable to review as she does not have her medication near by.  Patient declines any further questions, concerns, or needs at this time.    Plan: RN CM will follow up with patient within 1 week.  [name], RN, MSN [place] Management Care Management Coordinator Direct Line [phone] Cell [phone] Toll Free: [phone]  Fax: [phone]

## 2018-11-30 DIAGNOSIS — I4891 Unspecified atrial fibrillation: Secondary | ICD-10-CM | POA: Diagnosis not present

## 2018-11-30 DIAGNOSIS — I10 Essential (primary) hypertension: Secondary | ICD-10-CM | POA: Diagnosis not present

## 2018-11-30 DIAGNOSIS — Z48815 Encounter for surgical aftercare following surgery on the digestive system: Secondary | ICD-10-CM | POA: Diagnosis not present

## 2018-11-30 DIAGNOSIS — I890 Lymphedema, not elsewhere classified: Secondary | ICD-10-CM | POA: Diagnosis not present

## 2018-11-30 DIAGNOSIS — I2510 Atherosclerotic heart disease of native coronary artery without angina pectoris: Secondary | ICD-10-CM | POA: Diagnosis not present

## 2018-11-30 DIAGNOSIS — E1142 Type 2 diabetes mellitus with diabetic polyneuropathy: Secondary | ICD-10-CM | POA: Diagnosis not present

## 2018-12-01 ENCOUNTER — Other Ambulatory Visit: Payer: Self-pay

## 2018-12-01 DIAGNOSIS — I2510 Atherosclerotic heart disease of native coronary artery without angina pectoris: Secondary | ICD-10-CM | POA: Diagnosis not present

## 2018-12-01 DIAGNOSIS — I890 Lymphedema, not elsewhere classified: Secondary | ICD-10-CM | POA: Diagnosis not present

## 2018-12-01 DIAGNOSIS — Z48815 Encounter for surgical aftercare following surgery on the digestive system: Secondary | ICD-10-CM | POA: Diagnosis not present

## 2018-12-01 DIAGNOSIS — E1142 Type 2 diabetes mellitus with diabetic polyneuropathy: Secondary | ICD-10-CM | POA: Diagnosis not present

## 2018-12-01 DIAGNOSIS — I10 Essential (primary) hypertension: Secondary | ICD-10-CM | POA: Diagnosis not present

## 2018-12-01 DIAGNOSIS — I4891 Unspecified atrial fibrillation: Secondary | ICD-10-CM | POA: Diagnosis not present

## 2018-12-01 NOTE — Patient Outreach (Signed)
[place] [facility]) Care Management  12/01/2018  DELORIA BRASSFIELD 1936-01-29 [id number]   EMMI- General Discharge RED ON [place] Day # 1 Date:11/27/2018 Red Alert Reason: Got discharge papers? I Don't Know  Know who to call about changes in condition? No  Unfilled prescriptions? Yes  Wounds healing well? No  Other questions/problems? Yes    Follow- up outreach attempt:spoke with patient. She reports that she is doing better.  She states that home health has started and she has Nurse, PT, and OT.  She states that the nurse is coming again today.  Patient denies any problems with her wound and that the home health nurse is providing wound care.  Patient states that her follow up with her physician is tomorrow and not Thursday as she stated last week.  She states a friend will be taking her. She is taking her medications as prescribed and denies any questions, concerns, or needs presently.    Plan: RN CM will close case.  [name], RN, MSN [place] Management Care Management Coordinator Direct Line [phone] Cell [phone] Toll Free: [phone]  Fax: [phone]

## 2018-12-02 DIAGNOSIS — I82409 Acute embolism and thrombosis of unspecified deep veins of unspecified lower extremity: Secondary | ICD-10-CM | POA: Diagnosis not present

## 2018-12-02 DIAGNOSIS — K429 Umbilical hernia without obstruction or gangrene: Secondary | ICD-10-CM | POA: Diagnosis not present

## 2018-12-02 DIAGNOSIS — I10 Essential (primary) hypertension: Secondary | ICD-10-CM | POA: Diagnosis not present

## 2018-12-02 DIAGNOSIS — Z6833 Body mass index (BMI) 33.0-33.9, adult: Secondary | ICD-10-CM | POA: Diagnosis not present

## 2018-12-02 DIAGNOSIS — Z299 Encounter for prophylactic measures, unspecified: Secondary | ICD-10-CM | POA: Diagnosis not present

## 2018-12-02 DIAGNOSIS — E1142 Type 2 diabetes mellitus with diabetic polyneuropathy: Secondary | ICD-10-CM | POA: Diagnosis not present

## 2018-12-02 DIAGNOSIS — E1165 Type 2 diabetes mellitus with hyperglycemia: Secondary | ICD-10-CM | POA: Diagnosis not present

## 2018-12-03 DIAGNOSIS — E1142 Type 2 diabetes mellitus with diabetic polyneuropathy: Secondary | ICD-10-CM | POA: Diagnosis not present

## 2018-12-03 DIAGNOSIS — I890 Lymphedema, not elsewhere classified: Secondary | ICD-10-CM | POA: Diagnosis not present

## 2018-12-03 DIAGNOSIS — I4891 Unspecified atrial fibrillation: Secondary | ICD-10-CM | POA: Diagnosis not present

## 2018-12-03 DIAGNOSIS — I2510 Atherosclerotic heart disease of native coronary artery without angina pectoris: Secondary | ICD-10-CM | POA: Diagnosis not present

## 2018-12-03 DIAGNOSIS — I10 Essential (primary) hypertension: Secondary | ICD-10-CM | POA: Diagnosis not present

## 2018-12-03 DIAGNOSIS — Z48815 Encounter for surgical aftercare following surgery on the digestive system: Secondary | ICD-10-CM | POA: Diagnosis not present

## 2018-12-04 DIAGNOSIS — Z48815 Encounter for surgical aftercare following surgery on the digestive system: Secondary | ICD-10-CM | POA: Diagnosis not present

## 2018-12-04 DIAGNOSIS — I2510 Atherosclerotic heart disease of native coronary artery without angina pectoris: Secondary | ICD-10-CM | POA: Diagnosis not present

## 2018-12-04 DIAGNOSIS — I10 Essential (primary) hypertension: Secondary | ICD-10-CM | POA: Diagnosis not present

## 2018-12-04 DIAGNOSIS — E1142 Type 2 diabetes mellitus with diabetic polyneuropathy: Secondary | ICD-10-CM | POA: Diagnosis not present

## 2018-12-04 DIAGNOSIS — I4891 Unspecified atrial fibrillation: Secondary | ICD-10-CM | POA: Diagnosis not present

## 2018-12-04 DIAGNOSIS — I890 Lymphedema, not elsewhere classified: Secondary | ICD-10-CM | POA: Diagnosis not present

## 2018-12-07 DIAGNOSIS — E1142 Type 2 diabetes mellitus with diabetic polyneuropathy: Secondary | ICD-10-CM | POA: Diagnosis not present

## 2018-12-07 DIAGNOSIS — I2510 Atherosclerotic heart disease of native coronary artery without angina pectoris: Secondary | ICD-10-CM | POA: Diagnosis not present

## 2018-12-07 DIAGNOSIS — I10 Essential (primary) hypertension: Secondary | ICD-10-CM | POA: Diagnosis not present

## 2018-12-07 DIAGNOSIS — I890 Lymphedema, not elsewhere classified: Secondary | ICD-10-CM | POA: Diagnosis not present

## 2018-12-07 DIAGNOSIS — I4891 Unspecified atrial fibrillation: Secondary | ICD-10-CM | POA: Diagnosis not present

## 2018-12-07 DIAGNOSIS — Z48815 Encounter for surgical aftercare following surgery on the digestive system: Secondary | ICD-10-CM | POA: Diagnosis not present

## 2018-12-08 DIAGNOSIS — Z48815 Encounter for surgical aftercare following surgery on the digestive system: Secondary | ICD-10-CM | POA: Diagnosis not present

## 2018-12-08 DIAGNOSIS — I890 Lymphedema, not elsewhere classified: Secondary | ICD-10-CM | POA: Diagnosis not present

## 2018-12-08 DIAGNOSIS — I10 Essential (primary) hypertension: Secondary | ICD-10-CM | POA: Diagnosis not present

## 2018-12-08 DIAGNOSIS — E1142 Type 2 diabetes mellitus with diabetic polyneuropathy: Secondary | ICD-10-CM | POA: Diagnosis not present

## 2018-12-08 DIAGNOSIS — I4891 Unspecified atrial fibrillation: Secondary | ICD-10-CM | POA: Diagnosis not present

## 2018-12-08 DIAGNOSIS — I2510 Atherosclerotic heart disease of native coronary artery without angina pectoris: Secondary | ICD-10-CM | POA: Diagnosis not present

## 2018-12-10 DIAGNOSIS — I890 Lymphedema, not elsewhere classified: Secondary | ICD-10-CM | POA: Diagnosis not present

## 2018-12-10 DIAGNOSIS — E1142 Type 2 diabetes mellitus with diabetic polyneuropathy: Secondary | ICD-10-CM | POA: Diagnosis not present

## 2018-12-10 DIAGNOSIS — I10 Essential (primary) hypertension: Secondary | ICD-10-CM | POA: Diagnosis not present

## 2018-12-10 DIAGNOSIS — I2510 Atherosclerotic heart disease of native coronary artery without angina pectoris: Secondary | ICD-10-CM | POA: Diagnosis not present

## 2018-12-10 DIAGNOSIS — Z48815 Encounter for surgical aftercare following surgery on the digestive system: Secondary | ICD-10-CM | POA: Diagnosis not present

## 2018-12-10 DIAGNOSIS — I4891 Unspecified atrial fibrillation: Secondary | ICD-10-CM | POA: Diagnosis not present

## 2018-12-15 DIAGNOSIS — I890 Lymphedema, not elsewhere classified: Secondary | ICD-10-CM | POA: Diagnosis not present

## 2018-12-15 DIAGNOSIS — I4891 Unspecified atrial fibrillation: Secondary | ICD-10-CM | POA: Diagnosis not present

## 2018-12-15 DIAGNOSIS — Z48815 Encounter for surgical aftercare following surgery on the digestive system: Secondary | ICD-10-CM | POA: Diagnosis not present

## 2018-12-15 DIAGNOSIS — I10 Essential (primary) hypertension: Secondary | ICD-10-CM | POA: Diagnosis not present

## 2018-12-15 DIAGNOSIS — I2510 Atherosclerotic heart disease of native coronary artery without angina pectoris: Secondary | ICD-10-CM | POA: Diagnosis not present

## 2018-12-15 DIAGNOSIS — E1142 Type 2 diabetes mellitus with diabetic polyneuropathy: Secondary | ICD-10-CM | POA: Diagnosis not present

## 2018-12-21 DIAGNOSIS — I4891 Unspecified atrial fibrillation: Secondary | ICD-10-CM | POA: Diagnosis not present

## 2018-12-21 DIAGNOSIS — I890 Lymphedema, not elsewhere classified: Secondary | ICD-10-CM | POA: Diagnosis not present

## 2018-12-21 DIAGNOSIS — E1142 Type 2 diabetes mellitus with diabetic polyneuropathy: Secondary | ICD-10-CM | POA: Diagnosis not present

## 2018-12-21 DIAGNOSIS — I2510 Atherosclerotic heart disease of native coronary artery without angina pectoris: Secondary | ICD-10-CM | POA: Diagnosis not present

## 2018-12-21 DIAGNOSIS — I10 Essential (primary) hypertension: Secondary | ICD-10-CM | POA: Diagnosis not present

## 2018-12-21 DIAGNOSIS — Z48815 Encounter for surgical aftercare following surgery on the digestive system: Secondary | ICD-10-CM | POA: Diagnosis not present

## 2018-12-28 DIAGNOSIS — Z48815 Encounter for surgical aftercare following surgery on the digestive system: Secondary | ICD-10-CM | POA: Diagnosis not present

## 2018-12-28 DIAGNOSIS — I4891 Unspecified atrial fibrillation: Secondary | ICD-10-CM | POA: Diagnosis not present

## 2018-12-28 DIAGNOSIS — E1142 Type 2 diabetes mellitus with diabetic polyneuropathy: Secondary | ICD-10-CM | POA: Diagnosis not present

## 2018-12-28 DIAGNOSIS — I2510 Atherosclerotic heart disease of native coronary artery without angina pectoris: Secondary | ICD-10-CM | POA: Diagnosis not present

## 2018-12-28 DIAGNOSIS — I890 Lymphedema, not elsewhere classified: Secondary | ICD-10-CM | POA: Diagnosis not present

## 2018-12-28 DIAGNOSIS — I10 Essential (primary) hypertension: Secondary | ICD-10-CM | POA: Diagnosis not present

## 2019-01-04 DIAGNOSIS — E1142 Type 2 diabetes mellitus with diabetic polyneuropathy: Secondary | ICD-10-CM | POA: Diagnosis not present

## 2019-01-04 DIAGNOSIS — Z48815 Encounter for surgical aftercare following surgery on the digestive system: Secondary | ICD-10-CM | POA: Diagnosis not present

## 2019-01-04 DIAGNOSIS — I890 Lymphedema, not elsewhere classified: Secondary | ICD-10-CM | POA: Diagnosis not present

## 2019-01-04 DIAGNOSIS — I4891 Unspecified atrial fibrillation: Secondary | ICD-10-CM | POA: Diagnosis not present

## 2019-01-04 DIAGNOSIS — I10 Essential (primary) hypertension: Secondary | ICD-10-CM | POA: Diagnosis not present

## 2019-01-04 DIAGNOSIS — I2510 Atherosclerotic heart disease of native coronary artery without angina pectoris: Secondary | ICD-10-CM | POA: Diagnosis not present

## 2019-01-07 DIAGNOSIS — Z6833 Body mass index (BMI) 33.0-33.9, adult: Secondary | ICD-10-CM | POA: Diagnosis not present

## 2019-01-07 DIAGNOSIS — I10 Essential (primary) hypertension: Secondary | ICD-10-CM | POA: Diagnosis not present

## 2019-01-07 DIAGNOSIS — Z299 Encounter for prophylactic measures, unspecified: Secondary | ICD-10-CM | POA: Diagnosis not present

## 2019-01-07 DIAGNOSIS — I82409 Acute embolism and thrombosis of unspecified deep veins of unspecified lower extremity: Secondary | ICD-10-CM | POA: Diagnosis not present

## 2019-01-07 DIAGNOSIS — I509 Heart failure, unspecified: Secondary | ICD-10-CM | POA: Diagnosis not present

## 2019-01-11 DIAGNOSIS — I10 Essential (primary) hypertension: Secondary | ICD-10-CM | POA: Diagnosis not present

## 2019-01-11 DIAGNOSIS — I2510 Atherosclerotic heart disease of native coronary artery without angina pectoris: Secondary | ICD-10-CM | POA: Diagnosis not present

## 2019-01-11 DIAGNOSIS — E1142 Type 2 diabetes mellitus with diabetic polyneuropathy: Secondary | ICD-10-CM | POA: Diagnosis not present

## 2019-01-11 DIAGNOSIS — I890 Lymphedema, not elsewhere classified: Secondary | ICD-10-CM | POA: Diagnosis not present

## 2019-01-11 DIAGNOSIS — I4891 Unspecified atrial fibrillation: Secondary | ICD-10-CM | POA: Diagnosis not present

## 2019-01-11 DIAGNOSIS — Z48815 Encounter for surgical aftercare following surgery on the digestive system: Secondary | ICD-10-CM | POA: Diagnosis not present

## 2019-01-18 DIAGNOSIS — Z48815 Encounter for surgical aftercare following surgery on the digestive system: Secondary | ICD-10-CM | POA: Diagnosis not present

## 2019-01-18 DIAGNOSIS — I2510 Atherosclerotic heart disease of native coronary artery without angina pectoris: Secondary | ICD-10-CM | POA: Diagnosis not present

## 2019-01-18 DIAGNOSIS — E1142 Type 2 diabetes mellitus with diabetic polyneuropathy: Secondary | ICD-10-CM | POA: Diagnosis not present

## 2019-01-18 DIAGNOSIS — I10 Essential (primary) hypertension: Secondary | ICD-10-CM | POA: Diagnosis not present

## 2019-01-18 DIAGNOSIS — I4891 Unspecified atrial fibrillation: Secondary | ICD-10-CM | POA: Diagnosis not present

## 2019-01-18 DIAGNOSIS — I890 Lymphedema, not elsewhere classified: Secondary | ICD-10-CM | POA: Diagnosis not present

## 2019-01-25 DIAGNOSIS — I10 Essential (primary) hypertension: Secondary | ICD-10-CM | POA: Diagnosis not present

## 2019-01-25 DIAGNOSIS — I4891 Unspecified atrial fibrillation: Secondary | ICD-10-CM | POA: Diagnosis not present

## 2019-01-25 DIAGNOSIS — Z48815 Encounter for surgical aftercare following surgery on the digestive system: Secondary | ICD-10-CM | POA: Diagnosis not present

## 2019-01-25 DIAGNOSIS — E1142 Type 2 diabetes mellitus with diabetic polyneuropathy: Secondary | ICD-10-CM | POA: Diagnosis not present

## 2019-01-25 DIAGNOSIS — I890 Lymphedema, not elsewhere classified: Secondary | ICD-10-CM | POA: Diagnosis not present

## 2019-01-25 DIAGNOSIS — I2510 Atherosclerotic heart disease of native coronary artery without angina pectoris: Secondary | ICD-10-CM | POA: Diagnosis not present

## 2019-02-05 DIAGNOSIS — Z6833 Body mass index (BMI) 33.0-33.9, adult: Secondary | ICD-10-CM | POA: Diagnosis not present

## 2019-02-05 DIAGNOSIS — I82409 Acute embolism and thrombosis of unspecified deep veins of unspecified lower extremity: Secondary | ICD-10-CM | POA: Diagnosis not present

## 2019-02-05 DIAGNOSIS — M1710 Unilateral primary osteoarthritis, unspecified knee: Secondary | ICD-10-CM | POA: Diagnosis not present

## 2019-02-05 DIAGNOSIS — M170 Bilateral primary osteoarthritis of knee: Secondary | ICD-10-CM | POA: Diagnosis not present

## 2019-02-05 DIAGNOSIS — E1165 Type 2 diabetes mellitus with hyperglycemia: Secondary | ICD-10-CM | POA: Diagnosis not present

## 2019-02-05 DIAGNOSIS — Z299 Encounter for prophylactic measures, unspecified: Secondary | ICD-10-CM | POA: Diagnosis not present

## 2019-02-08 DIAGNOSIS — Z299 Encounter for prophylactic measures, unspecified: Secondary | ICD-10-CM | POA: Diagnosis not present

## 2019-02-08 DIAGNOSIS — E1142 Type 2 diabetes mellitus with diabetic polyneuropathy: Secondary | ICD-10-CM | POA: Diagnosis not present

## 2019-02-08 DIAGNOSIS — I82409 Acute embolism and thrombosis of unspecified deep veins of unspecified lower extremity: Secondary | ICD-10-CM | POA: Diagnosis not present

## 2019-02-08 DIAGNOSIS — E1165 Type 2 diabetes mellitus with hyperglycemia: Secondary | ICD-10-CM | POA: Diagnosis not present

## 2019-02-08 DIAGNOSIS — I509 Heart failure, unspecified: Secondary | ICD-10-CM | POA: Diagnosis not present

## 2019-02-08 DIAGNOSIS — Z6833 Body mass index (BMI) 33.0-33.9, adult: Secondary | ICD-10-CM | POA: Diagnosis not present

## 2019-02-08 DIAGNOSIS — I739 Peripheral vascular disease, unspecified: Secondary | ICD-10-CM | POA: Diagnosis not present

## 2019-02-08 DIAGNOSIS — M1710 Unilateral primary osteoarthritis, unspecified knee: Secondary | ICD-10-CM | POA: Diagnosis not present

## 2019-02-08 DIAGNOSIS — M1711 Unilateral primary osteoarthritis, right knee: Secondary | ICD-10-CM | POA: Diagnosis not present

## 2019-02-11 DIAGNOSIS — K458 Other specified abdominal hernia without obstruction or gangrene: Secondary | ICD-10-CM | POA: Diagnosis not present

## 2019-02-11 DIAGNOSIS — Z48815 Encounter for surgical aftercare following surgery on the digestive system: Secondary | ICD-10-CM | POA: Diagnosis not present

## 2019-02-11 DIAGNOSIS — Z8719 Personal history of other diseases of the digestive system: Secondary | ICD-10-CM | POA: Diagnosis not present

## 2019-02-11 DIAGNOSIS — Z9889 Other specified postprocedural states: Secondary | ICD-10-CM | POA: Diagnosis not present

## 2019-03-03 ENCOUNTER — Encounter (INDEPENDENT_AMBULATORY_CARE_PROVIDER_SITE_OTHER): Payer: Medicare HMO | Admitting: Ophthalmology

## 2019-03-04 ENCOUNTER — Encounter (INDEPENDENT_AMBULATORY_CARE_PROVIDER_SITE_OTHER): Payer: Medicare HMO | Admitting: Ophthalmology

## 2019-03-04 ENCOUNTER — Other Ambulatory Visit: Payer: Self-pay

## 2019-03-04 DIAGNOSIS — I10 Essential (primary) hypertension: Secondary | ICD-10-CM | POA: Diagnosis not present

## 2019-03-04 DIAGNOSIS — H35033 Hypertensive retinopathy, bilateral: Secondary | ICD-10-CM | POA: Diagnosis not present

## 2019-03-04 DIAGNOSIS — E113393 Type 2 diabetes mellitus with moderate nonproliferative diabetic retinopathy without macular edema, bilateral: Secondary | ICD-10-CM | POA: Diagnosis not present

## 2019-03-04 DIAGNOSIS — E11319 Type 2 diabetes mellitus with unspecified diabetic retinopathy without macular edema: Secondary | ICD-10-CM

## 2019-03-15 DIAGNOSIS — E1165 Type 2 diabetes mellitus with hyperglycemia: Secondary | ICD-10-CM | POA: Diagnosis not present

## 2019-03-15 DIAGNOSIS — I739 Peripheral vascular disease, unspecified: Secondary | ICD-10-CM | POA: Diagnosis not present

## 2019-03-15 DIAGNOSIS — I509 Heart failure, unspecified: Secondary | ICD-10-CM | POA: Diagnosis not present

## 2019-03-15 DIAGNOSIS — Z6833 Body mass index (BMI) 33.0-33.9, adult: Secondary | ICD-10-CM | POA: Diagnosis not present

## 2019-03-15 DIAGNOSIS — I10 Essential (primary) hypertension: Secondary | ICD-10-CM | POA: Diagnosis not present

## 2019-03-15 DIAGNOSIS — Z299 Encounter for prophylactic measures, unspecified: Secondary | ICD-10-CM | POA: Diagnosis not present

## 2019-03-15 DIAGNOSIS — I82409 Acute embolism and thrombosis of unspecified deep veins of unspecified lower extremity: Secondary | ICD-10-CM | POA: Diagnosis not present

## 2019-03-25 DIAGNOSIS — E1165 Type 2 diabetes mellitus with hyperglycemia: Secondary | ICD-10-CM | POA: Diagnosis not present

## 2019-03-25 DIAGNOSIS — I739 Peripheral vascular disease, unspecified: Secondary | ICD-10-CM | POA: Diagnosis not present

## 2019-03-25 DIAGNOSIS — Z6833 Body mass index (BMI) 33.0-33.9, adult: Secondary | ICD-10-CM | POA: Diagnosis not present

## 2019-03-25 DIAGNOSIS — I10 Essential (primary) hypertension: Secondary | ICD-10-CM | POA: Diagnosis not present

## 2019-03-25 DIAGNOSIS — Z299 Encounter for prophylactic measures, unspecified: Secondary | ICD-10-CM | POA: Diagnosis not present

## 2019-03-25 DIAGNOSIS — K529 Noninfective gastroenteritis and colitis, unspecified: Secondary | ICD-10-CM | POA: Diagnosis not present

## 2019-03-25 DIAGNOSIS — I82409 Acute embolism and thrombosis of unspecified deep veins of unspecified lower extremity: Secondary | ICD-10-CM | POA: Diagnosis not present

## 2019-04-01 DIAGNOSIS — I83012 Varicose veins of right lower extremity with ulcer of calf: Secondary | ICD-10-CM | POA: Diagnosis not present

## 2019-04-01 DIAGNOSIS — I83022 Varicose veins of left lower extremity with ulcer of calf: Secondary | ICD-10-CM | POA: Diagnosis not present

## 2019-04-05 DIAGNOSIS — Z6833 Body mass index (BMI) 33.0-33.9, adult: Secondary | ICD-10-CM | POA: Diagnosis not present

## 2019-04-05 DIAGNOSIS — Z299 Encounter for prophylactic measures, unspecified: Secondary | ICD-10-CM | POA: Diagnosis not present

## 2019-04-05 DIAGNOSIS — I82409 Acute embolism and thrombosis of unspecified deep veins of unspecified lower extremity: Secondary | ICD-10-CM | POA: Diagnosis not present

## 2019-04-05 DIAGNOSIS — I10 Essential (primary) hypertension: Secondary | ICD-10-CM | POA: Diagnosis not present

## 2019-04-05 DIAGNOSIS — I509 Heart failure, unspecified: Secondary | ICD-10-CM | POA: Diagnosis not present

## 2019-04-08 DIAGNOSIS — I83022 Varicose veins of left lower extremity with ulcer of calf: Secondary | ICD-10-CM | POA: Diagnosis not present

## 2019-04-08 DIAGNOSIS — I83012 Varicose veins of right lower extremity with ulcer of calf: Secondary | ICD-10-CM | POA: Diagnosis not present

## 2019-04-15 DIAGNOSIS — M79673 Pain in unspecified foot: Secondary | ICD-10-CM | POA: Diagnosis not present

## 2019-04-15 DIAGNOSIS — I83022 Varicose veins of left lower extremity with ulcer of calf: Secondary | ICD-10-CM | POA: Diagnosis not present

## 2019-04-15 DIAGNOSIS — I83012 Varicose veins of right lower extremity with ulcer of calf: Secondary | ICD-10-CM | POA: Diagnosis not present

## 2019-04-22 DIAGNOSIS — I83022 Varicose veins of left lower extremity with ulcer of calf: Secondary | ICD-10-CM | POA: Diagnosis not present

## 2019-04-22 DIAGNOSIS — I83012 Varicose veins of right lower extremity with ulcer of calf: Secondary | ICD-10-CM | POA: Diagnosis not present

## 2019-05-05 DIAGNOSIS — S90821A Blister (nonthermal), right foot, initial encounter: Secondary | ICD-10-CM | POA: Diagnosis not present

## 2019-05-05 DIAGNOSIS — I10 Essential (primary) hypertension: Secondary | ICD-10-CM | POA: Diagnosis not present

## 2019-05-05 DIAGNOSIS — Z299 Encounter for prophylactic measures, unspecified: Secondary | ICD-10-CM | POA: Diagnosis not present

## 2019-05-05 DIAGNOSIS — Z6833 Body mass index (BMI) 33.0-33.9, adult: Secondary | ICD-10-CM | POA: Diagnosis not present

## 2019-05-05 DIAGNOSIS — E1165 Type 2 diabetes mellitus with hyperglycemia: Secondary | ICD-10-CM | POA: Diagnosis not present

## 2019-05-05 DIAGNOSIS — I82409 Acute embolism and thrombosis of unspecified deep veins of unspecified lower extremity: Secondary | ICD-10-CM | POA: Diagnosis not present

## 2019-05-06 ENCOUNTER — Other Ambulatory Visit: Payer: Self-pay

## 2019-05-06 DIAGNOSIS — M7989 Other specified soft tissue disorders: Secondary | ICD-10-CM | POA: Diagnosis not present

## 2019-05-06 DIAGNOSIS — I890 Lymphedema, not elsewhere classified: Secondary | ICD-10-CM | POA: Diagnosis not present

## 2019-05-06 DIAGNOSIS — L03116 Cellulitis of left lower limb: Secondary | ICD-10-CM | POA: Diagnosis not present

## 2019-05-06 DIAGNOSIS — L03115 Cellulitis of right lower limb: Secondary | ICD-10-CM | POA: Diagnosis not present

## 2019-05-06 DIAGNOSIS — I252 Old myocardial infarction: Secondary | ICD-10-CM | POA: Diagnosis not present

## 2019-05-06 DIAGNOSIS — Z86718 Personal history of other venous thrombosis and embolism: Secondary | ICD-10-CM | POA: Diagnosis not present

## 2019-05-06 DIAGNOSIS — I10 Essential (primary) hypertension: Secondary | ICD-10-CM | POA: Diagnosis not present

## 2019-05-06 DIAGNOSIS — S90821A Blister (nonthermal), right foot, initial encounter: Secondary | ICD-10-CM | POA: Diagnosis not present

## 2019-05-06 DIAGNOSIS — E119 Type 2 diabetes mellitus without complications: Secondary | ICD-10-CM | POA: Diagnosis not present

## 2019-05-11 DIAGNOSIS — I890 Lymphedema, not elsewhere classified: Secondary | ICD-10-CM | POA: Diagnosis not present

## 2019-05-11 DIAGNOSIS — S90821A Blister (nonthermal), right foot, initial encounter: Secondary | ICD-10-CM | POA: Diagnosis not present

## 2019-05-11 DIAGNOSIS — I252 Old myocardial infarction: Secondary | ICD-10-CM | POA: Diagnosis not present

## 2019-05-11 DIAGNOSIS — L03115 Cellulitis of right lower limb: Secondary | ICD-10-CM | POA: Diagnosis not present

## 2019-05-11 DIAGNOSIS — M7989 Other specified soft tissue disorders: Secondary | ICD-10-CM | POA: Diagnosis not present

## 2019-05-11 DIAGNOSIS — I10 Essential (primary) hypertension: Secondary | ICD-10-CM | POA: Diagnosis not present

## 2019-05-11 DIAGNOSIS — Z86718 Personal history of other venous thrombosis and embolism: Secondary | ICD-10-CM | POA: Diagnosis not present

## 2019-05-11 DIAGNOSIS — L03116 Cellulitis of left lower limb: Secondary | ICD-10-CM | POA: Diagnosis not present

## 2019-05-11 DIAGNOSIS — E119 Type 2 diabetes mellitus without complications: Secondary | ICD-10-CM | POA: Diagnosis not present

## 2019-05-17 DIAGNOSIS — R2243 Localized swelling, mass and lump, lower limb, bilateral: Secondary | ICD-10-CM | POA: Diagnosis not present

## 2019-05-17 DIAGNOSIS — E782 Mixed hyperlipidemia: Secondary | ICD-10-CM | POA: Diagnosis not present

## 2019-05-17 DIAGNOSIS — E119 Type 2 diabetes mellitus without complications: Secondary | ICD-10-CM | POA: Diagnosis not present

## 2019-05-17 DIAGNOSIS — I10 Essential (primary) hypertension: Secondary | ICD-10-CM | POA: Diagnosis not present

## 2019-05-17 DIAGNOSIS — I872 Venous insufficiency (chronic) (peripheral): Secondary | ICD-10-CM | POA: Diagnosis not present

## 2019-05-17 DIAGNOSIS — K439 Ventral hernia without obstruction or gangrene: Secondary | ICD-10-CM | POA: Diagnosis not present

## 2019-05-17 DIAGNOSIS — I219 Acute myocardial infarction, unspecified: Secondary | ICD-10-CM | POA: Diagnosis not present

## 2019-05-17 DIAGNOSIS — M7989 Other specified soft tissue disorders: Secondary | ICD-10-CM | POA: Diagnosis not present

## 2019-05-17 DIAGNOSIS — L97919 Non-pressure chronic ulcer of unspecified part of right lower leg with unspecified severity: Secondary | ICD-10-CM | POA: Diagnosis not present

## 2019-05-17 DIAGNOSIS — I4891 Unspecified atrial fibrillation: Secondary | ICD-10-CM | POA: Diagnosis not present

## 2019-05-19 DIAGNOSIS — E1165 Type 2 diabetes mellitus with hyperglycemia: Secondary | ICD-10-CM | POA: Diagnosis not present

## 2019-05-19 DIAGNOSIS — M549 Dorsalgia, unspecified: Secondary | ICD-10-CM | POA: Diagnosis not present

## 2019-05-19 DIAGNOSIS — Z299 Encounter for prophylactic measures, unspecified: Secondary | ICD-10-CM | POA: Diagnosis not present

## 2019-05-19 DIAGNOSIS — I10 Essential (primary) hypertension: Secondary | ICD-10-CM | POA: Diagnosis not present

## 2019-05-19 DIAGNOSIS — S90821A Blister (nonthermal), right foot, initial encounter: Secondary | ICD-10-CM | POA: Diagnosis not present

## 2019-05-19 DIAGNOSIS — S91301A Unspecified open wound, right foot, initial encounter: Secondary | ICD-10-CM | POA: Diagnosis not present

## 2019-05-19 DIAGNOSIS — M79672 Pain in left foot: Secondary | ICD-10-CM | POA: Diagnosis not present

## 2019-05-19 DIAGNOSIS — L03119 Cellulitis of unspecified part of limb: Secondary | ICD-10-CM | POA: Diagnosis not present

## 2019-05-19 DIAGNOSIS — I82409 Acute embolism and thrombosis of unspecified deep veins of unspecified lower extremity: Secondary | ICD-10-CM | POA: Diagnosis not present

## 2019-05-19 DIAGNOSIS — R2243 Localized swelling, mass and lump, lower limb, bilateral: Secondary | ICD-10-CM | POA: Diagnosis not present

## 2019-05-19 DIAGNOSIS — I890 Lymphedema, not elsewhere classified: Secondary | ICD-10-CM | POA: Diagnosis not present

## 2019-05-19 DIAGNOSIS — Z6833 Body mass index (BMI) 33.0-33.9, adult: Secondary | ICD-10-CM | POA: Diagnosis not present

## 2019-05-20 DIAGNOSIS — I10 Essential (primary) hypertension: Secondary | ICD-10-CM | POA: Diagnosis not present

## 2019-05-20 DIAGNOSIS — I219 Acute myocardial infarction, unspecified: Secondary | ICD-10-CM | POA: Diagnosis not present

## 2019-05-20 DIAGNOSIS — K439 Ventral hernia without obstruction or gangrene: Secondary | ICD-10-CM | POA: Diagnosis not present

## 2019-05-20 DIAGNOSIS — I4891 Unspecified atrial fibrillation: Secondary | ICD-10-CM | POA: Diagnosis not present

## 2019-05-20 DIAGNOSIS — E782 Mixed hyperlipidemia: Secondary | ICD-10-CM | POA: Diagnosis not present

## 2019-05-20 DIAGNOSIS — I872 Venous insufficiency (chronic) (peripheral): Secondary | ICD-10-CM | POA: Diagnosis not present

## 2019-05-20 DIAGNOSIS — L97919 Non-pressure chronic ulcer of unspecified part of right lower leg with unspecified severity: Secondary | ICD-10-CM | POA: Diagnosis not present

## 2019-05-20 DIAGNOSIS — E119 Type 2 diabetes mellitus without complications: Secondary | ICD-10-CM | POA: Diagnosis not present

## 2019-05-24 DIAGNOSIS — E119 Type 2 diabetes mellitus without complications: Secondary | ICD-10-CM | POA: Diagnosis not present

## 2019-05-24 DIAGNOSIS — I872 Venous insufficiency (chronic) (peripheral): Secondary | ICD-10-CM | POA: Diagnosis not present

## 2019-05-24 DIAGNOSIS — K439 Ventral hernia without obstruction or gangrene: Secondary | ICD-10-CM | POA: Diagnosis not present

## 2019-05-24 DIAGNOSIS — L97919 Non-pressure chronic ulcer of unspecified part of right lower leg with unspecified severity: Secondary | ICD-10-CM | POA: Diagnosis not present

## 2019-05-24 DIAGNOSIS — I10 Essential (primary) hypertension: Secondary | ICD-10-CM | POA: Diagnosis not present

## 2019-05-24 DIAGNOSIS — E782 Mixed hyperlipidemia: Secondary | ICD-10-CM | POA: Diagnosis not present

## 2019-05-24 DIAGNOSIS — I4891 Unspecified atrial fibrillation: Secondary | ICD-10-CM | POA: Diagnosis not present

## 2019-05-24 DIAGNOSIS — I219 Acute myocardial infarction, unspecified: Secondary | ICD-10-CM | POA: Diagnosis not present

## 2019-05-25 DIAGNOSIS — Z299 Encounter for prophylactic measures, unspecified: Secondary | ICD-10-CM | POA: Diagnosis not present

## 2019-05-25 DIAGNOSIS — Z789 Other specified health status: Secondary | ICD-10-CM | POA: Diagnosis not present

## 2019-05-25 DIAGNOSIS — M1711 Unilateral primary osteoarthritis, right knee: Secondary | ICD-10-CM | POA: Diagnosis not present

## 2019-05-25 DIAGNOSIS — M1712 Unilateral primary osteoarthritis, left knee: Secondary | ICD-10-CM | POA: Diagnosis not present

## 2019-05-25 DIAGNOSIS — Z6833 Body mass index (BMI) 33.0-33.9, adult: Secondary | ICD-10-CM | POA: Diagnosis not present

## 2019-05-26 DIAGNOSIS — E119 Type 2 diabetes mellitus without complications: Secondary | ICD-10-CM | POA: Diagnosis not present

## 2019-05-26 DIAGNOSIS — I4891 Unspecified atrial fibrillation: Secondary | ICD-10-CM | POA: Diagnosis not present

## 2019-05-26 DIAGNOSIS — E782 Mixed hyperlipidemia: Secondary | ICD-10-CM | POA: Diagnosis not present

## 2019-05-26 DIAGNOSIS — I219 Acute myocardial infarction, unspecified: Secondary | ICD-10-CM | POA: Diagnosis not present

## 2019-05-26 DIAGNOSIS — K439 Ventral hernia without obstruction or gangrene: Secondary | ICD-10-CM | POA: Diagnosis not present

## 2019-05-26 DIAGNOSIS — L97919 Non-pressure chronic ulcer of unspecified part of right lower leg with unspecified severity: Secondary | ICD-10-CM | POA: Diagnosis not present

## 2019-05-26 DIAGNOSIS — I872 Venous insufficiency (chronic) (peripheral): Secondary | ICD-10-CM | POA: Diagnosis not present

## 2019-05-26 DIAGNOSIS — I10 Essential (primary) hypertension: Secondary | ICD-10-CM | POA: Diagnosis not present

## 2019-05-28 DIAGNOSIS — E119 Type 2 diabetes mellitus without complications: Secondary | ICD-10-CM | POA: Diagnosis not present

## 2019-05-28 DIAGNOSIS — E782 Mixed hyperlipidemia: Secondary | ICD-10-CM | POA: Diagnosis not present

## 2019-05-28 DIAGNOSIS — L97919 Non-pressure chronic ulcer of unspecified part of right lower leg with unspecified severity: Secondary | ICD-10-CM | POA: Diagnosis not present

## 2019-05-28 DIAGNOSIS — I4891 Unspecified atrial fibrillation: Secondary | ICD-10-CM | POA: Diagnosis not present

## 2019-05-28 DIAGNOSIS — I219 Acute myocardial infarction, unspecified: Secondary | ICD-10-CM | POA: Diagnosis not present

## 2019-05-28 DIAGNOSIS — K439 Ventral hernia without obstruction or gangrene: Secondary | ICD-10-CM | POA: Diagnosis not present

## 2019-05-28 DIAGNOSIS — I872 Venous insufficiency (chronic) (peripheral): Secondary | ICD-10-CM | POA: Diagnosis not present

## 2019-05-28 DIAGNOSIS — I10 Essential (primary) hypertension: Secondary | ICD-10-CM | POA: Diagnosis not present

## 2019-05-31 DIAGNOSIS — K439 Ventral hernia without obstruction or gangrene: Secondary | ICD-10-CM | POA: Diagnosis not present

## 2019-05-31 DIAGNOSIS — I872 Venous insufficiency (chronic) (peripheral): Secondary | ICD-10-CM | POA: Diagnosis not present

## 2019-05-31 DIAGNOSIS — I4891 Unspecified atrial fibrillation: Secondary | ICD-10-CM | POA: Diagnosis not present

## 2019-05-31 DIAGNOSIS — I10 Essential (primary) hypertension: Secondary | ICD-10-CM | POA: Diagnosis not present

## 2019-05-31 DIAGNOSIS — I219 Acute myocardial infarction, unspecified: Secondary | ICD-10-CM | POA: Diagnosis not present

## 2019-05-31 DIAGNOSIS — E782 Mixed hyperlipidemia: Secondary | ICD-10-CM | POA: Diagnosis not present

## 2019-05-31 DIAGNOSIS — E119 Type 2 diabetes mellitus without complications: Secondary | ICD-10-CM | POA: Diagnosis not present

## 2019-05-31 DIAGNOSIS — L97919 Non-pressure chronic ulcer of unspecified part of right lower leg with unspecified severity: Secondary | ICD-10-CM | POA: Diagnosis not present

## 2019-06-02 DIAGNOSIS — E1165 Type 2 diabetes mellitus with hyperglycemia: Secondary | ICD-10-CM | POA: Diagnosis not present

## 2019-06-02 DIAGNOSIS — E1142 Type 2 diabetes mellitus with diabetic polyneuropathy: Secondary | ICD-10-CM | POA: Diagnosis not present

## 2019-06-02 DIAGNOSIS — I10 Essential (primary) hypertension: Secondary | ICD-10-CM | POA: Diagnosis not present

## 2019-06-02 DIAGNOSIS — I80209 Phlebitis and thrombophlebitis of unspecified deep vessels of unspecified lower extremity: Secondary | ICD-10-CM | POA: Diagnosis not present

## 2019-06-02 DIAGNOSIS — Z299 Encounter for prophylactic measures, unspecified: Secondary | ICD-10-CM | POA: Diagnosis not present

## 2019-06-02 DIAGNOSIS — I872 Venous insufficiency (chronic) (peripheral): Secondary | ICD-10-CM | POA: Diagnosis not present

## 2019-06-04 DIAGNOSIS — I4891 Unspecified atrial fibrillation: Secondary | ICD-10-CM | POA: Diagnosis not present

## 2019-06-04 DIAGNOSIS — I10 Essential (primary) hypertension: Secondary | ICD-10-CM | POA: Diagnosis not present

## 2019-06-04 DIAGNOSIS — E119 Type 2 diabetes mellitus without complications: Secondary | ICD-10-CM | POA: Diagnosis not present

## 2019-06-04 DIAGNOSIS — L97919 Non-pressure chronic ulcer of unspecified part of right lower leg with unspecified severity: Secondary | ICD-10-CM | POA: Diagnosis not present

## 2019-06-04 DIAGNOSIS — I872 Venous insufficiency (chronic) (peripheral): Secondary | ICD-10-CM | POA: Diagnosis not present

## 2019-06-04 DIAGNOSIS — E782 Mixed hyperlipidemia: Secondary | ICD-10-CM | POA: Diagnosis not present

## 2019-06-04 DIAGNOSIS — K439 Ventral hernia without obstruction or gangrene: Secondary | ICD-10-CM | POA: Diagnosis not present

## 2019-06-04 DIAGNOSIS — I219 Acute myocardial infarction, unspecified: Secondary | ICD-10-CM | POA: Diagnosis not present

## 2019-06-07 DIAGNOSIS — E119 Type 2 diabetes mellitus without complications: Secondary | ICD-10-CM | POA: Diagnosis not present

## 2019-06-07 DIAGNOSIS — I872 Venous insufficiency (chronic) (peripheral): Secondary | ICD-10-CM | POA: Diagnosis not present

## 2019-06-07 DIAGNOSIS — L97919 Non-pressure chronic ulcer of unspecified part of right lower leg with unspecified severity: Secondary | ICD-10-CM | POA: Diagnosis not present

## 2019-06-07 DIAGNOSIS — E782 Mixed hyperlipidemia: Secondary | ICD-10-CM | POA: Diagnosis not present

## 2019-06-07 DIAGNOSIS — K439 Ventral hernia without obstruction or gangrene: Secondary | ICD-10-CM | POA: Diagnosis not present

## 2019-06-07 DIAGNOSIS — I219 Acute myocardial infarction, unspecified: Secondary | ICD-10-CM | POA: Diagnosis not present

## 2019-06-07 DIAGNOSIS — I10 Essential (primary) hypertension: Secondary | ICD-10-CM | POA: Diagnosis not present

## 2019-06-07 DIAGNOSIS — I4891 Unspecified atrial fibrillation: Secondary | ICD-10-CM | POA: Diagnosis not present

## 2019-06-09 DIAGNOSIS — E119 Type 2 diabetes mellitus without complications: Secondary | ICD-10-CM | POA: Diagnosis not present

## 2019-06-09 DIAGNOSIS — I4891 Unspecified atrial fibrillation: Secondary | ICD-10-CM | POA: Diagnosis not present

## 2019-06-09 DIAGNOSIS — L97919 Non-pressure chronic ulcer of unspecified part of right lower leg with unspecified severity: Secondary | ICD-10-CM | POA: Diagnosis not present

## 2019-06-09 DIAGNOSIS — I219 Acute myocardial infarction, unspecified: Secondary | ICD-10-CM | POA: Diagnosis not present

## 2019-06-09 DIAGNOSIS — Z299 Encounter for prophylactic measures, unspecified: Secondary | ICD-10-CM | POA: Diagnosis not present

## 2019-06-09 DIAGNOSIS — I10 Essential (primary) hypertension: Secondary | ICD-10-CM | POA: Diagnosis not present

## 2019-06-09 DIAGNOSIS — R262 Difficulty in walking, not elsewhere classified: Secondary | ICD-10-CM | POA: Diagnosis not present

## 2019-06-09 DIAGNOSIS — K439 Ventral hernia without obstruction or gangrene: Secondary | ICD-10-CM | POA: Diagnosis not present

## 2019-06-09 DIAGNOSIS — I872 Venous insufficiency (chronic) (peripheral): Secondary | ICD-10-CM | POA: Diagnosis not present

## 2019-06-09 DIAGNOSIS — E782 Mixed hyperlipidemia: Secondary | ICD-10-CM | POA: Diagnosis not present

## 2019-06-09 DIAGNOSIS — Z6833 Body mass index (BMI) 33.0-33.9, adult: Secondary | ICD-10-CM | POA: Diagnosis not present

## 2019-06-11 DIAGNOSIS — I10 Essential (primary) hypertension: Secondary | ICD-10-CM | POA: Diagnosis not present

## 2019-06-11 DIAGNOSIS — I872 Venous insufficiency (chronic) (peripheral): Secondary | ICD-10-CM | POA: Diagnosis not present

## 2019-06-11 DIAGNOSIS — I4891 Unspecified atrial fibrillation: Secondary | ICD-10-CM | POA: Diagnosis not present

## 2019-06-11 DIAGNOSIS — L97919 Non-pressure chronic ulcer of unspecified part of right lower leg with unspecified severity: Secondary | ICD-10-CM | POA: Diagnosis not present

## 2019-06-11 DIAGNOSIS — E119 Type 2 diabetes mellitus without complications: Secondary | ICD-10-CM | POA: Diagnosis not present

## 2019-06-11 DIAGNOSIS — I219 Acute myocardial infarction, unspecified: Secondary | ICD-10-CM | POA: Diagnosis not present

## 2019-06-11 DIAGNOSIS — E782 Mixed hyperlipidemia: Secondary | ICD-10-CM | POA: Diagnosis not present

## 2019-06-11 DIAGNOSIS — K439 Ventral hernia without obstruction or gangrene: Secondary | ICD-10-CM | POA: Diagnosis not present

## 2019-06-14 DIAGNOSIS — E119 Type 2 diabetes mellitus without complications: Secondary | ICD-10-CM | POA: Diagnosis not present

## 2019-06-14 DIAGNOSIS — E782 Mixed hyperlipidemia: Secondary | ICD-10-CM | POA: Diagnosis not present

## 2019-06-14 DIAGNOSIS — I10 Essential (primary) hypertension: Secondary | ICD-10-CM | POA: Diagnosis not present

## 2019-06-14 DIAGNOSIS — I4891 Unspecified atrial fibrillation: Secondary | ICD-10-CM | POA: Diagnosis not present

## 2019-06-14 DIAGNOSIS — I219 Acute myocardial infarction, unspecified: Secondary | ICD-10-CM | POA: Diagnosis not present

## 2019-06-14 DIAGNOSIS — K439 Ventral hernia without obstruction or gangrene: Secondary | ICD-10-CM | POA: Diagnosis not present

## 2019-06-14 DIAGNOSIS — L97919 Non-pressure chronic ulcer of unspecified part of right lower leg with unspecified severity: Secondary | ICD-10-CM | POA: Diagnosis not present

## 2019-06-14 DIAGNOSIS — I872 Venous insufficiency (chronic) (peripheral): Secondary | ICD-10-CM | POA: Diagnosis not present

## 2019-06-16 DIAGNOSIS — L97919 Non-pressure chronic ulcer of unspecified part of right lower leg with unspecified severity: Secondary | ICD-10-CM | POA: Diagnosis not present

## 2019-06-16 DIAGNOSIS — I872 Venous insufficiency (chronic) (peripheral): Secondary | ICD-10-CM | POA: Diagnosis not present

## 2019-06-16 DIAGNOSIS — I4891 Unspecified atrial fibrillation: Secondary | ICD-10-CM | POA: Diagnosis not present

## 2019-06-16 DIAGNOSIS — I219 Acute myocardial infarction, unspecified: Secondary | ICD-10-CM | POA: Diagnosis not present

## 2019-06-16 DIAGNOSIS — E782 Mixed hyperlipidemia: Secondary | ICD-10-CM | POA: Diagnosis not present

## 2019-06-16 DIAGNOSIS — K439 Ventral hernia without obstruction or gangrene: Secondary | ICD-10-CM | POA: Diagnosis not present

## 2019-06-16 DIAGNOSIS — E119 Type 2 diabetes mellitus without complications: Secondary | ICD-10-CM | POA: Diagnosis not present

## 2019-06-16 DIAGNOSIS — I10 Essential (primary) hypertension: Secondary | ICD-10-CM | POA: Diagnosis not present

## 2019-06-18 DIAGNOSIS — L97919 Non-pressure chronic ulcer of unspecified part of right lower leg with unspecified severity: Secondary | ICD-10-CM | POA: Diagnosis not present

## 2019-06-18 DIAGNOSIS — I10 Essential (primary) hypertension: Secondary | ICD-10-CM | POA: Diagnosis not present

## 2019-06-18 DIAGNOSIS — I872 Venous insufficiency (chronic) (peripheral): Secondary | ICD-10-CM | POA: Diagnosis not present

## 2019-06-18 DIAGNOSIS — K439 Ventral hernia without obstruction or gangrene: Secondary | ICD-10-CM | POA: Diagnosis not present

## 2019-06-18 DIAGNOSIS — I4891 Unspecified atrial fibrillation: Secondary | ICD-10-CM | POA: Diagnosis not present

## 2019-06-18 DIAGNOSIS — E119 Type 2 diabetes mellitus without complications: Secondary | ICD-10-CM | POA: Diagnosis not present

## 2019-06-18 DIAGNOSIS — E782 Mixed hyperlipidemia: Secondary | ICD-10-CM | POA: Diagnosis not present

## 2019-06-18 DIAGNOSIS — I219 Acute myocardial infarction, unspecified: Secondary | ICD-10-CM | POA: Diagnosis not present

## 2019-06-21 DIAGNOSIS — E119 Type 2 diabetes mellitus without complications: Secondary | ICD-10-CM | POA: Diagnosis not present

## 2019-06-21 DIAGNOSIS — I4891 Unspecified atrial fibrillation: Secondary | ICD-10-CM | POA: Diagnosis not present

## 2019-06-21 DIAGNOSIS — I10 Essential (primary) hypertension: Secondary | ICD-10-CM | POA: Diagnosis not present

## 2019-06-21 DIAGNOSIS — L97919 Non-pressure chronic ulcer of unspecified part of right lower leg with unspecified severity: Secondary | ICD-10-CM | POA: Diagnosis not present

## 2019-06-21 DIAGNOSIS — I872 Venous insufficiency (chronic) (peripheral): Secondary | ICD-10-CM | POA: Diagnosis not present

## 2019-06-21 DIAGNOSIS — I219 Acute myocardial infarction, unspecified: Secondary | ICD-10-CM | POA: Diagnosis not present

## 2019-06-21 DIAGNOSIS — K439 Ventral hernia without obstruction or gangrene: Secondary | ICD-10-CM | POA: Diagnosis not present

## 2019-06-21 DIAGNOSIS — E782 Mixed hyperlipidemia: Secondary | ICD-10-CM | POA: Diagnosis not present

## 2019-06-23 DIAGNOSIS — E782 Mixed hyperlipidemia: Secondary | ICD-10-CM | POA: Diagnosis not present

## 2019-06-23 DIAGNOSIS — I219 Acute myocardial infarction, unspecified: Secondary | ICD-10-CM | POA: Diagnosis not present

## 2019-06-23 DIAGNOSIS — K439 Ventral hernia without obstruction or gangrene: Secondary | ICD-10-CM | POA: Diagnosis not present

## 2019-06-23 DIAGNOSIS — E119 Type 2 diabetes mellitus without complications: Secondary | ICD-10-CM | POA: Diagnosis not present

## 2019-06-23 DIAGNOSIS — I4891 Unspecified atrial fibrillation: Secondary | ICD-10-CM | POA: Diagnosis not present

## 2019-06-23 DIAGNOSIS — I10 Essential (primary) hypertension: Secondary | ICD-10-CM | POA: Diagnosis not present

## 2019-06-23 DIAGNOSIS — L97919 Non-pressure chronic ulcer of unspecified part of right lower leg with unspecified severity: Secondary | ICD-10-CM | POA: Diagnosis not present

## 2019-06-23 DIAGNOSIS — I872 Venous insufficiency (chronic) (peripheral): Secondary | ICD-10-CM | POA: Diagnosis not present

## 2019-07-01 DIAGNOSIS — I83012 Varicose veins of right lower extremity with ulcer of calf: Secondary | ICD-10-CM | POA: Diagnosis not present

## 2019-07-01 DIAGNOSIS — I83022 Varicose veins of left lower extremity with ulcer of calf: Secondary | ICD-10-CM | POA: Diagnosis not present

## 2019-07-08 DIAGNOSIS — I83012 Varicose veins of right lower extremity with ulcer of calf: Secondary | ICD-10-CM | POA: Diagnosis not present

## 2019-07-08 DIAGNOSIS — I83022 Varicose veins of left lower extremity with ulcer of calf: Secondary | ICD-10-CM | POA: Diagnosis not present

## 2019-07-14 DIAGNOSIS — Z299 Encounter for prophylactic measures, unspecified: Secondary | ICD-10-CM | POA: Diagnosis not present

## 2019-07-14 DIAGNOSIS — I10 Essential (primary) hypertension: Secondary | ICD-10-CM | POA: Diagnosis not present

## 2019-07-14 DIAGNOSIS — I80209 Phlebitis and thrombophlebitis of unspecified deep vessels of unspecified lower extremity: Secondary | ICD-10-CM | POA: Diagnosis not present

## 2019-07-14 DIAGNOSIS — Z6833 Body mass index (BMI) 33.0-33.9, adult: Secondary | ICD-10-CM | POA: Diagnosis not present

## 2019-07-14 DIAGNOSIS — E1165 Type 2 diabetes mellitus with hyperglycemia: Secondary | ICD-10-CM | POA: Diagnosis not present

## 2019-07-14 DIAGNOSIS — E1142 Type 2 diabetes mellitus with diabetic polyneuropathy: Secondary | ICD-10-CM | POA: Diagnosis not present

## 2019-07-14 DIAGNOSIS — I509 Heart failure, unspecified: Secondary | ICD-10-CM | POA: Diagnosis not present

## 2019-07-15 DIAGNOSIS — I872 Venous insufficiency (chronic) (peripheral): Secondary | ICD-10-CM | POA: Diagnosis not present

## 2019-07-15 DIAGNOSIS — Z7901 Long term (current) use of anticoagulants: Secondary | ICD-10-CM | POA: Diagnosis not present

## 2019-07-15 DIAGNOSIS — Z794 Long term (current) use of insulin: Secondary | ICD-10-CM | POA: Diagnosis not present

## 2019-07-15 DIAGNOSIS — L03115 Cellulitis of right lower limb: Secondary | ICD-10-CM | POA: Diagnosis not present

## 2019-07-15 DIAGNOSIS — L03116 Cellulitis of left lower limb: Secondary | ICD-10-CM | POA: Diagnosis not present

## 2019-07-15 DIAGNOSIS — E11622 Type 2 diabetes mellitus with other skin ulcer: Secondary | ICD-10-CM | POA: Diagnosis not present

## 2019-07-15 DIAGNOSIS — L97811 Non-pressure chronic ulcer of other part of right lower leg limited to breakdown of skin: Secondary | ICD-10-CM | POA: Diagnosis not present

## 2019-07-21 DIAGNOSIS — Z794 Long term (current) use of insulin: Secondary | ICD-10-CM | POA: Diagnosis not present

## 2019-07-21 DIAGNOSIS — L03116 Cellulitis of left lower limb: Secondary | ICD-10-CM | POA: Diagnosis not present

## 2019-07-21 DIAGNOSIS — L97811 Non-pressure chronic ulcer of other part of right lower leg limited to breakdown of skin: Secondary | ICD-10-CM | POA: Diagnosis not present

## 2019-07-21 DIAGNOSIS — I872 Venous insufficiency (chronic) (peripheral): Secondary | ICD-10-CM | POA: Diagnosis not present

## 2019-07-21 DIAGNOSIS — Z7901 Long term (current) use of anticoagulants: Secondary | ICD-10-CM | POA: Diagnosis not present

## 2019-07-21 DIAGNOSIS — L03115 Cellulitis of right lower limb: Secondary | ICD-10-CM | POA: Diagnosis not present

## 2019-07-21 DIAGNOSIS — E11622 Type 2 diabetes mellitus with other skin ulcer: Secondary | ICD-10-CM | POA: Diagnosis not present

## 2019-07-27 DIAGNOSIS — L03116 Cellulitis of left lower limb: Secondary | ICD-10-CM | POA: Diagnosis not present

## 2019-07-27 DIAGNOSIS — Z7901 Long term (current) use of anticoagulants: Secondary | ICD-10-CM | POA: Diagnosis not present

## 2019-07-27 DIAGNOSIS — L03115 Cellulitis of right lower limb: Secondary | ICD-10-CM | POA: Diagnosis not present

## 2019-07-27 DIAGNOSIS — I872 Venous insufficiency (chronic) (peripheral): Secondary | ICD-10-CM | POA: Diagnosis not present

## 2019-07-27 DIAGNOSIS — E11622 Type 2 diabetes mellitus with other skin ulcer: Secondary | ICD-10-CM | POA: Diagnosis not present

## 2019-07-27 DIAGNOSIS — Z794 Long term (current) use of insulin: Secondary | ICD-10-CM | POA: Diagnosis not present

## 2019-07-27 DIAGNOSIS — L97811 Non-pressure chronic ulcer of other part of right lower leg limited to breakdown of skin: Secondary | ICD-10-CM | POA: Diagnosis not present

## 2019-07-28 DIAGNOSIS — I872 Venous insufficiency (chronic) (peripheral): Secondary | ICD-10-CM | POA: Diagnosis not present

## 2019-07-28 DIAGNOSIS — Z7901 Long term (current) use of anticoagulants: Secondary | ICD-10-CM | POA: Diagnosis not present

## 2019-07-28 DIAGNOSIS — L03115 Cellulitis of right lower limb: Secondary | ICD-10-CM | POA: Diagnosis not present

## 2019-07-28 DIAGNOSIS — Z794 Long term (current) use of insulin: Secondary | ICD-10-CM | POA: Diagnosis not present

## 2019-07-28 DIAGNOSIS — E11622 Type 2 diabetes mellitus with other skin ulcer: Secondary | ICD-10-CM | POA: Diagnosis not present

## 2019-07-28 DIAGNOSIS — L97811 Non-pressure chronic ulcer of other part of right lower leg limited to breakdown of skin: Secondary | ICD-10-CM | POA: Diagnosis not present

## 2019-07-28 DIAGNOSIS — L03116 Cellulitis of left lower limb: Secondary | ICD-10-CM | POA: Diagnosis not present

## 2019-07-30 DIAGNOSIS — L97811 Non-pressure chronic ulcer of other part of right lower leg limited to breakdown of skin: Secondary | ICD-10-CM | POA: Diagnosis not present

## 2019-07-30 DIAGNOSIS — L03115 Cellulitis of right lower limb: Secondary | ICD-10-CM | POA: Diagnosis not present

## 2019-07-30 DIAGNOSIS — E11622 Type 2 diabetes mellitus with other skin ulcer: Secondary | ICD-10-CM | POA: Diagnosis not present

## 2019-07-30 DIAGNOSIS — Z7901 Long term (current) use of anticoagulants: Secondary | ICD-10-CM | POA: Diagnosis not present

## 2019-07-30 DIAGNOSIS — L03116 Cellulitis of left lower limb: Secondary | ICD-10-CM | POA: Diagnosis not present

## 2019-07-30 DIAGNOSIS — Z794 Long term (current) use of insulin: Secondary | ICD-10-CM | POA: Diagnosis not present

## 2019-07-30 DIAGNOSIS — I872 Venous insufficiency (chronic) (peripheral): Secondary | ICD-10-CM | POA: Diagnosis not present

## 2019-08-04 DIAGNOSIS — I872 Venous insufficiency (chronic) (peripheral): Secondary | ICD-10-CM | POA: Diagnosis not present

## 2019-08-04 DIAGNOSIS — L97811 Non-pressure chronic ulcer of other part of right lower leg limited to breakdown of skin: Secondary | ICD-10-CM | POA: Diagnosis not present

## 2019-08-04 DIAGNOSIS — Z7901 Long term (current) use of anticoagulants: Secondary | ICD-10-CM | POA: Diagnosis not present

## 2019-08-04 DIAGNOSIS — E11622 Type 2 diabetes mellitus with other skin ulcer: Secondary | ICD-10-CM | POA: Diagnosis not present

## 2019-08-04 DIAGNOSIS — Z794 Long term (current) use of insulin: Secondary | ICD-10-CM | POA: Diagnosis not present

## 2019-08-04 DIAGNOSIS — L03115 Cellulitis of right lower limb: Secondary | ICD-10-CM | POA: Diagnosis not present

## 2019-08-04 DIAGNOSIS — L03116 Cellulitis of left lower limb: Secondary | ICD-10-CM | POA: Diagnosis not present

## 2019-08-06 DIAGNOSIS — Z7901 Long term (current) use of anticoagulants: Secondary | ICD-10-CM | POA: Diagnosis not present

## 2019-08-06 DIAGNOSIS — Z794 Long term (current) use of insulin: Secondary | ICD-10-CM | POA: Diagnosis not present

## 2019-08-06 DIAGNOSIS — L03115 Cellulitis of right lower limb: Secondary | ICD-10-CM | POA: Diagnosis not present

## 2019-08-06 DIAGNOSIS — E11622 Type 2 diabetes mellitus with other skin ulcer: Secondary | ICD-10-CM | POA: Diagnosis not present

## 2019-08-06 DIAGNOSIS — L97811 Non-pressure chronic ulcer of other part of right lower leg limited to breakdown of skin: Secondary | ICD-10-CM | POA: Diagnosis not present

## 2019-08-06 DIAGNOSIS — I872 Venous insufficiency (chronic) (peripheral): Secondary | ICD-10-CM | POA: Diagnosis not present

## 2019-08-06 DIAGNOSIS — L03116 Cellulitis of left lower limb: Secondary | ICD-10-CM | POA: Diagnosis not present

## 2019-08-09 DIAGNOSIS — Z794 Long term (current) use of insulin: Secondary | ICD-10-CM | POA: Diagnosis not present

## 2019-08-09 DIAGNOSIS — L97811 Non-pressure chronic ulcer of other part of right lower leg limited to breakdown of skin: Secondary | ICD-10-CM | POA: Diagnosis not present

## 2019-08-09 DIAGNOSIS — I872 Venous insufficiency (chronic) (peripheral): Secondary | ICD-10-CM | POA: Diagnosis not present

## 2019-08-09 DIAGNOSIS — L03115 Cellulitis of right lower limb: Secondary | ICD-10-CM | POA: Diagnosis not present

## 2019-08-09 DIAGNOSIS — L03116 Cellulitis of left lower limb: Secondary | ICD-10-CM | POA: Diagnosis not present

## 2019-08-09 DIAGNOSIS — E11622 Type 2 diabetes mellitus with other skin ulcer: Secondary | ICD-10-CM | POA: Diagnosis not present

## 2019-08-09 DIAGNOSIS — Z7901 Long term (current) use of anticoagulants: Secondary | ICD-10-CM | POA: Diagnosis not present

## 2019-08-10 DIAGNOSIS — E11622 Type 2 diabetes mellitus with other skin ulcer: Secondary | ICD-10-CM | POA: Diagnosis not present

## 2019-08-10 DIAGNOSIS — L97811 Non-pressure chronic ulcer of other part of right lower leg limited to breakdown of skin: Secondary | ICD-10-CM | POA: Diagnosis not present

## 2019-08-10 DIAGNOSIS — L03116 Cellulitis of left lower limb: Secondary | ICD-10-CM | POA: Diagnosis not present

## 2019-08-10 DIAGNOSIS — L03115 Cellulitis of right lower limb: Secondary | ICD-10-CM | POA: Diagnosis not present

## 2019-08-10 DIAGNOSIS — I872 Venous insufficiency (chronic) (peripheral): Secondary | ICD-10-CM | POA: Diagnosis not present

## 2019-08-10 DIAGNOSIS — Z794 Long term (current) use of insulin: Secondary | ICD-10-CM | POA: Diagnosis not present

## 2019-08-10 DIAGNOSIS — Z7901 Long term (current) use of anticoagulants: Secondary | ICD-10-CM | POA: Diagnosis not present

## 2019-08-12 DIAGNOSIS — Z794 Long term (current) use of insulin: Secondary | ICD-10-CM | POA: Diagnosis not present

## 2019-08-12 DIAGNOSIS — I872 Venous insufficiency (chronic) (peripheral): Secondary | ICD-10-CM | POA: Diagnosis not present

## 2019-08-12 DIAGNOSIS — E11622 Type 2 diabetes mellitus with other skin ulcer: Secondary | ICD-10-CM | POA: Diagnosis not present

## 2019-08-12 DIAGNOSIS — I83012 Varicose veins of right lower extremity with ulcer of calf: Secondary | ICD-10-CM | POA: Diagnosis not present

## 2019-08-12 DIAGNOSIS — L97811 Non-pressure chronic ulcer of other part of right lower leg limited to breakdown of skin: Secondary | ICD-10-CM | POA: Diagnosis not present

## 2019-08-12 DIAGNOSIS — L03115 Cellulitis of right lower limb: Secondary | ICD-10-CM | POA: Diagnosis not present

## 2019-08-12 DIAGNOSIS — Z7901 Long term (current) use of anticoagulants: Secondary | ICD-10-CM | POA: Diagnosis not present

## 2019-08-12 DIAGNOSIS — I83022 Varicose veins of left lower extremity with ulcer of calf: Secondary | ICD-10-CM | POA: Diagnosis not present

## 2019-08-12 DIAGNOSIS — L03116 Cellulitis of left lower limb: Secondary | ICD-10-CM | POA: Diagnosis not present

## 2019-08-15 DIAGNOSIS — E11622 Type 2 diabetes mellitus with other skin ulcer: Secondary | ICD-10-CM | POA: Diagnosis not present

## 2019-08-15 DIAGNOSIS — Z7901 Long term (current) use of anticoagulants: Secondary | ICD-10-CM | POA: Diagnosis not present

## 2019-08-15 DIAGNOSIS — L03116 Cellulitis of left lower limb: Secondary | ICD-10-CM | POA: Diagnosis not present

## 2019-08-15 DIAGNOSIS — Z794 Long term (current) use of insulin: Secondary | ICD-10-CM | POA: Diagnosis not present

## 2019-08-15 DIAGNOSIS — L03115 Cellulitis of right lower limb: Secondary | ICD-10-CM | POA: Diagnosis not present

## 2019-08-15 DIAGNOSIS — L97811 Non-pressure chronic ulcer of other part of right lower leg limited to breakdown of skin: Secondary | ICD-10-CM | POA: Diagnosis not present

## 2019-08-15 DIAGNOSIS — I872 Venous insufficiency (chronic) (peripheral): Secondary | ICD-10-CM | POA: Diagnosis not present

## 2019-08-18 DIAGNOSIS — E11622 Type 2 diabetes mellitus with other skin ulcer: Secondary | ICD-10-CM | POA: Diagnosis not present

## 2019-08-18 DIAGNOSIS — Z1211 Encounter for screening for malignant neoplasm of colon: Secondary | ICD-10-CM | POA: Diagnosis not present

## 2019-08-18 DIAGNOSIS — L97811 Non-pressure chronic ulcer of other part of right lower leg limited to breakdown of skin: Secondary | ICD-10-CM | POA: Diagnosis not present

## 2019-08-18 DIAGNOSIS — I82409 Acute embolism and thrombosis of unspecified deep veins of unspecified lower extremity: Secondary | ICD-10-CM | POA: Diagnosis not present

## 2019-08-18 DIAGNOSIS — Z7189 Other specified counseling: Secondary | ICD-10-CM | POA: Diagnosis not present

## 2019-08-18 DIAGNOSIS — Z7901 Long term (current) use of anticoagulants: Secondary | ICD-10-CM | POA: Diagnosis not present

## 2019-08-18 DIAGNOSIS — E0590 Thyrotoxicosis, unspecified without thyrotoxic crisis or storm: Secondary | ICD-10-CM | POA: Diagnosis not present

## 2019-08-18 DIAGNOSIS — Z1339 Encounter for screening examination for other mental health and behavioral disorders: Secondary | ICD-10-CM | POA: Diagnosis not present

## 2019-08-18 DIAGNOSIS — R5383 Other fatigue: Secondary | ICD-10-CM | POA: Diagnosis not present

## 2019-08-18 DIAGNOSIS — L03116 Cellulitis of left lower limb: Secondary | ICD-10-CM | POA: Diagnosis not present

## 2019-08-18 DIAGNOSIS — L03115 Cellulitis of right lower limb: Secondary | ICD-10-CM | POA: Diagnosis not present

## 2019-08-18 DIAGNOSIS — Z0000 Encounter for general adult medical examination without abnormal findings: Secondary | ICD-10-CM | POA: Diagnosis not present

## 2019-08-18 DIAGNOSIS — I872 Venous insufficiency (chronic) (peripheral): Secondary | ICD-10-CM | POA: Diagnosis not present

## 2019-08-18 DIAGNOSIS — Z794 Long term (current) use of insulin: Secondary | ICD-10-CM | POA: Diagnosis not present

## 2019-08-18 DIAGNOSIS — I509 Heart failure, unspecified: Secondary | ICD-10-CM | POA: Diagnosis not present

## 2019-08-18 DIAGNOSIS — Z1331 Encounter for screening for depression: Secondary | ICD-10-CM | POA: Diagnosis not present

## 2019-08-24 DIAGNOSIS — L03115 Cellulitis of right lower limb: Secondary | ICD-10-CM | POA: Diagnosis not present

## 2019-08-24 DIAGNOSIS — Z794 Long term (current) use of insulin: Secondary | ICD-10-CM | POA: Diagnosis not present

## 2019-08-24 DIAGNOSIS — I872 Venous insufficiency (chronic) (peripheral): Secondary | ICD-10-CM | POA: Diagnosis not present

## 2019-08-24 DIAGNOSIS — L03116 Cellulitis of left lower limb: Secondary | ICD-10-CM | POA: Diagnosis not present

## 2019-08-24 DIAGNOSIS — L97811 Non-pressure chronic ulcer of other part of right lower leg limited to breakdown of skin: Secondary | ICD-10-CM | POA: Diagnosis not present

## 2019-08-24 DIAGNOSIS — Z7901 Long term (current) use of anticoagulants: Secondary | ICD-10-CM | POA: Diagnosis not present

## 2019-08-24 DIAGNOSIS — E11622 Type 2 diabetes mellitus with other skin ulcer: Secondary | ICD-10-CM | POA: Diagnosis not present

## 2019-08-26 DIAGNOSIS — Z794 Long term (current) use of insulin: Secondary | ICD-10-CM | POA: Diagnosis not present

## 2019-08-26 DIAGNOSIS — L03116 Cellulitis of left lower limb: Secondary | ICD-10-CM | POA: Diagnosis not present

## 2019-08-26 DIAGNOSIS — L97811 Non-pressure chronic ulcer of other part of right lower leg limited to breakdown of skin: Secondary | ICD-10-CM | POA: Diagnosis not present

## 2019-08-26 DIAGNOSIS — E11622 Type 2 diabetes mellitus with other skin ulcer: Secondary | ICD-10-CM | POA: Diagnosis not present

## 2019-08-26 DIAGNOSIS — Z7901 Long term (current) use of anticoagulants: Secondary | ICD-10-CM | POA: Diagnosis not present

## 2019-08-26 DIAGNOSIS — I872 Venous insufficiency (chronic) (peripheral): Secondary | ICD-10-CM | POA: Diagnosis not present

## 2019-08-26 DIAGNOSIS — L03115 Cellulitis of right lower limb: Secondary | ICD-10-CM | POA: Diagnosis not present

## 2019-08-31 DIAGNOSIS — Z794 Long term (current) use of insulin: Secondary | ICD-10-CM | POA: Diagnosis not present

## 2019-08-31 DIAGNOSIS — L97811 Non-pressure chronic ulcer of other part of right lower leg limited to breakdown of skin: Secondary | ICD-10-CM | POA: Diagnosis not present

## 2019-08-31 DIAGNOSIS — L03116 Cellulitis of left lower limb: Secondary | ICD-10-CM | POA: Diagnosis not present

## 2019-08-31 DIAGNOSIS — Z7901 Long term (current) use of anticoagulants: Secondary | ICD-10-CM | POA: Diagnosis not present

## 2019-08-31 DIAGNOSIS — I872 Venous insufficiency (chronic) (peripheral): Secondary | ICD-10-CM | POA: Diagnosis not present

## 2019-08-31 DIAGNOSIS — E11622 Type 2 diabetes mellitus with other skin ulcer: Secondary | ICD-10-CM | POA: Diagnosis not present

## 2019-08-31 DIAGNOSIS — L03115 Cellulitis of right lower limb: Secondary | ICD-10-CM | POA: Diagnosis not present

## 2019-09-01 DIAGNOSIS — I872 Venous insufficiency (chronic) (peripheral): Secondary | ICD-10-CM | POA: Diagnosis not present

## 2019-09-01 DIAGNOSIS — L03115 Cellulitis of right lower limb: Secondary | ICD-10-CM | POA: Diagnosis not present

## 2019-09-01 DIAGNOSIS — L03116 Cellulitis of left lower limb: Secondary | ICD-10-CM | POA: Diagnosis not present

## 2019-09-01 DIAGNOSIS — Z794 Long term (current) use of insulin: Secondary | ICD-10-CM | POA: Diagnosis not present

## 2019-09-01 DIAGNOSIS — L97811 Non-pressure chronic ulcer of other part of right lower leg limited to breakdown of skin: Secondary | ICD-10-CM | POA: Diagnosis not present

## 2019-09-01 DIAGNOSIS — Z7901 Long term (current) use of anticoagulants: Secondary | ICD-10-CM | POA: Diagnosis not present

## 2019-09-01 DIAGNOSIS — E11622 Type 2 diabetes mellitus with other skin ulcer: Secondary | ICD-10-CM | POA: Diagnosis not present

## 2019-09-02 DIAGNOSIS — Z7901 Long term (current) use of anticoagulants: Secondary | ICD-10-CM | POA: Diagnosis not present

## 2019-09-02 DIAGNOSIS — I872 Venous insufficiency (chronic) (peripheral): Secondary | ICD-10-CM | POA: Diagnosis not present

## 2019-09-02 DIAGNOSIS — L97811 Non-pressure chronic ulcer of other part of right lower leg limited to breakdown of skin: Secondary | ICD-10-CM | POA: Diagnosis not present

## 2019-09-02 DIAGNOSIS — E11622 Type 2 diabetes mellitus with other skin ulcer: Secondary | ICD-10-CM | POA: Diagnosis not present

## 2019-09-02 DIAGNOSIS — L03115 Cellulitis of right lower limb: Secondary | ICD-10-CM | POA: Diagnosis not present

## 2019-09-02 DIAGNOSIS — Z794 Long term (current) use of insulin: Secondary | ICD-10-CM | POA: Diagnosis not present

## 2019-09-02 DIAGNOSIS — L03116 Cellulitis of left lower limb: Secondary | ICD-10-CM | POA: Diagnosis not present

## 2019-09-07 DIAGNOSIS — L97811 Non-pressure chronic ulcer of other part of right lower leg limited to breakdown of skin: Secondary | ICD-10-CM | POA: Diagnosis not present

## 2019-09-07 DIAGNOSIS — Z794 Long term (current) use of insulin: Secondary | ICD-10-CM | POA: Diagnosis not present

## 2019-09-07 DIAGNOSIS — L03115 Cellulitis of right lower limb: Secondary | ICD-10-CM | POA: Diagnosis not present

## 2019-09-07 DIAGNOSIS — L03116 Cellulitis of left lower limb: Secondary | ICD-10-CM | POA: Diagnosis not present

## 2019-09-07 DIAGNOSIS — I872 Venous insufficiency (chronic) (peripheral): Secondary | ICD-10-CM | POA: Diagnosis not present

## 2019-09-07 DIAGNOSIS — Z7901 Long term (current) use of anticoagulants: Secondary | ICD-10-CM | POA: Diagnosis not present

## 2019-09-07 DIAGNOSIS — E11622 Type 2 diabetes mellitus with other skin ulcer: Secondary | ICD-10-CM | POA: Diagnosis not present

## 2019-09-08 ENCOUNTER — Encounter (INDEPENDENT_AMBULATORY_CARE_PROVIDER_SITE_OTHER): Payer: Medicare HMO | Admitting: Ophthalmology

## 2019-09-09 DIAGNOSIS — L97811 Non-pressure chronic ulcer of other part of right lower leg limited to breakdown of skin: Secondary | ICD-10-CM | POA: Diagnosis not present

## 2019-09-09 DIAGNOSIS — L03116 Cellulitis of left lower limb: Secondary | ICD-10-CM | POA: Diagnosis not present

## 2019-09-09 DIAGNOSIS — L03115 Cellulitis of right lower limb: Secondary | ICD-10-CM | POA: Diagnosis not present

## 2019-09-09 DIAGNOSIS — I872 Venous insufficiency (chronic) (peripheral): Secondary | ICD-10-CM | POA: Diagnosis not present

## 2019-09-09 DIAGNOSIS — Z794 Long term (current) use of insulin: Secondary | ICD-10-CM | POA: Diagnosis not present

## 2019-09-09 DIAGNOSIS — E11622 Type 2 diabetes mellitus with other skin ulcer: Secondary | ICD-10-CM | POA: Diagnosis not present

## 2019-09-09 DIAGNOSIS — Z7901 Long term (current) use of anticoagulants: Secondary | ICD-10-CM | POA: Diagnosis not present

## 2019-09-16 DIAGNOSIS — Z7901 Long term (current) use of anticoagulants: Secondary | ICD-10-CM | POA: Diagnosis not present

## 2019-09-16 DIAGNOSIS — Z794 Long term (current) use of insulin: Secondary | ICD-10-CM | POA: Diagnosis not present

## 2019-09-16 DIAGNOSIS — I872 Venous insufficiency (chronic) (peripheral): Secondary | ICD-10-CM | POA: Diagnosis not present

## 2019-09-16 DIAGNOSIS — E11622 Type 2 diabetes mellitus with other skin ulcer: Secondary | ICD-10-CM | POA: Diagnosis not present

## 2019-09-16 DIAGNOSIS — L97811 Non-pressure chronic ulcer of other part of right lower leg limited to breakdown of skin: Secondary | ICD-10-CM | POA: Diagnosis not present

## 2019-09-20 DIAGNOSIS — I872 Venous insufficiency (chronic) (peripheral): Secondary | ICD-10-CM | POA: Diagnosis not present

## 2019-09-20 DIAGNOSIS — Z299 Encounter for prophylactic measures, unspecified: Secondary | ICD-10-CM | POA: Diagnosis not present

## 2019-09-20 DIAGNOSIS — E782 Mixed hyperlipidemia: Secondary | ICD-10-CM | POA: Diagnosis not present

## 2019-09-20 DIAGNOSIS — E1165 Type 2 diabetes mellitus with hyperglycemia: Secondary | ICD-10-CM | POA: Diagnosis not present

## 2019-09-20 DIAGNOSIS — L0390 Cellulitis, unspecified: Secondary | ICD-10-CM | POA: Diagnosis not present

## 2019-09-20 DIAGNOSIS — I82409 Acute embolism and thrombosis of unspecified deep veins of unspecified lower extremity: Secondary | ICD-10-CM | POA: Diagnosis not present

## 2019-09-20 DIAGNOSIS — Z6833 Body mass index (BMI) 33.0-33.9, adult: Secondary | ICD-10-CM | POA: Diagnosis not present

## 2019-09-20 DIAGNOSIS — K439 Ventral hernia without obstruction or gangrene: Secondary | ICD-10-CM | POA: Diagnosis not present

## 2019-09-20 DIAGNOSIS — I10 Essential (primary) hypertension: Secondary | ICD-10-CM | POA: Diagnosis not present

## 2019-09-22 DIAGNOSIS — E11622 Type 2 diabetes mellitus with other skin ulcer: Secondary | ICD-10-CM | POA: Diagnosis not present

## 2019-09-22 DIAGNOSIS — L97811 Non-pressure chronic ulcer of other part of right lower leg limited to breakdown of skin: Secondary | ICD-10-CM | POA: Diagnosis not present

## 2019-09-22 DIAGNOSIS — Z7901 Long term (current) use of anticoagulants: Secondary | ICD-10-CM | POA: Diagnosis not present

## 2019-09-22 DIAGNOSIS — I872 Venous insufficiency (chronic) (peripheral): Secondary | ICD-10-CM | POA: Diagnosis not present

## 2019-09-22 DIAGNOSIS — Z794 Long term (current) use of insulin: Secondary | ICD-10-CM | POA: Diagnosis not present

## 2019-09-23 DIAGNOSIS — L84 Corns and callosities: Secondary | ICD-10-CM | POA: Diagnosis not present

## 2019-09-23 DIAGNOSIS — M79676 Pain in unspecified toe(s): Secondary | ICD-10-CM | POA: Diagnosis not present

## 2019-09-23 DIAGNOSIS — E1142 Type 2 diabetes mellitus with diabetic polyneuropathy: Secondary | ICD-10-CM | POA: Diagnosis not present

## 2019-09-23 DIAGNOSIS — B351 Tinea unguium: Secondary | ICD-10-CM | POA: Diagnosis not present

## 2019-09-30 DIAGNOSIS — L97811 Non-pressure chronic ulcer of other part of right lower leg limited to breakdown of skin: Secondary | ICD-10-CM | POA: Diagnosis not present

## 2019-09-30 DIAGNOSIS — Z794 Long term (current) use of insulin: Secondary | ICD-10-CM | POA: Diagnosis not present

## 2019-09-30 DIAGNOSIS — E11622 Type 2 diabetes mellitus with other skin ulcer: Secondary | ICD-10-CM | POA: Diagnosis not present

## 2019-09-30 DIAGNOSIS — I872 Venous insufficiency (chronic) (peripheral): Secondary | ICD-10-CM | POA: Diagnosis not present

## 2019-09-30 DIAGNOSIS — Z7901 Long term (current) use of anticoagulants: Secondary | ICD-10-CM | POA: Diagnosis not present

## 2019-10-07 DIAGNOSIS — I10 Essential (primary) hypertension: Secondary | ICD-10-CM | POA: Diagnosis not present

## 2019-10-07 DIAGNOSIS — Z299 Encounter for prophylactic measures, unspecified: Secondary | ICD-10-CM | POA: Diagnosis not present

## 2019-10-07 DIAGNOSIS — Z7901 Long term (current) use of anticoagulants: Secondary | ICD-10-CM | POA: Diagnosis not present

## 2019-10-07 DIAGNOSIS — L97811 Non-pressure chronic ulcer of other part of right lower leg limited to breakdown of skin: Secondary | ICD-10-CM | POA: Diagnosis not present

## 2019-10-07 DIAGNOSIS — I872 Venous insufficiency (chronic) (peripheral): Secondary | ICD-10-CM | POA: Diagnosis not present

## 2019-10-07 DIAGNOSIS — Z789 Other specified health status: Secondary | ICD-10-CM | POA: Diagnosis not present

## 2019-10-07 DIAGNOSIS — I509 Heart failure, unspecified: Secondary | ICD-10-CM | POA: Diagnosis not present

## 2019-10-07 DIAGNOSIS — E1165 Type 2 diabetes mellitus with hyperglycemia: Secondary | ICD-10-CM | POA: Diagnosis not present

## 2019-10-07 DIAGNOSIS — E11622 Type 2 diabetes mellitus with other skin ulcer: Secondary | ICD-10-CM | POA: Diagnosis not present

## 2019-10-07 DIAGNOSIS — Z6833 Body mass index (BMI) 33.0-33.9, adult: Secondary | ICD-10-CM | POA: Diagnosis not present

## 2019-10-07 DIAGNOSIS — Z794 Long term (current) use of insulin: Secondary | ICD-10-CM | POA: Diagnosis not present

## 2019-10-07 DIAGNOSIS — E1159 Type 2 diabetes mellitus with other circulatory complications: Secondary | ICD-10-CM | POA: Diagnosis not present

## 2019-10-07 DIAGNOSIS — Z86718 Personal history of other venous thrombosis and embolism: Secondary | ICD-10-CM | POA: Diagnosis not present

## 2019-10-11 ENCOUNTER — Encounter (INDEPENDENT_AMBULATORY_CARE_PROVIDER_SITE_OTHER): Payer: Medicare HMO | Admitting: Ophthalmology

## 2019-10-11 DIAGNOSIS — I10 Essential (primary) hypertension: Secondary | ICD-10-CM

## 2019-10-11 DIAGNOSIS — E113313 Type 2 diabetes mellitus with moderate nonproliferative diabetic retinopathy with macular edema, bilateral: Secondary | ICD-10-CM | POA: Diagnosis not present

## 2019-10-11 DIAGNOSIS — H35033 Hypertensive retinopathy, bilateral: Secondary | ICD-10-CM

## 2019-10-11 DIAGNOSIS — E11311 Type 2 diabetes mellitus with unspecified diabetic retinopathy with macular edema: Secondary | ICD-10-CM

## 2019-10-11 DIAGNOSIS — H43813 Vitreous degeneration, bilateral: Secondary | ICD-10-CM | POA: Diagnosis not present

## 2019-10-13 DIAGNOSIS — E11622 Type 2 diabetes mellitus with other skin ulcer: Secondary | ICD-10-CM | POA: Diagnosis not present

## 2019-10-13 DIAGNOSIS — I872 Venous insufficiency (chronic) (peripheral): Secondary | ICD-10-CM | POA: Diagnosis not present

## 2019-10-13 DIAGNOSIS — L97811 Non-pressure chronic ulcer of other part of right lower leg limited to breakdown of skin: Secondary | ICD-10-CM | POA: Diagnosis not present

## 2019-10-13 DIAGNOSIS — Z7901 Long term (current) use of anticoagulants: Secondary | ICD-10-CM | POA: Diagnosis not present

## 2019-10-13 DIAGNOSIS — Z794 Long term (current) use of insulin: Secondary | ICD-10-CM | POA: Diagnosis not present

## 2019-10-14 DIAGNOSIS — Z794 Long term (current) use of insulin: Secondary | ICD-10-CM | POA: Diagnosis not present

## 2019-10-14 DIAGNOSIS — Z7901 Long term (current) use of anticoagulants: Secondary | ICD-10-CM | POA: Diagnosis not present

## 2019-10-14 DIAGNOSIS — I872 Venous insufficiency (chronic) (peripheral): Secondary | ICD-10-CM | POA: Diagnosis not present

## 2019-10-14 DIAGNOSIS — L97811 Non-pressure chronic ulcer of other part of right lower leg limited to breakdown of skin: Secondary | ICD-10-CM | POA: Diagnosis not present

## 2019-10-14 DIAGNOSIS — E11622 Type 2 diabetes mellitus with other skin ulcer: Secondary | ICD-10-CM | POA: Diagnosis not present

## 2019-10-21 DIAGNOSIS — E11622 Type 2 diabetes mellitus with other skin ulcer: Secondary | ICD-10-CM | POA: Diagnosis not present

## 2019-10-21 DIAGNOSIS — L97811 Non-pressure chronic ulcer of other part of right lower leg limited to breakdown of skin: Secondary | ICD-10-CM | POA: Diagnosis not present

## 2019-10-21 DIAGNOSIS — Z7901 Long term (current) use of anticoagulants: Secondary | ICD-10-CM | POA: Diagnosis not present

## 2019-10-21 DIAGNOSIS — Z794 Long term (current) use of insulin: Secondary | ICD-10-CM | POA: Diagnosis not present

## 2019-10-21 DIAGNOSIS — I872 Venous insufficiency (chronic) (peripheral): Secondary | ICD-10-CM | POA: Diagnosis not present

## 2019-10-25 ENCOUNTER — Encounter (INDEPENDENT_AMBULATORY_CARE_PROVIDER_SITE_OTHER): Payer: Medicare HMO | Admitting: Ophthalmology

## 2019-11-04 DIAGNOSIS — B351 Tinea unguium: Secondary | ICD-10-CM | POA: Diagnosis not present

## 2019-11-04 DIAGNOSIS — M79676 Pain in unspecified toe(s): Secondary | ICD-10-CM | POA: Diagnosis not present

## 2019-11-04 DIAGNOSIS — E1151 Type 2 diabetes mellitus with diabetic peripheral angiopathy without gangrene: Secondary | ICD-10-CM | POA: Diagnosis not present

## 2019-11-04 DIAGNOSIS — L84 Corns and callosities: Secondary | ICD-10-CM | POA: Diagnosis not present

## 2019-11-18 DIAGNOSIS — I10 Essential (primary) hypertension: Secondary | ICD-10-CM | POA: Diagnosis not present

## 2019-11-18 DIAGNOSIS — I82409 Acute embolism and thrombosis of unspecified deep veins of unspecified lower extremity: Secondary | ICD-10-CM | POA: Diagnosis not present

## 2019-11-18 DIAGNOSIS — Z789 Other specified health status: Secondary | ICD-10-CM | POA: Diagnosis not present

## 2019-11-18 DIAGNOSIS — E1142 Type 2 diabetes mellitus with diabetic polyneuropathy: Secondary | ICD-10-CM | POA: Diagnosis not present

## 2019-11-18 DIAGNOSIS — Z6833 Body mass index (BMI) 33.0-33.9, adult: Secondary | ICD-10-CM | POA: Diagnosis not present

## 2019-11-18 DIAGNOSIS — I509 Heart failure, unspecified: Secondary | ICD-10-CM | POA: Diagnosis not present

## 2019-11-18 DIAGNOSIS — E1165 Type 2 diabetes mellitus with hyperglycemia: Secondary | ICD-10-CM | POA: Diagnosis not present

## 2019-11-18 DIAGNOSIS — I739 Peripheral vascular disease, unspecified: Secondary | ICD-10-CM | POA: Diagnosis not present

## 2019-11-18 DIAGNOSIS — Z299 Encounter for prophylactic measures, unspecified: Secondary | ICD-10-CM | POA: Diagnosis not present

## 2019-11-18 DIAGNOSIS — E1159 Type 2 diabetes mellitus with other circulatory complications: Secondary | ICD-10-CM | POA: Diagnosis not present

## 2019-12-07 DIAGNOSIS — Z111 Encounter for screening for respiratory tuberculosis: Secondary | ICD-10-CM | POA: Diagnosis not present

## 2019-12-15 ENCOUNTER — Observation Stay (HOSPITAL_COMMUNITY)
Admission: EM | Admit: 2019-12-15 | Discharge: 2019-12-17 | Disposition: A | Discharge status: Home / Self Care | Payer: Medicare HMO | Attending: Cardiovascular Disease | Admitting: Cardiovascular Disease

## 2019-12-15 ENCOUNTER — Encounter (INDEPENDENT_AMBULATORY_CARE_PROVIDER_SITE_OTHER): Payer: Medicare HMO | Admitting: Ophthalmology

## 2019-12-15 ENCOUNTER — Encounter (INDEPENDENT_AMBULATORY_CARE_PROVIDER_SITE_OTHER): Payer: Self-pay

## 2019-12-15 ENCOUNTER — Encounter (HOSPITAL_COMMUNITY): Payer: Self-pay | Admitting: Emergency Medicine

## 2019-12-15 ENCOUNTER — Other Ambulatory Visit: Payer: Self-pay

## 2019-12-15 ENCOUNTER — Observation Stay (HOSPITAL_BASED_OUTPATIENT_CLINIC_OR_DEPARTMENT_OTHER): Payer: Medicare HMO

## 2019-12-15 ENCOUNTER — Emergency Department (HOSPITAL_COMMUNITY): Payer: Medicare HMO

## 2019-12-15 DIAGNOSIS — E785 Hyperlipidemia, unspecified: Secondary | ICD-10-CM | POA: Diagnosis present

## 2019-12-15 DIAGNOSIS — Z20822 Contact with and (suspected) exposure to covid-19: Secondary | ICD-10-CM | POA: Diagnosis not present

## 2019-12-15 DIAGNOSIS — R072 Precordial pain: Secondary | ICD-10-CM | POA: Diagnosis not present

## 2019-12-15 DIAGNOSIS — Z955 Presence of coronary angioplasty implant and graft: Secondary | ICD-10-CM | POA: Diagnosis not present

## 2019-12-15 DIAGNOSIS — Z794 Long term (current) use of insulin: Secondary | ICD-10-CM | POA: Diagnosis not present

## 2019-12-15 DIAGNOSIS — I872 Venous insufficiency (chronic) (peripheral): Secondary | ICD-10-CM

## 2019-12-15 DIAGNOSIS — I2510 Atherosclerotic heart disease of native coronary artery without angina pectoris: Secondary | ICD-10-CM | POA: Insufficient documentation

## 2019-12-15 DIAGNOSIS — I480 Paroxysmal atrial fibrillation: Secondary | ICD-10-CM | POA: Insufficient documentation

## 2019-12-15 DIAGNOSIS — R0902 Hypoxemia: Secondary | ICD-10-CM | POA: Diagnosis not present

## 2019-12-15 DIAGNOSIS — R0602 Shortness of breath: Secondary | ICD-10-CM | POA: Diagnosis not present

## 2019-12-15 DIAGNOSIS — R0789 Other chest pain: Secondary | ICD-10-CM | POA: Diagnosis not present

## 2019-12-15 DIAGNOSIS — E118 Type 2 diabetes mellitus with unspecified complications: Secondary | ICD-10-CM

## 2019-12-15 DIAGNOSIS — I509 Heart failure, unspecified: Secondary | ICD-10-CM | POA: Insufficient documentation

## 2019-12-15 DIAGNOSIS — I361 Nonrheumatic tricuspid (valve) insufficiency: Secondary | ICD-10-CM | POA: Diagnosis not present

## 2019-12-15 DIAGNOSIS — R079 Chest pain, unspecified: Principal | ICD-10-CM | POA: Insufficient documentation

## 2019-12-15 DIAGNOSIS — E119 Type 2 diabetes mellitus without complications: Secondary | ICD-10-CM | POA: Diagnosis not present

## 2019-12-15 DIAGNOSIS — Z86718 Personal history of other venous thrombosis and embolism: Secondary | ICD-10-CM

## 2019-12-15 DIAGNOSIS — I10 Essential (primary) hypertension: Secondary | ICD-10-CM | POA: Diagnosis present

## 2019-12-15 DIAGNOSIS — I4891 Unspecified atrial fibrillation: Secondary | ICD-10-CM

## 2019-12-15 HISTORY — DX: Unspecified hearing loss, unspecified ear: H91.90

## 2019-12-15 LAB — COMPREHENSIVE METABOLIC PANEL
ALT: 16 U/L (ref 0–44)
AST: 24 U/L (ref 15–41)
Albumin: 3.2 g/dL — ABNORMAL LOW (ref 3.5–5.0)
Alkaline Phosphatase: 56 U/L (ref 38–126)
Anion gap: 13 (ref 5–15)
BUN: 16 mg/dL (ref 8–23)
CO2: 28 mmol/L (ref 22–32)
Calcium: 9.3 mg/dL (ref 8.9–10.3)
Chloride: 103 mmol/L (ref 98–111)
Creatinine, Ser: 1.03 mg/dL — ABNORMAL HIGH (ref 0.44–1.00)
GFR calc Af Amer: 59 mL/min — ABNORMAL LOW (ref >60)
GFR calc non Af Amer: 51 mL/min — ABNORMAL LOW (ref >60)
Glucose, Bld: 174 mg/dL — ABNORMAL HIGH (ref 70–99)
Potassium: 3.1 mmol/L — ABNORMAL LOW (ref 3.5–5.1)
Sodium: 144 mmol/L (ref 135–145)
Total Bilirubin: 0.7 mg/dL (ref 0.3–1.2)
Total Protein: 6.3 g/dL — ABNORMAL LOW (ref 6.5–8.1)

## 2019-12-15 LAB — CBC WITH DIFFERENTIAL/PLATELET
Abs Immature Granulocytes: 0.02 10*3/uL (ref 0.00–0.07)
Basophils Absolute: 0 10*3/uL (ref 0.0–0.1)
Basophils Relative: 0 %
Eosinophils Absolute: 0.2 10*3/uL (ref 0.0–0.5)
Eosinophils Relative: 2 %
HCT: 41.3 % (ref 36.0–46.0)
Hemoglobin: 13.6 g/dL (ref 12.0–15.0)
Immature Granulocytes: 0 %
Lymphocytes Relative: 18 %
Lymphs Abs: 1.2 10*3/uL (ref 0.7–4.0)
MCH: 30.1 pg (ref 26.0–34.0)
MCHC: 32.9 g/dL (ref 30.0–36.0)
MCV: 91.4 fL (ref 80.0–100.0)
Monocytes Absolute: 0.6 10*3/uL (ref 0.1–1.0)
Monocytes Relative: 9 %
Neutro Abs: 4.6 10*3/uL (ref 1.7–7.7)
Neutrophils Relative %: 71 %
Platelets: 145 10*3/uL — ABNORMAL LOW (ref 150–400)
RBC: 4.52 MIL/uL (ref 3.87–5.11)
RDW: 13.6 % (ref 11.5–15.5)
WBC: 6.5 10*3/uL (ref 4.0–10.5)
nRBC: 0 % (ref 0.0–0.2)

## 2019-12-15 LAB — GLUCOSE, CAPILLARY
Glucose-Capillary: 216 mg/dL — ABNORMAL HIGH (ref 70–99)
Glucose-Capillary: 212 mg/dL — ABNORMAL HIGH (ref 70–99)

## 2019-12-15 LAB — ECHOCARDIOGRAM COMPLETE
Height: 67 in
Weight: 2688 oz

## 2019-12-15 LAB — CBG MONITORING, ED
Glucose-Capillary: 164 mg/dL — ABNORMAL HIGH (ref 70–99)
Glucose-Capillary: 209 mg/dL — ABNORMAL HIGH (ref 70–99)

## 2019-12-15 LAB — URINALYSIS, ROUTINE W REFLEX MICROSCOPIC
Bilirubin Urine: NEGATIVE
Glucose, UA: NEGATIVE mg/dL
Ketones, ur: NEGATIVE mg/dL
Nitrite: NEGATIVE
Protein, ur: 30 mg/dL — AB
Specific Gravity, Urine: 1.009 (ref 1.005–1.030)
WBC, UA: >50 WBC/hpf — ABNORMAL HIGH (ref 0–5)
pH: 5 (ref 5.0–8.0)

## 2019-12-15 LAB — HEMOGLOBIN A1C
Hgb A1c MFr Bld: 8.4 % — ABNORMAL HIGH (ref 4.8–5.6)
Mean Plasma Glucose: 194.38 mg/dL

## 2019-12-15 LAB — SARS CORONAVIRUS 2 (TAT 6-24 HRS): SARS Coronavirus 2: NEGATIVE

## 2019-12-15 LAB — MAGNESIUM: Magnesium: 1.3 mg/dL — ABNORMAL LOW (ref 1.7–2.4)

## 2019-12-15 LAB — TROPONIN I (HIGH SENSITIVITY)
Troponin I (High Sensitivity): 8 ng/L (ref <18)
Troponin I (High Sensitivity): 9 ng/L (ref <18)

## 2019-12-15 LAB — T4, FREE: Free T4: 1.11 ng/dL (ref 0.61–1.12)

## 2019-12-15 LAB — TSH: TSH: 0.136 u[IU]/mL — ABNORMAL LOW (ref 0.350–4.500)

## 2019-12-15 LAB — PROTIME-INR
INR: 3.9 — ABNORMAL HIGH (ref 0.8–1.2)
Prothrombin Time: 38.1 seconds — ABNORMAL HIGH (ref 11.4–15.2)

## 2019-12-15 MED ORDER — ACETAMINOPHEN 325 MG PO TABS
650.0000 mg | ORAL_TABLET | ORAL | Status: DC | PRN
  Administered 2019-12-16: 650 mg via ORAL
  Filled 2019-12-15: qty 2

## 2019-12-15 MED ORDER — SODIUM CHLORIDE 0.9% FLUSH: 3.0000 mL | INTRAVENOUS | Status: DC | PRN

## 2019-12-15 MED ORDER — ASPIRIN 81 MG PO CHEW
243.0000 mg | CHEWABLE_TABLET | Freq: Once | ORAL | Status: AC
  Administered 2019-12-15: 243 mg via ORAL
  Filled 2019-12-15: qty 3

## 2019-12-15 MED ORDER — ONDANSETRON HCL 4 MG/2ML IJ SOLN: 4.0000 mg | Freq: Four times a day (QID) | INTRAMUSCULAR | Status: DC | PRN

## 2019-12-15 MED ORDER — TORSEMIDE 20 MG PO TABS
20.0000 mg | ORAL_TABLET | Freq: Two times a day (BID) | ORAL | Status: DC
  Administered 2019-12-15 – 2019-12-17 (×4): 20 mg via ORAL
  Filled 2019-12-15 (×4): qty 1

## 2019-12-15 MED ORDER — SODIUM CHLORIDE 0.9% FLUSH
3.0000 mL | Freq: Two times a day (BID) | INTRAVENOUS | Status: DC
  Administered 2019-12-15 – 2019-12-17 (×3): 3 mL via INTRAVENOUS

## 2019-12-15 MED ORDER — ZOLPIDEM TARTRATE 5 MG PO TABS: 5.0000 mg | ORAL_TABLET | Freq: Every evening | ORAL | Status: DC | PRN

## 2019-12-15 MED ORDER — SODIUM CHLORIDE 0.9 % IV SOLN: 250.0000 mL | INTRAVENOUS | Status: DC | PRN

## 2019-12-15 MED ORDER — DILTIAZEM HCL 25 MG/5ML IV SOLN: 10.0000 mg | Freq: Once | INTRAVENOUS | Status: DC

## 2019-12-15 MED ORDER — INSULIN ASPART 100 UNIT/ML ~~LOC~~ SOLN
0.0000 [IU] | Freq: Every day | SUBCUTANEOUS | Status: DC
  Administered 2019-12-15 – 2019-12-16 (×2): 2 [IU] via SUBCUTANEOUS

## 2019-12-15 MED ORDER — FESOTERODINE FUMARATE ER 8 MG PO TB24
8.0000 mg | ORAL_TABLET | Freq: Every day | ORAL | Status: DC
  Administered 2019-12-15 – 2019-12-17 (×3): 8 mg via ORAL
  Filled 2019-12-15 (×3): qty 1

## 2019-12-15 MED ORDER — ACETAMINOPHEN 325 MG PO TABS
650.0000 mg | ORAL_TABLET | Freq: Once | ORAL | Status: AC
  Administered 2019-12-15: 650 mg via ORAL
  Filled 2019-12-15: qty 2

## 2019-12-15 MED ORDER — PANTOPRAZOLE SODIUM 40 MG PO TBEC
40.0000 mg | DELAYED_RELEASE_TABLET | Freq: Every day | ORAL | Status: DC
  Administered 2019-12-15 – 2019-12-17 (×3): 40 mg via ORAL
  Filled 2019-12-15 (×3): qty 1

## 2019-12-15 MED ORDER — ISOSORBIDE DINITRATE 10 MG PO TABS
30.0000 mg | ORAL_TABLET | Freq: Two times a day (BID) | ORAL | Status: DC
  Administered 2019-12-15 – 2019-12-17 (×4): 30 mg via ORAL
  Filled 2019-12-15: qty 3
  Filled 2019-12-15: qty 1
  Filled 2019-12-15 (×3): qty 3

## 2019-12-15 MED ORDER — INSULIN ASPART 100 UNIT/ML ~~LOC~~ SOLN
0.0000 [IU] | Freq: Three times a day (TID) | SUBCUTANEOUS | Status: DC
  Administered 2019-12-15: 5 [IU] via SUBCUTANEOUS
  Administered 2019-12-16: 8 [IU] via SUBCUTANEOUS
  Administered 2019-12-16 (×2): 3 [IU] via SUBCUTANEOUS
  Administered 2019-12-17 (×2): 8 [IU] via SUBCUTANEOUS

## 2019-12-15 MED ORDER — ALPRAZOLAM 0.25 MG PO TABS: 0.2500 mg | ORAL_TABLET | Freq: Two times a day (BID) | ORAL | Status: DC | PRN

## 2019-12-15 MED ORDER — METOPROLOL TARTRATE 5 MG/5ML IV SOLN
10.0000 mg | Freq: Once | INTRAVENOUS | Status: AC
  Administered 2019-12-15: 10 mg via INTRAVENOUS
  Filled 2019-12-15: qty 10

## 2019-12-15 MED ORDER — ROSUVASTATIN CALCIUM 5 MG PO TABS
10.0000 mg | ORAL_TABLET | Freq: Every day | ORAL | Status: DC
  Administered 2019-12-15 – 2019-12-16 (×2): 10 mg via ORAL
  Filled 2019-12-15 (×2): qty 2

## 2019-12-15 MED ORDER — TRAMADOL HCL 50 MG PO TABS
50.0000 mg | ORAL_TABLET | Freq: Two times a day (BID) | ORAL | Status: DC | PRN
  Administered 2019-12-16: 50 mg via ORAL
  Filled 2019-12-15: qty 1

## 2019-12-15 MED ORDER — METOPROLOL TARTRATE 50 MG PO TABS
50.0000 mg | ORAL_TABLET | Freq: Two times a day (BID) | ORAL | Status: DC
  Administered 2019-12-15 – 2019-12-17 (×4): 50 mg via ORAL
  Filled 2019-12-15 (×4): qty 1

## 2019-12-15 NOTE — ED Notes (Signed)
Spoke to Therapist, sports at [facility] and gave update on pt.

## 2019-12-15 NOTE — ED Notes (Signed)
RN attempted report 

## 2019-12-15 NOTE — ED Notes (Signed)
ECHO at bedside.

## 2019-12-15 NOTE — ED Triage Notes (Signed)
Pt arrives via EMS. Pt was at Drs office having procedure on eyes. Eyes were dilated and pt began having chest pain with SOB. Pt has cardiac history. Pt lives at [place], [place]

## 2019-12-15 NOTE — ED Notes (Signed)
Got patient undress on the monitor did ekg shown to Dr Darl Householder patient is resting with call bell in reach and nurse at bedside

## 2019-12-15 NOTE — ED Notes (Signed)
Order lunch tray

## 2019-12-15 NOTE — Progress Notes (Signed)
  Echocardiogram 2D Echocardiogram has been performed.  Stephanie Orozco 12/15/2019, 5:43 PM

## 2019-12-15 NOTE — ED Provider Notes (Signed)
[facility] EMERGENCY DEPARTMENT Provider Note   CSN: [id number] Arrival date & time: 12/15/19  1012     History Chief Complaint  Patient presents with  . Chest Pain    Stephanie Orozco is a 84 y.o. female.  HPI  HPI: A 84 year old patient with a history of treated diabetes, hypertension and hypercholesterolemia presents for evaluation of chest pain. Initial onset of pain was approximately 3-6 hours ago. The patient's chest pain is described as heaviness/pressure/tightness and is not worse with exertion. The patient's chest pain is middle- or left-sided, is not well-localized, is not sharp and does not radiate to the arms/jaw/neck. The patient does not complain of nausea and denies diaphoresis. The patient has no history of stroke, has no history of peripheral artery disease, has not smoked in the past 90 days, has no relevant family history of coronary artery disease (first degree relative at less than age 27) and does not have an elevated BMI (>=30).    Stephanie Orozco is a 84 y.o. female, with a history of CAD, anticoagulation, 5 coronary stents, presenting to the ED with chest pain beginning around 9 a.m. this morning. She was at her ophthalmologist office, they dilated her eyes, she began to feel chest pain. Left chest, feels like pressure, initially 9/10, nonradiating. Has improved to 3/10 currently.  Accompanied by some shortness of breath, but this has resolved. She does not know if she has a history of A. fib or other arrhythmia, though she is anticoagulated on coumadin and lopressor. She was given her morning medications at her assisted living facility ([facility] in [place]) this morning.  Denies fever/chills, cough, abdominal pain, syncope, dizziness, diaphoresis, N/V/D, recent illness, urinary symptoms, acute lower extremity edema/pain, or any other complaints.  Cardiologist: Dr. Daiva Huge with [facility]  Past Medical History:  Diagnosis Date  .  Diabetes mellitus, type II ([place])   . Heart attack ([place])   . Heart failure ([place])   . Hyperlipidemia   . Hypertension     Patient Active Problem List   Diagnosis Date Noted  . Atrial fibrillation with rapid ventricular response ([place]) 12/15/2019  . Varicose veins of bilateral lower extremities with other complications [id number]  . Subclinical hyperthyroidism 01/03/2016  . DM type 2 causing vascular disease ([place]) 11/22/2015  . Essential hypertension, benign 11/22/2015  . Hyperlipidemia 11/22/2015    Past Surgical History:  Procedure Laterality Date  . ABDOMINAL HYSTERECTOMY    . CORONARY ANGIOPLASTY WITH STENT PLACEMENT    . CYSTOSCOPY       OB History   No obstetric history on file.     Family History  Problem Relation Age of Onset  . Heart attack Mother   . Hypertension Father   . Hyperlipidemia Father   . Hyperlipidemia Sister     Social History   Tobacco Use  . Smoking status: Never Smoker  . Smokeless tobacco: Never Used  Substance Use Topics  . Alcohol use: No    Alcohol/week: 0.0 standard drinks  . Drug use: No    Home Medications Prior to Admission medications   Medication Sig Start Date End Date Taking? Authorizing Provider  fesoterodine (TOVIAZ) 8 MG TB24 tablet Take 8 mg by mouth daily.   Yes [provider]  insulin aspart (NOVOLOG) 100 UNIT/ML FlexPen Inject 1-10 Units into the skin 4 (four) times daily -  before meals and at bedtime. Per sliding scale   Yes [provider]  Insulin Glargine (TOUJEO SOLOSTAR)  300 UNIT/ML SOPN Inject 30 Units into the skin daily.    Yes [provider]  isosorbide dinitrate (ISORDIL) 30 MG tablet Take 30 mg by mouth daily.   Yes [provider]  metFORMIN (GLUCOPHAGE) 500 MG tablet Take 500 mg by mouth 3 (three) times daily.    Yes [provider]  omeprazole (PRILOSEC) 20 MG capsule Take 20 mg by mouth daily.   Yes [provider]  rosuvastatin (CRESTOR) 10 MG  tablet Take 10 mg by mouth at bedtime.   Yes [provider]  simvastatin (ZOCOR) 40 MG tablet Take 40 mg by mouth at bedtime. 12/09/19  Yes [provider]  torsemide (DEMADEX) 20 MG tablet Take 20 mg by mouth 2 (two) times daily.    Yes [provider]  warfarin (COUMADIN) 7.5 MG tablet Take 7.5 mg by mouth daily.   Yes [provider]  NOVOLOG FLEXPEN 100 UNIT/ML FlexPen INJECT 8-14 UNITS INTO THE SKIN 3 (THREE) TIMES DAILY WITH MEALS. Patient not taking: Reported on 12/15/2019 04/02/17   Cassandria Anger, MD    Allergies    Lasix [furosemide] and Other  Review of Systems   Review of Systems  Constitutional: Negative for chills, diaphoresis and fever.  Respiratory: Positive for shortness of breath (resolved). Negative for cough.   Cardiovascular: Positive for chest pain. Negative for leg swelling.  Gastrointestinal: Negative for abdominal pain, diarrhea, nausea and vomiting.  Genitourinary: Negative for dysuria, frequency and hematuria.  Musculoskeletal: Negative for neck pain.  Neurological: Negative for dizziness, syncope, weakness and light-headedness.  All other systems reviewed and are negative.   Physical Exam Updated Vital Signs BP (!) 152/96 (BP Location: Right Arm)   Pulse (!) 113   Temp (!) 97.5 F (36.4 C) (Oral)   Resp 17   Ht 5\' 7"  (1.702 m)   Wt 76.2 kg   SpO2 96%   BMI 26.31 kg/m   Physical Exam Vitals and nursing note reviewed.  Constitutional:      General: She is not in acute distress.    Appearance: She is well-developed. She is not diaphoretic.  HENT:     Head: Normocephalic and atraumatic.     Mouth/Throat:     Mouth: Mucous membranes are moist.     Pharynx: Oropharynx is clear.  Eyes:     Conjunctiva/sclera: Conjunctivae normal.  Cardiovascular:     Rate and Rhythm: Tachycardia present. Rhythm irregularly irregular.     Pulses: Normal pulses.          Radial pulses are 2+ on the right side and 2+ on the  left side.       Posterior tibial pulses are 2+ on the right side and 2+ on the left side.     Heart sounds: Normal heart sounds.     Comments: Tactile temperature in the extremities appropriate and equal bilaterally. States she chronically has lower extremity edema. Pulmonary:     Effort: Pulmonary effort is normal. No respiratory distress.     Breath sounds: Normal breath sounds.  Abdominal:     Palpations: Abdomen is soft.     Tenderness: There is no abdominal tenderness. There is no guarding.  Musculoskeletal:     Cervical back: Neck supple.     Right lower leg: Edema present.     Left lower leg: Edema present.  Lymphadenopathy:     Cervical: No cervical adenopathy.  Skin:    General: Skin is warm and dry.  Neurological:  Mental Status: She is alert.  Psychiatric:        Mood and Affect: Mood and affect normal.        Speech: Speech normal.        Behavior: Behavior normal.     ED Results / Procedures / Treatments   Labs (all labs ordered are listed, but only abnormal results are displayed) Labs Reviewed  COMPREHENSIVE METABOLIC PANEL - Abnormal; Notable for the following components:      Result Value   Potassium 3.1 (*)    Glucose, Bld 174 (*)    Creatinine, Ser 1.03 (*)    Total Protein 6.3 (*)    Albumin 3.2 (*)    GFR calc non Af Amer 51 (*)    GFR calc Af Amer 59 (*)    All other components within normal limits  PROTIME-INR - Abnormal; Notable for the following components:   Prothrombin Time 38.1 (*)    INR 3.9 (*)    All other components within normal limits  CBC WITH DIFFERENTIAL/PLATELET - Abnormal; Notable for the following components:   Platelets 145 (*)    All other components within normal limits  URINALYSIS, ROUTINE W REFLEX MICROSCOPIC - Abnormal; Notable for the following components:   Hgb urine dipstick SMALL (*)    Protein, ur 30 (*)    Leukocytes,Ua LARGE (*)    WBC, UA >50 (*)    Bacteria, UA RARE (*)    All other components within  normal limits  CBG MONITORING, ED - Abnormal; Notable for the following components:   Glucose-Capillary 164 (*)    All other components within normal limits  SARS CORONAVIRUS 2 (TAT 6-24 HRS)  URINE CULTURE  TROPONIN I (HIGH SENSITIVITY)  TROPONIN I (HIGH SENSITIVITY)    EKG EKG Interpretation  Date/Time:  Wednesday December 15 2019 10:12:38 EDT Ventricular Rate:  110 PR Interval:    QRS Duration: 93 QT Interval:  342 QTC Calculation: 463 R Axis:   5 Text Interpretation: Atrial fibrillation Borderline repolarization abnormality No previous ECGs available Confirmed by Wandra Arthurs [phone]) on 12/15/2019 10:15:49 AM   Radiology DG Chest Portable 1 View  Result Date: 12/15/2019 CLINICAL DATA:  Chest pain, shortness of breath EXAM: PORTABLE CHEST 1 VIEW COMPARISON:  08/14/2018 FINDINGS: The heart size and mediastinal contours are stable. Atherosclerotic calcification of the aortic knob. Chronic increased interstitial markings throughout both lungs. No superimposed airspace consolidation. No pleural effusion or pneumothorax. IMPRESSION: Chronic bronchitic type lung changes. No superimposed airspace opacity. Electronically Signed   By: Davina Poke D.O.   On: 12/15/2019 10:45    Procedures .Critical Care Performed by: Lorayne Bender, PA-C Authorized by: Lorayne Bender, PA-C   Critical care provider statement:    Critical care time (minutes):  35   Critical care time was exclusive of:  Separately billable procedures and treating other patients   Critical care was necessary to treat or prevent imminent or life-threatening deterioration of the following conditions: Afib RVR requiring medication intervention.   Critical care was time spent personally by me on the following activities:  Ordering and performing treatments and interventions, ordering and review of laboratory studies, ordering and review of radiographic studies, pulse oximetry, re-evaluation of patient's condition, review of old  charts, obtaining history from patient or surrogate, discussions with consultants, development of treatment plan with patient or surrogate, evaluation of patient's response to treatment and examination of patient   I assumed direction of critical care for this patient from  another provider in my specialty: no     (including critical care time)  Medications Ordered in ED Medications  metoprolol tartrate (LOPRESSOR) injection 10 mg (10 mg Intravenous Given 12/15/19 1051)  aspirin chewable tablet 243 mg (243 mg Oral Given 12/15/19 1100)  acetaminophen (TYLENOL) tablet 650 mg (650 mg Oral Given 12/15/19 1544)    ED Course  I have reviewed the triage vital signs and the nursing notes.  Pertinent labs & imaging results that were available during my care of the patient were reviewed by me and considered in my medical decision making (see chart for details).  Clinical Course as of Dec 14 1544  Wed Dec 15, 2019  1140 Symptoms have resolved. Pulse rate in the 80s, still Afib.   [SJ]  64 Spoke with Trish, Cardiology coordinator. States someone will come see the patient.   [SJ]    Clinical Course User Index [SJ] Bekim Werntz, Helane Gunther, PA-C   MDM Rules/Calculators/A&P HEAR Score: 6                     Patient presents with chest pain.  She does have risk factors for ACS and has previous cardiac stent placement.  Pain resolved without intervention.  I personally reviewed and interpreted the patient's labs and imaging studies. Delta troponins negative. She was initially A. fib RVR.  This responded well to IV metoprolol. We are unsure whether this is chronic or new, however, she is anticoagulated on Coumadin and we did not find another reason in the chart for her to be anticoagulated.  I reviewed multiple charts to try to determine the chronicity of this A. fib, but was unable to do so.  Patient was admitted via cardiology service.  Findings and plan of care discussed with Shirlyn Goltz, MD. Dr. Darl Householder  personally evaluated and examined this patient.  Final Clinical Impression(s) / ED Diagnoses Final diagnoses:  Chest pain, unspecified type  Atrial fibrillation with RVR [facility])    Rx / DC Orders ED Discharge Orders    None       Layla Maw 12/15/19 1546    Drenda Freeze, MD 12/16/19 [phone]

## 2019-12-15 NOTE — Progress Notes (Signed)
ANTICOAGULATION CONSULT NOTE - Initial Consult  Pharmacy Consult for Coumadin Indication: atrial fibrillation  Allergies  Allergen Reactions  . Lasix [Furosemide]   . Other Nausea And Vomiting    ONIONS- avoids alone, can have onion powder. MM, RD 11/03/18  AND [place]    Patient Measurements: Height: 5\' 7"  (170.2 cm) Weight: 168 lb (76.2 kg) IBW/kg (Calculated) : 61.6 Heparin Dosing Weight: N/A  Vital Signs: Temp: 97.5 F (36.4 C) (03/24 1016) Temp Source: Oral (03/24 1016) BP: 150/82 (03/24 1500) Pulse Rate: 95 (03/24 1500)  Labs: Recent Labs    12/15/19 1036 12/15/19 1229  HGB 13.6  --   HCT 41.3  --   PLT 145*  --   LABPROT 38.1*  --   INR 3.9*  --   CREATININE 1.03*  --   TROPONINIHS 8 9    Estimated Creatinine Clearance: 45.6 mL/min (A) (by C-G formula based on SCr of 1.03 mg/dL (H)).   Medical History: Past Medical History:  Diagnosis Date  . Diabetes mellitus, type II ([place])   . Heart attack ([place])   . Heart failure ([place])   . Hyperlipidemia   . Hypertension     Medications:  Scheduled:  Infusions:   Assessment: Stephanie Orozco is an 84 y/o F with new onset Afib. She has a PMH significant for CAD s/p 5 stents, DM2, HTN, HLD, arthritis, wheelchair bound, and multiple DVTs. She is on long term anticoagulation with Coumadin 7.5mg  daily PTA for hx of DVTs. Her last dose of Coumadin was 12/14/19 @ 1700.   CHADSVASC = 5 (HTN, DM, female, Age x 2)  INR today is 3.9. No active bleeding noted. Will hold Coumadin tonight and re-check INR tomorrow.   Goal of Therapy:  INR 2-3 Monitor platelets by anticoagulation protocol: Yes   Plan:  Hold Coumadin tonight Monitor daily INR, H&H, and platelets   Kennon Holter, PharmD PGY1 Ambulatory Care Pharmacy Resident 12/15/2019,3:31 PM

## 2019-12-15 NOTE — H&P (Addendum)
Cardiology Admission History and Physical:   Patient ID: Stephanie Orozco MRN: [id number]; DOB: 11-10-1935   Admission date: 12/15/2019  Primary Care Provider: Monico Blitz, MD Primary Cardiologist: [facility], Dr. Duke Salvia Primary Electrophysiologist:  None   Chief Complaint:  Chest pain  Patient Profile:   Stephanie Orozco is a 84 y.o. female with history of CAD s/p 5 stents, DM2, HTN, HLD, arthritis, wheelchair bound, and multiple DVTs who is being seen for chest pain.   History of Present Illness:   Stephanie Orozco is followed by Dr. Duke Salvia at [facility] for the above cardiac issues. Reviewed records in care everywhere. Patient has a h/o of CAD s//p multiple stents. Last cath was 12/2013 which revealed patent stents in the LAD and RCA with chronic occlusion of the distal right post descending artery, FFR was insignificant. She underwent successful PCI of LCx lesion. She had a Nuclear stress test in July 2017 which was negative for ischemia. Event monitor 07/2014 with brief SVT bears and a brief run of SVT. Last seen 10/15/2016. EKG 07/2017 suggestive of Afib (cannot see tracing) for a pre-op assessment however patient did not follow-up with cardiology.  The patient presented to the ED 12/15/19 for chest pain.The patient had plans to go to her ophthalmologists office for an eye surgery this morning. Before leaving she felt a gradual onset chest pain. It was left sided and tightness, 9/10. It was non-radiating. She felt associated sob. No n/v, diaphoresis. The pain lasted for about 30-45 minutes before easing off. She did not take anything for the pain. Patient is not ambulatory at baseline, uses a walker for the last year. When she went to the ophthalmologist office he decided to cancel the surgery and called EMS to bring her to the ED for further evaluation. Patient said the pain was similar to previous anginal pain but much less severe. She has chronic lower leg swelling for which she wears compression  stockings.  In the ER BP 152/96, pulse 113, afebrile, RR 17, 96% O2. Edema noted on exam. Labs showed potassium 3.1, creatinine 1.03. INR 3.9. Platelets 145. WBC 6.5, Hgb 13.6. HS troponin 8 > 9. EKG with Afib RVR, rate 110. CXR with chronic bronchitic changes. She was given IV Lopressor 10 mg and aspirin 324mg  in the ED. Cardiology was consulted for possible admission.  Patient says she is currently chest pain free. Her rates are better controlled in the 80-90s. Breathing is better. She denies known history of Afib. No h/o of thyroid disease. Drinks 1-2 cups of coffee daily. She recently has been going through a lot of stress with moving to an assisted living this past weekend and trying to sell her house. She uses a wheelchair to get around and has help to perform ADLs.   Past Medical History:  Diagnosis Date  . Diabetes mellitus, type II ([place])   . Heart attack ([place])   . Heart failure ([place])   . Hyperlipidemia   . Hypertension     Past Surgical History:  Procedure Laterality Date  . ABDOMINAL HYSTERECTOMY    . CORONARY ANGIOPLASTY WITH STENT PLACEMENT    . CYSTOSCOPY       Medications Prior to Admission: Prior to Admission medications   Medication Sig Start Date End Date Taking? Authorizing Provider  fesoterodine (TOVIAZ) 8 MG TB24 tablet Take 8 mg by mouth daily.   Yes [provider]  insulin aspart (NOVOLOG) 100 UNIT/ML FlexPen Inject 1-10 Units into the skin 4 (four) times daily -  before meals and at bedtime. Per sliding scale   Yes [provider]  Insulin Glargine (TOUJEO SOLOSTAR) 300 UNIT/ML SOPN Inject 30 Units into the skin daily.    Yes [provider]  isosorbide dinitrate (ISORDIL) 30 MG tablet Take 30 mg by mouth daily.   Yes [provider]  metFORMIN (GLUCOPHAGE) 500 MG tablet Take 500 mg by mouth 3 (three) times daily.    Yes [provider]  omeprazole (PRILOSEC) 20 MG capsule Take 20 mg by mouth daily.   Yes [provider]  rosuvastatin (CRESTOR) 10 MG tablet Take 10 mg by mouth at bedtime.   Yes [provider]  simvastatin (ZOCOR) 40 MG tablet Take 40 mg by mouth at bedtime. 12/09/19  Yes [provider]  torsemide (DEMADEX) 20 MG tablet Take 20 mg by mouth 2 (two) times daily.    Yes [provider]  warfarin (COUMADIN) 7.5 MG tablet Take 7.5 mg by mouth daily.   Yes [provider]  NOVOLOG FLEXPEN 100 UNIT/ML FlexPen INJECT 8-14 UNITS INTO THE SKIN 3 (THREE) TIMES DAILY WITH MEALS. Patient not taking: Reported on 12/15/2019 04/02/17   Cassandria Anger, MD     Allergies:    Allergies  Allergen Reactions  . Lasix [Furosemide]   . Other Nausea And Vomiting    ONIONS- avoids alone, can have onion powder. MM, RD 11/03/18  AND [place]    Social History:   Social History   Socioeconomic History  . Marital status: Married    Spouse name: Not on file  . Number of children: Not on file  . Years of education: Not on file  . Highest education level: Not on file  Occupational History  . Not on file  Tobacco Use  . Smoking status: Never Smoker  . Smokeless tobacco: Never Used  Substance and Sexual Activity  . Alcohol use: No    Alcohol/week: 0.0 standard drinks  . Drug use: No  . Sexual activity: Not on file  Other Topics Concern  . Not on file  Social History Narrative  . Not on file   Social Determinants of Health   Financial Resource Strain:   . Difficulty of Paying Living Expenses:   Food Insecurity:   . Worried About Charity fundraiser in the Last Year:   . Arboriculturist in the Last Year:   Transportation Needs:   . Film/video editor (Medical):   Marland Kitchen Lack of Transportation (Non-Medical):   Physical Activity:   . Days of Exercise per Week:   . Minutes of Exercise per Session:   Stress:   . Feeling of Stress :   Social Connections:   . Frequency of Communication with Friends and Family:   . Frequency of Social Gatherings  with Friends and Family:   . Attends Religious Services:   . Active Member of Clubs or Organizations:   . Attends Archivist Meetings:   Marland Kitchen Marital Status:   Intimate Partner Violence:   . Fear of Current or Ex-Partner:   . Emotionally Abused:   Marland Kitchen Physically Abused:   . Sexually Abused:     Family History:   The patient's family history includes Heart attack in her mother; Hyperlipidemia in her father and sister; Hypertension in her father.    ROS:  Please see the history of present illness.  All other ROS reviewed and negative.     Physical Exam/Data:   Vitals:   12/15/19  1145 12/15/19 1200 12/15/19 1215 12/15/19 1230  BP: 122/85 119/77 109/86 (!) 142/88  Pulse: 90 94 81 95  Resp:      Temp:      TempSrc:      SpO2: 96% 94% 94% 97%  Weight:      Height:       No intake or output data in the 24 hours ending 12/15/19 1246 Last 3 Weights 12/15/2019 12/31/2016 11/26/2016  Weight (lbs) 168 lb 180 lb 175 lb  Weight (kg) 76.204 kg 81.647 kg 79.379 kg     Body mass index is 26.31 kg/m.  General:  Well nourished, well developed, in no acute distress HEENT: normal Lymph: no adenopathy Neck: no JVD Endocrine:  No thryomegaly Vascular: No carotid bruits; FA pulses 2+ bilaterally without bruits  Cardiac:  normal S1, S2; Irreg Irreg; no murmur  Lungs:  clear to auscultation bilaterally, no wheezing, rhonchi or rales  Abd: soft, nontender, no hepatomegaly  Ext: 1+ edema Musculoskeletal:  No deformities, BUE and BLE strength normal and equal Skin: warm and dry  Neuro:  CNs 2-12 intact, no focal abnormalities noted Psych:  Normal affect    EKG:  The ECG that was done 12/15/19 was personally reviewed and demonstrates Afb RVR, HR 110, nonspecific T wave changes  Relevant CV Studies:   Laboratory Data:  High Sensitivity Troponin:   Recent Labs  Lab 12/15/19 1036  TROPONINIHS 8      Chemistry Recent Labs  Lab 12/15/19 1036  NA 144  K 3.1*  CL 103  CO2 28    GLUCOSE 174*  BUN 16  CREATININE 1.03*  CALCIUM 9.3  GFRNONAA 51*  GFRAA 59*  ANIONGAP 13    Recent Labs  Lab 12/15/19 1036  PROT 6.3*  ALBUMIN 3.2*  AST 24  ALT 16  ALKPHOS 56  BILITOT 0.7   Hematology Recent Labs  Lab 12/15/19 1036  WBC 6.5  RBC 4.52  HGB 13.6  HCT 41.3  MCV 91.4  MCH 30.1  MCHC 32.9  RDW 13.6  PLT 145*   BNPNo results for input(s): BNP, PROBNP in the last 168 hours.  DDimer No results for input(s): DDIMER in the last 168 hours.   Radiology/Studies:  DG Chest Portable 1 View  Result Date: 12/15/2019 CLINICAL DATA:  Chest pain, shortness of breath EXAM: PORTABLE CHEST 1 VIEW COMPARISON:  08/14/2018 FINDINGS: The heart size and mediastinal contours are stable. Atherosclerotic calcification of the aortic knob. Chronic increased interstitial markings throughout both lungs. No superimposed airspace consolidation. No pleural effusion or pneumothorax. IMPRESSION: Chronic bronchitic type lung changes. No superimposed airspace opacity. Electronically Signed   By: Davina Poke D.O.   On: 12/15/2019 10:45    HEAR Score (for undifferentiated chest pain):  HEAR Score: 6    Assessment and Plan:   Chest pain/New onset Afib Patient presents with sudden onset chest pain. Hs troponin 8>9. EKG showed Afib RVR. CXR nonacute.  - In patient records an EKG in July 2018 was recorded to show Afib although not mentioned in cardiology notes prior to this and patient did not follow-up with cardiology - Patient is on longterm anticoagulation with warfarin for h/o of DVT. INR 3.9. coumadin per pharmacy. - CHADSVASC = 5 (HTN, DM, female, Age x 2) - Plan to admit to telemetry - check TSH - K+ 3.1 supplement, goal > 4 - check magnesium - Obtain echo - Will start Lopressor 50 mg BID for rate control  CAD s/p multiple PCI -  According to outside records last cath was 12/2013 which revealed patent stents in the LAD and RCA with chronic occlusion of the distal tight post  descending artery, FFR was insignificant. She underwent successful PCI of LCx lesion.  - Normal nuclear stress test in July 2017 - HS troponin negative x 2. Can continue to trend but do not suspect ACS - continue statin, BB  HTN - At home is on Isosorbide Dinitrate 30 mg BID,  Torsemide 20 mg daily. In cardiology notes she was taking Losartan 50 mg daily and Lopressor 100 mg daily. Patient is unsure if she is still taking these. - pressures stable - will restart Lopressor 50 mg BID  HLD - crestor 10 mg daily  H/o of DVT - chronic warfarin - INR 3.9. coumadin per pharmacy   H/o of SVT - seen on event monitor in 07/2014 - Metoprolol for rate control  Venous insufficiency - compression stockings - stable - continue Torsemide 20 mg daily  DM2 - SSI   Severity of Illness: The appropriate patient status for this patient is OBSERVATION. Observation status is judged to be reasonable and necessary in order to provide the required intensity of service to ensure the patient's safety. The patient's presenting symptoms, physical exam findings, and initial radiographic and laboratory data in the context of their medical condition is felt to place them at decreased risk for further clinical deterioration. Furthermore, it is anticipated that the patient will be medically stable for discharge from the hospital within 2 midnights of admission. The following factors support the patient status of observation.   " The patient's presenting symptoms include chest pain. " The physical exam findings include chest pain. " The initial radiographic and laboratory data are EKG with Afib RVR.     For questions or updates, please contact [place] Please consult [email or web address] for contact info under        Signed, Willard Madrigal Ninfa Meeker, PA-C  12/15/2019 12:46 PM   I have personally seen and examined this patient. I agree with the assessment and plan as outlined above.  84 yo female with PAF on  coumadin, DVT/PE, CAD, HTN, HLD, DM presenting to the ED with c/o chest pain this am. She was stressed and going to the eye doctor. Pain is mostly resolved now. Troponin negative x 2. EKG reviewed by me shows atrial fib with rate 100s. Labs reviewed by me. Potassium low. She is followed in [facility] by Dr. Duke Salvia. She has mild chest discomfort currently. My exam:  General: Well developed, well nourished, NAD  HEENT: OP clear, mucus membranes moist  SKIN: warm, dry. No rashes. Neuro: No focal deficits  Musculoskeletal: Muscle strength 5/5 all ext  Psychiatric: Mood and affect normal  Neck: No JVD, no carotid bruits, no thyromegaly, no lymphadenopathy.  Lungs:Clear bilaterally, no wheezes, rhonci, crackles Cardiovascular: Irreg irreg. No murmurs, gallops or rubs. Abdomen:Soft. Bowel sounds present. Non-tender.  Extremities: No lower extremity edema. Pulses are 2 + in the bilateral DP/PT. Plan: Chest pain/History of CAD: Negative troponin . No ischemic eKG changes. Will admit and cycle troponin.  Atrial fib: Not new for her. Documented in previous EKGs at [facility]. She is on coumadin and metoprolol. Unclear what dose of Lopressor she is on. Will start Lopressor 50 mg po BID.   Stephanie Orozco 12/15/2019 3:21 PM

## 2019-12-15 NOTE — ED Notes (Signed)
Placed patient on the bedpan got a urine sample then took patient off of bedpan then place a external cath patient is resting with call bell in reach

## 2019-12-15 NOTE — ED Notes (Signed)
Paged Cards. Per RN

## 2019-12-16 ENCOUNTER — Encounter (HOSPITAL_COMMUNITY): Payer: Self-pay | Admitting: Cardiovascular Disease

## 2019-12-16 ENCOUNTER — Observation Stay (HOSPITAL_BASED_OUTPATIENT_CLINIC_OR_DEPARTMENT_OTHER): Payer: Medicare HMO

## 2019-12-16 DIAGNOSIS — R079 Chest pain, unspecified: Secondary | ICD-10-CM | POA: Diagnosis not present

## 2019-12-16 DIAGNOSIS — Z794 Long term (current) use of insulin: Secondary | ICD-10-CM | POA: Diagnosis not present

## 2019-12-16 DIAGNOSIS — I480 Paroxysmal atrial fibrillation: Secondary | ICD-10-CM | POA: Diagnosis not present

## 2019-12-16 DIAGNOSIS — M7989 Other specified soft tissue disorders: Secondary | ICD-10-CM

## 2019-12-16 DIAGNOSIS — M79609 Pain in unspecified limb: Secondary | ICD-10-CM | POA: Diagnosis not present

## 2019-12-16 DIAGNOSIS — I509 Heart failure, unspecified: Secondary | ICD-10-CM | POA: Diagnosis not present

## 2019-12-16 DIAGNOSIS — E119 Type 2 diabetes mellitus without complications: Secondary | ICD-10-CM | POA: Diagnosis not present

## 2019-12-16 DIAGNOSIS — I4891 Unspecified atrial fibrillation: Secondary | ICD-10-CM

## 2019-12-16 DIAGNOSIS — Z20822 Contact with and (suspected) exposure to covid-19: Secondary | ICD-10-CM | POA: Diagnosis not present

## 2019-12-16 DIAGNOSIS — Z955 Presence of coronary angioplasty implant and graft: Secondary | ICD-10-CM | POA: Diagnosis not present

## 2019-12-16 DIAGNOSIS — I2510 Atherosclerotic heart disease of native coronary artery without angina pectoris: Secondary | ICD-10-CM | POA: Diagnosis not present

## 2019-12-16 LAB — CBC
HCT: 38.3 % (ref 36.0–46.0)
Hemoglobin: 12.7 g/dL (ref 12.0–15.0)
MCH: 30.2 pg (ref 26.0–34.0)
MCHC: 33.2 g/dL (ref 30.0–36.0)
MCV: 91 fL (ref 80.0–100.0)
Platelets: 148 10*3/uL — ABNORMAL LOW (ref 150–400)
RBC: 4.21 MIL/uL (ref 3.87–5.11)
RDW: 13.8 % (ref 11.5–15.5)
WBC: 6.1 10*3/uL (ref 4.0–10.5)
nRBC: 0 % (ref 0.0–0.2)

## 2019-12-16 LAB — BASIC METABOLIC PANEL
Anion gap: 10 (ref 5–15)
BUN: 18 mg/dL (ref 8–23)
CO2: 30 mmol/L (ref 22–32)
Calcium: 9.2 mg/dL (ref 8.9–10.3)
Chloride: 104 mmol/L (ref 98–111)
Creatinine, Ser: 1.05 mg/dL — ABNORMAL HIGH (ref 0.44–1.00)
GFR calc Af Amer: 58 mL/min — ABNORMAL LOW (ref >60)
GFR calc non Af Amer: 50 mL/min — ABNORMAL LOW (ref >60)
Glucose, Bld: 232 mg/dL — ABNORMAL HIGH (ref 70–99)
Potassium: 3.2 mmol/L — ABNORMAL LOW (ref 3.5–5.1)
Sodium: 144 mmol/L (ref 135–145)

## 2019-12-16 LAB — LIPID PANEL
Cholesterol: 100 mg/dL (ref 0–200)
HDL: 23 mg/dL — ABNORMAL LOW (ref >40)
LDL Cholesterol: 57 mg/dL (ref 0–99)
Total CHOL/HDL Ratio: 4.3 RATIO
Triglycerides: 100 mg/dL (ref <150)
VLDL: 20 mg/dL (ref 0–40)

## 2019-12-16 LAB — GLUCOSE, CAPILLARY
Glucose-Capillary: 189 mg/dL — ABNORMAL HIGH (ref 70–99)
Glucose-Capillary: 256 mg/dL — ABNORMAL HIGH (ref 70–99)
Glucose-Capillary: 170 mg/dL — ABNORMAL HIGH (ref 70–99)
Glucose-Capillary: 224 mg/dL — ABNORMAL HIGH (ref 70–99)

## 2019-12-16 LAB — PROTIME-INR
INR: 4 — ABNORMAL HIGH (ref 0.8–1.2)
Prothrombin Time: 38.8 seconds — ABNORMAL HIGH (ref 11.4–15.2)

## 2019-12-16 LAB — TROPONIN I (HIGH SENSITIVITY): Troponin I (High Sensitivity): 11 ng/L (ref <18)

## 2019-12-16 LAB — MAGNESIUM: Magnesium: 1.4 mg/dL — ABNORMAL LOW (ref 1.7–2.4)

## 2019-12-16 MED ORDER — MAGNESIUM SULFATE 2 GM/50ML IV SOLN
2.0000 g | Freq: Once | INTRAVENOUS | Status: AC
  Administered 2019-12-16: 2 g via INTRAVENOUS
  Filled 2019-12-16: qty 50

## 2019-12-16 MED ORDER — POTASSIUM CHLORIDE CRYS ER 20 MEQ PO TBCR
60.0000 meq | EXTENDED_RELEASE_TABLET | Freq: Once | ORAL | Status: AC
  Administered 2019-12-16: 60 meq via ORAL
  Filled 2019-12-16: qty 3

## 2019-12-16 MED ORDER — INSULIN DETEMIR 100 UNIT/ML ~~LOC~~ SOLN
12.0000 [IU] | Freq: Every day | SUBCUTANEOUS | Status: DC
  Administered 2019-12-16: 12 [IU] via SUBCUTANEOUS
  Filled 2019-12-16 (×2): qty 0.12

## 2019-12-16 MED ORDER — MAGNESIUM SULFATE 50 % IJ SOLN: 2.0000 g | Freq: Once | INTRAMUSCULAR | Status: DC

## 2019-12-16 NOTE — Progress Notes (Signed)
Lower extremity venous has been completed.   Preliminary results in CV Proc.   Abram Sander 12/16/2019 2:20 PM

## 2019-12-16 NOTE — Progress Notes (Addendum)
Progress Note  Patient Name: Stephanie Orozco Date of Encounter: 12/16/2019  Primary Cardiologist: No primary care provider on file.   Subjective   No recurrent chest pain. She denies any shortness of breath at rest but notes some with activity although she is wheelchair bound. She states this is not new. No palpitations. She did have some bilateral leg pain overnight. She has chronic venous insufficiency but also history of multiple DVTs. She describes the pain as a jerking sensation - it interfered with her ability to sleep last night.  Inpatient Medications    Scheduled Meds: . fesoterodine  8 mg Oral Daily  . insulin aspart  0-15 Units Subcutaneous TID WC  . insulin aspart  0-5 Units Subcutaneous QHS  . isosorbide dinitrate  30 mg Oral BID  . metoprolol tartrate  50 mg Oral BID  . pantoprazole  40 mg Oral Daily  . rosuvastatin  10 mg Oral QHS  . sodium chloride flush  3 mL Intravenous Q12H  . torsemide  20 mg Oral BID   Continuous Infusions: . sodium chloride     PRN Meds: sodium chloride, acetaminophen, ALPRAZolam, ondansetron (ZOFRAN) IV, sodium chloride flush, traMADol, zolpidem   Vital Signs    Vitals:   12/15/19 2125 12/15/19 2126 12/16/19 0627 12/16/19 0700  BP: (!) 149/86  (!) 164/101 (!) 142/88  Pulse: (!) 109  86 87  Resp: 18  17 (!) 24  Temp:  98.2 F (36.8 C) 98.2 F (36.8 C) 97.8 F (36.6 C)  TempSrc:  Oral Oral Oral  SpO2: 98%  95% 95%  Weight:   78.4 kg   Height:        Intake/Output Summary (Last 24 hours) at 12/16/2019 0924 Last data filed at 12/16/2019 0900 Gross per 24 hour  Intake 783 ml  Output 650 ml  Net 133 ml   Last 3 Weights 12/16/2019 12/15/2019 12/31/2016  Weight (lbs) 172 lb 13.5 oz 168 lb 180 lb  Weight (kg) 78.4 kg 76.204 kg 81.647 kg      Telemetry    Atrial fibrillation with rates in the 80's to 90's. She had 25 beats of non-sustained VT this morning. - Personally Reviewed  ECG    Atrial fibrillation, rate 81 bpm, with  inferior Q waves and non-specific ST/T changes.  - Personally Reviewed  Physical Exam   GEN: No acute distress.   Neck: No JVD Cardiac: Irregularly irregular rhythm with normal rate. No murmurs, rubs, or gallops.  Respiratory: No increased work of breathing. Mild crackles noted in bilateral bases.  GI: Soft, nontender, non-distended.  MS: 1+ edema of bilateral lower extremities with hyperpigmentation of skin consistent with chronic venous insufficiency. No deformity. Neuro:  No focal deficits. Psych: Normal affect. Responds appropriately.   Labs    High Sensitivity Troponin:   Recent Labs  Lab 12/15/19 1036 12/15/19 1229 12/16/19 0000  TROPONINIHS 8 9 11       Chemistry Recent Labs  Lab 12/15/19 1036 12/16/19 0000  NA 144 144  K 3.1* 3.2*  CL 103 104  CO2 28 30  GLUCOSE 174* 232*  BUN 16 18  CREATININE 1.03* 1.05*  CALCIUM 9.3 9.2  PROT 6.3*  --   ALBUMIN 3.2*  --   AST 24  --   ALT 16  --   ALKPHOS 56  --   BILITOT 0.7  --   GFRNONAA 51* 50*  GFRAA 59* 58*  ANIONGAP 13 10     Hematology Recent Labs  Lab 12/15/19 1036 12/16/19 0000  WBC 6.5 6.1  RBC 4.52 4.21  HGB 13.6 12.7  HCT 41.3 38.3  MCV 91.4 91.0  MCH 30.1 30.2  MCHC 32.9 33.2  RDW 13.6 13.8  PLT 145* 148*    BNPNo results for input(s): BNP, PROBNP in the last 168 hours.   DDimer No results for input(s): DDIMER in the last 168 hours.   Radiology    DG Chest Portable 1 View  Result Date: 12/15/2019 CLINICAL DATA:  Chest pain, shortness of breath EXAM: PORTABLE CHEST 1 VIEW COMPARISON:  08/14/2018 FINDINGS: The heart size and mediastinal contours are stable. Atherosclerotic calcification of the aortic knob. Chronic increased interstitial markings throughout both lungs. No superimposed airspace consolidation. No pleural effusion or pneumothorax. IMPRESSION: Chronic bronchitic type lung changes. No superimposed airspace opacity. Electronically Signed   By: Davina Poke D.O.   On:  12/15/2019 10:45   ECHOCARDIOGRAM COMPLETE  Result Date: 12/15/2019    ECHOCARDIOGRAM REPORT   Patient Name:   Stephanie Orozco Date of Exam: 12/15/2019 Medical Rec #:  [id number]      Height:       67.0 in Accession #:    [id number]     Weight:       168.0 lb Date of Birth:  September 21, 1936      BSA:          1.878 m Patient Age:    84 years       BP:           165/135 mmHg Patient Gender: F              HR:           110 bpm. Exam Location:  Inpatient Procedure: 2D Echo Indications:    Atrial fibrillation and Flutter  History:        Patient has no prior history of Echocardiogram examinations.                 Arrythmias:Atrial Fibrillation.  Sonographer:    Clayton Lefort RDCS (AE) Referring Phys: [id number] [place]  1. Left ventricular ejection fraction, by estimation, is 60 to 65%. The left ventricle has normal function. The left ventricle has no regional wall motion abnormalities. There is severe left ventricular hypertrophy. Left ventricular diastolic parameters  are indeterminate.  2. Right ventricular systolic function is normal. The right ventricular size is normal. There is normal pulmonary artery systolic pressure.  3. The mitral valve is normal in structure. Trivial mitral valve regurgitation. No evidence of mitral stenosis.  4. The aortic valve is tricuspid. Aortic valve regurgitation is trivial. Mild to moderate aortic valve sclerosis/calcification is present, without any evidence of aortic stenosis.  5. The inferior vena cava is normal in size with greater than 50% respiratory variability, suggesting right atrial pressure of 3 mmHg. FINDINGS  Left Ventricle: Left ventricular ejection fraction, by estimation, is 60 to 65%. The left ventricle has normal function. The left ventricle has no regional wall motion abnormalities. The left ventricular internal cavity size was normal in size. There is  severe left ventricular hypertrophy. Left ventricular diastolic parameters are indeterminate. Right  Ventricle: The right ventricular size is normal. No increase in right ventricular wall thickness. Right ventricular systolic function is normal. There is normal pulmonary artery systolic pressure. The tricuspid regurgitant velocity is 2.21 m/s, and  with an assumed right atrial pressure of 10 mmHg, the estimated right ventricular systolic pressure is 99991111 mmHg. Left Atrium:  Left atrial size was normal in size. Right Atrium: Right atrial size was normal in size. Pericardium: There is no evidence of pericardial effusion. Mitral Valve: The mitral valve is normal in structure. There is moderate thickening of the mitral valve leaflet(s). There is mild calcification of the mitral valve leaflet(s). Normal mobility of the mitral valve leaflets. Moderate mitral annular calcification. Trivial mitral valve regurgitation. No evidence of mitral valve stenosis. Tricuspid Valve: The tricuspid valve is normal in structure. Tricuspid valve regurgitation is mild . No evidence of tricuspid stenosis. Aortic Valve: The aortic valve is tricuspid. Aortic valve regurgitation is trivial. Aortic regurgitation PHT measures 715 msec. Mild to moderate aortic valve sclerosis/calcification is present, without any evidence of aortic stenosis. Aortic valve mean gradient measures 7.0 mmHg. Aortic valve peak gradient measures 12.5 mmHg. Aortic valve area, by VTI measures 1.30 cm. Pulmonic Valve: The pulmonic valve was normal in structure. Pulmonic valve regurgitation is trivial. No evidence of pulmonic stenosis. Aorta: The aortic root is normal in size and structure. Venous: The inferior vena cava was not well visualized. The inferior vena cava is normal in size with greater than 50% respiratory variability, suggesting right atrial pressure of 3 mmHg. IAS/Shunts: No atrial level shunt detected by color flow Doppler.  LEFT VENTRICLE PLAX 2D LVIDd:         3.70 cm LVIDs:         2.30 cm LV PW:         1.80 cm LV IVS:        2.10 cm LVOT diam:     1.80  cm LV SV:         39 LV SV Index:   21 LVOT Area:     2.54 cm  RIGHT VENTRICLE            IVC RV Basal diam:  2.90 cm    IVC diam: 1.80 cm RV S prime:     9.36 cm/s TAPSE (M-mode): 1.4 cm LEFT ATRIUM             Index       RIGHT ATRIUM           Index LA diam:        3.20 cm 1.70 cm/m  RA Area:     13.30 cm LA Vol (A2C):   68.0 ml 36.21 ml/m RA Volume:   33.50 ml  17.84 ml/m LA Vol (A4C):   37.6 ml 20.02 ml/m LA Biplane Vol: 53.8 ml 28.65 ml/m  AORTIC VALVE AV Area (Vmax):    1.18 cm AV Area (Vmean):   1.19 cm AV Area (VTI):     1.30 cm AV Vmax:           177.00 cm/s AV Vmean:          122.000 cm/s AV VTI:            0.299 m AV Peak Grad:      12.5 mmHg AV Mean Grad:      7.0 mmHg LVOT Vmax:         82.36 cm/s LVOT Vmean:        57.260 cm/s LVOT VTI:          0.153 m LVOT/AV VTI ratio: 0.51 AI PHT:            715 msec  AORTA Ao Root diam: 3.20 cm Ao Asc diam:  3.50 cm TRICUSPID VALVE TR Peak grad:   19.5 mmHg TR Vmax:  221.00 cm/s  SHUNTS Systemic VTI:  0.15 m Systemic Diam: 1.80 cm Jenkins Rouge MD Electronically signed by Jenkins Rouge MD Signature Date/Time: 12/15/2019/5:35:45 PM    Final     Cardiac Studies   Echocardiogram 12/15/2019: Impressions: 1. Left ventricular ejection fraction, by estimation, is 60 to 65%. The  left ventricle has normal function. The left ventricle has no regional  wall motion abnormalities. There is severe left ventricular hypertrophy.  Left ventricular diastolic parameters  are indeterminate.  2. Right ventricular systolic function is normal. The right ventricular  size is normal. There is normal pulmonary artery systolic pressure.  3. The mitral valve is normal in structure. Trivial mitral valve  regurgitation. No evidence of mitral stenosis.  4. The aortic valve is tricuspid. Aortic valve regurgitation is trivial.  Mild to moderate aortic valve sclerosis/calcification is present, without  any evidence of aortic stenosis.  5. The inferior vena  cava is normal in size with greater than 50%  respiratory variability, suggesting right atrial pressure of 3 mmHg.  Patient Profile   Ms. Munier is a 84 y.o. female with a history of CAD s/p multiple PCI, atrial fibrillation, hypertension, hyperlipidemia, type 2 diabetes mellitus, multiple DVTS, arthritis, and wheelchair bound who is being seen today for chest pain.   Assessment & Plan    Chest Pain - Patient presents with sudden onset chest pain and found to be in atrial fibrillation with RVR.  - High-sensitivity troponin negative x3.  - EKG shows no acute ST/T changes. - Echo yesterday showed LVEF of 60-65% with normal wall motion but severe LVH. No significant valvular disease. - No recurrent chest pain. - Continue Aspirin 81mg  daily, Lopressor 50mg  twice daily, Isordil 30mg  twice daily, and Crestor 10mg  daily. - Chest pain atypical. No further ischemic evaluation felt necessary at this time.  Atrial Fibrillation / Non-Sustained VT - Rates well controlled in the 80's to 90's on telemetry. She did have 25 beats of non-sustained VT this morning thought. Asymptomatic with this.  - TSH low at 0.136 but free T4 normal.  - Magnesium 1.3 yesterday. Will replete.  - Potassium 3.2 today. Will replete.  - Continue Lopressor as above. - Continue chronic anticoagulation with Coumadin. INR 4.0 today. Dosing per pharmacy.   Hypertension - BP mildly elevated at 142/88 this morning but this was before morning medications. - Continue Isordile 30mg  twice daily and Lopressor 50mg  twice daily.  Hyperlipidemia - Lipid panel this admission; Total Cholesterol 100, Triglycerides 100, HDL 23, LDL 57. - Continue home Crestor 10mg  daily.  Legt Pain with History of Multiple DVTs - Patient complaining of leg pain overnight that made it difficult for her to sleep.  - Anterior legs are mildly erythematous but this looks more consistent with chronic venous insufficiency. - INR has been supratherapeutic but  given history will order lower extremity dopplers.   Chronic Venous Insufficiency - Continue Torsemide 20mg  daily. - OK to continue compression stockings.   For questions or updates, please contact [place] Please consult [email or web address] for contact info under        Signed, Darreld Mclean, PA-C  12/16/2019, 9:24 AM    Patient seen and examined with Sande Rives PA-C.  Agree as above, with the following exceptions and changes as noted below. She has leg discomfort and distended veins in her legs. No recurrent chest pain. Gen: NAD, CV: iRRR, no murmurs, Lungs: clear, Abd: soft, Extrem: Warm, well perfused, no edema, Neuro/Psych: alert and oriented x 3, normal mood  and affect. All available labs, radiology testing, previous records reviewed. Will correct electrolytes and obtain LE dopplers for distended veins, leg pain and hx of multiple DVTs despite supratherapeutic INR.   Elouise Munroe 12/16/19 2:09 PM

## 2019-12-16 NOTE — Progress Notes (Signed)
Inpatient Diabetes Program Recommendations  AACE/ADA: New Consensus Statement on Inpatient Glycemic Control (2015)  Target Ranges:  Prepandial:   less than 140 mg/dL      Peak postprandial:   less than 180 mg/dL (1-2 hours)      Critically ill patients:  140 - 180 mg/dL   Lab Results  Component Value Date   GLUCAP 256 (H) 12/16/2019   HGBA1C 8.4 (H) 12/15/2019    Review of Glycemic Control Results for Stephanie Orozco, Stephanie Orozco (MRN [id number]) as of 12/16/2019 11:37  Ref. Range 12/15/2019 21:24 12/15/2019 22:37 12/16/2019 07:51 12/16/2019 11:18  Glucose-Capillary Latest Ref Range: 70 - 99 mg/dL 216 (H) 212 (H) 189 (H) 256 (H)   Diabetes history: Type 2 DM Outpatient Diabetes medications: Toujeo 30 units QD, Novolog 1-10 QID, Metformin 500 mg BID Current orders for Inpatient glycemic control: Novolog 0-15 units TID, Novolog 0-5 units QHS  Inpatient Diabetes Program Recommendations:    Consider adding Levemir 12 units QD.   Thanks, Bronson Curb, MSN, RNC-OB Diabetes Coordinator [phone] (8a-5p)

## 2019-12-16 NOTE — Progress Notes (Signed)
Stephanie Orozco for Coumadin Indication: atrial fibrillation  Allergies  Allergen Reactions  . Lasix [Furosemide]   . Other Nausea And Vomiting    ONIONS- avoids alone, can have onion powder. MM, RD 11/03/18  AND CANTELOUPE    Patient Measurements: Height: 5\' 7"  (170.2 cm) Weight: 172 lb 13.5 oz (78.4 kg) IBW/kg (Calculated) : 61.6 Heparin Dosing Weight: N/A  Vital Signs: Temp: 97.8 F (36.6 C) (03/25 0700) Temp Source: Oral (03/25 0700) BP: 142/88 (03/25 0700) Pulse Rate: 87 (03/25 0700)  Labs: Recent Labs    12/15/19 1036 12/15/19 1229 12/16/19 0000  HGB 13.6  --  12.7  HCT 41.3  --  38.3  PLT 145*  --  148*  LABPROT 38.1*  --  38.8*  INR 3.9*  --  4.0*  CREATININE 1.03*  --  1.05*  TROPONINIHS 8 9 11     Estimated Creatinine Clearance: 45.3 mL/min (A) (by C-G formula based on SCr of 1.05 mg/dL (H)).   Medical History: Past Medical History:  Diagnosis Date  . Diabetes mellitus, type II ([place])   . Heart attack ([place])   . Heart failure ([place])   . HOH (hard of hearing)   . Hyperlipidemia   . Hypertension     Medications:  Scheduled:  Infusions:   Assessment: Stephanie Orozco is an 84 y/o F with new onset Afib. She has a PMH significant for CAD s/p 5 stents, DM2, HTN, HLD, arthritis, wheelchair bound, and multiple DVTs. She is on long term anticoagulation with Coumadin 7.5mg  daily PTA for hx of DVTs. Her last dose of Coumadin was 12/14/19 @ 1700.   CHADSVASC = 5 (HTN, DM, female, Age x 2)  INR today came back supratherapeutic at 4. Hgb 12.7, plt 148. No s/sx of bleeding.   Goal of Therapy:  INR 2-3 Monitor platelets by anticoagulation protocol: Yes   Plan:  Hold Coumadin tonight Monitor daily INR, H&H, and platelets  Antonietta Jewel, PharmD, BCCCP Clinical Pharmacist  Phone: [phone]  Please check AMION for all [place] phone numbers After 10:00 PM, call [place] [phone] 12/16/2019,1:21 PM

## 2019-12-16 NOTE — Progress Notes (Signed)
Telemetry called to notify RN that patient had 25 beats of ventricular tachycardia.  RN into check on patient and nurse tech was in the room giving patient a bath.  Nurse tech reported when the monitor started alarming she was washing the patient's front side.

## 2019-12-16 NOTE — Care Management Obs Status (Signed)
[facility] NOTIFICATION   Patient Details  Name: YANIAH NORFLEET MRN: [id number] Date of Birth: 1936/09/09   Medicare Observation Status Notification Given:  Yes    Bethena Roys, RN 12/16/2019, 4:53 PM

## 2019-12-17 DIAGNOSIS — R079 Chest pain, unspecified: Secondary | ICD-10-CM

## 2019-12-17 DIAGNOSIS — I872 Venous insufficiency (chronic) (peripheral): Secondary | ICD-10-CM

## 2019-12-17 DIAGNOSIS — I4891 Unspecified atrial fibrillation: Secondary | ICD-10-CM | POA: Diagnosis not present

## 2019-12-17 DIAGNOSIS — I499 Cardiac arrhythmia, unspecified: Secondary | ICD-10-CM | POA: Diagnosis not present

## 2019-12-17 DIAGNOSIS — M2550 Pain in unspecified joint: Secondary | ICD-10-CM | POA: Diagnosis not present

## 2019-12-17 DIAGNOSIS — Z7401 Bed confinement status: Secondary | ICD-10-CM | POA: Diagnosis not present

## 2019-12-17 DIAGNOSIS — Z86718 Personal history of other venous thrombosis and embolism: Secondary | ICD-10-CM

## 2019-12-17 DIAGNOSIS — R0789 Other chest pain: Secondary | ICD-10-CM | POA: Diagnosis not present

## 2019-12-17 LAB — GLUCOSE, CAPILLARY
Glucose-Capillary: 260 mg/dL — ABNORMAL HIGH (ref 70–99)
Glucose-Capillary: 268 mg/dL — ABNORMAL HIGH (ref 70–99)

## 2019-12-17 LAB — CBC
HCT: 40.2 % (ref 36.0–46.0)
Hemoglobin: 13.3 g/dL (ref 12.0–15.0)
MCH: 30.4 pg (ref 26.0–34.0)
MCHC: 33.1 g/dL (ref 30.0–36.0)
MCV: 91.8 fL (ref 80.0–100.0)
Platelets: 144 10*3/uL — ABNORMAL LOW (ref 150–400)
RBC: 4.38 MIL/uL (ref 3.87–5.11)
RDW: 13.8 % (ref 11.5–15.5)
WBC: 6.2 10*3/uL (ref 4.0–10.5)
nRBC: 0 % (ref 0.0–0.2)

## 2019-12-17 LAB — BASIC METABOLIC PANEL
Anion gap: 9 (ref 5–15)
BUN: 19 mg/dL (ref 8–23)
CO2: 29 mmol/L (ref 22–32)
Calcium: 8.8 mg/dL — ABNORMAL LOW (ref 8.9–10.3)
Chloride: 103 mmol/L (ref 98–111)
Creatinine, Ser: 1.09 mg/dL — ABNORMAL HIGH (ref 0.44–1.00)
GFR calc Af Amer: 55 mL/min — ABNORMAL LOW (ref >60)
GFR calc non Af Amer: 48 mL/min — ABNORMAL LOW (ref >60)
Glucose, Bld: 258 mg/dL — ABNORMAL HIGH (ref 70–99)
Potassium: 3.8 mmol/L (ref 3.5–5.1)
Sodium: 141 mmol/L (ref 135–145)

## 2019-12-17 LAB — PROTIME-INR
INR: 2.6 — ABNORMAL HIGH (ref 0.8–1.2)
Prothrombin Time: 28 seconds — ABNORMAL HIGH (ref 11.4–15.2)

## 2019-12-17 LAB — MAGNESIUM: Magnesium: 1.7 mg/dL (ref 1.7–2.4)

## 2019-12-17 MED ORDER — WARFARIN SODIUM 5 MG PO TABS: 5.0000 mg | ORAL_TABLET | Freq: Every day | ORAL | 0 refills | Status: AC

## 2019-12-17 MED ORDER — ISOSORBIDE DINITRATE 30 MG PO TABS: 30.0000 mg | ORAL_TABLET | Freq: Two times a day (BID) | ORAL | 2 refills | Status: AC

## 2019-12-17 MED ORDER — WARFARIN SODIUM 7.5 MG PO TABS: 7.5000 mg | ORAL_TABLET | Freq: Once | ORAL | Status: DC

## 2019-12-17 MED ORDER — METFORMIN HCL 500 MG PO TABS: 500.0000 mg | ORAL_TABLET | Freq: Two times a day (BID) | ORAL | Status: DC

## 2019-12-17 MED ORDER — WARFARIN - PHARMACIST DOSING INPATIENT: Freq: Every day | Status: DC

## 2019-12-17 MED ORDER — POTASSIUM CHLORIDE ER 20 MEQ PO TBCR: 20.0000 meq | EXTENDED_RELEASE_TABLET | Freq: Two times a day (BID) | ORAL | 2 refills | Status: AC

## 2019-12-17 MED ORDER — METFORMIN HCL 500 MG PO TABS: ORAL_TABLET | ORAL | Status: AC

## 2019-12-17 MED ORDER — METOPROLOL TARTRATE 50 MG PO TABS: 50.0000 mg | ORAL_TABLET | Freq: Two times a day (BID) | ORAL | 2 refills | Status: AC

## 2019-12-17 NOTE — Discharge Instructions (Addendum)
Please take all medications as directed on discharge paperwork. Please call your primary care physician and your Cardiologist to schedule a follow-up visit with both of them within the next 2 weeks.  You will need repeat labs (BMET, Mg, and INR) checked at your [facility] on Monday 12/20/2019.  If you decide that you would like to follow-up in our office, here is our contract information:  [facility] ([facility]): [address] [place], [place] [address] Phone #: [phone]  [place]: [place], [place] [address] Phone #: [phone]    Information on my medicine - Coumadin   (Warfarin)  Why was Coumadin prescribed for you? Coumadin was prescribed for you because you have a blood clot or a medical condition that can cause an increased risk of forming blood clots. Blood clots can cause serious health problems by blocking the flow of blood to the heart, lung, or brain. Coumadin can prevent harmful blood clots from forming. As a reminder your indication for Coumadin is:   Hx of DVTs  What test will check on my response to Coumadin? While on Coumadin (warfarin) you will need to have an INR test regularly to ensure that your dose is keeping you in the desired range. The INR (international normalized ratio) number is calculated from the result of the laboratory test called prothrombin time (PT).  If an INR APPOINTMENT HAS NOT ALREADY BEEN MADE FOR YOU please schedule an appointment to have this lab work done by your health care provider within 7 days. Your INR goal is usually a number between:  2 to 3 or your provider may give you a more narrow range like 2-2.5.  Ask your health care provider during an office visit what your goal INR is.  What  do you need to  know  About  COUMADIN? Take Coumadin (warfarin) exactly as prescribed by your healthcare provider about the same time each day.  DO NOT stop taking  without talking to the doctor who prescribed the medication.  Stopping without other blood clot prevention medication to take the place of Coumadin may increase your risk of developing a new clot or stroke.  Get refills before you run out.  What do you do if you miss a dose? If you miss a dose, take it as soon as you remember on the same day then continue your regularly scheduled regimen the next day.  Do not take two doses of Coumadin at the same time.  Important Safety Information A possible side effect of Coumadin (Warfarin) is an increased risk of bleeding. You should call your healthcare provider right away if you experience any of the following: ? Bleeding from an injury or your nose that does not stop. ? Unusual colored urine (red or dark brown) or unusual colored stools (red or black). ? Unusual bruising for unknown reasons. ? A serious fall or if you hit your head (even if there is no bleeding).  Some foods or medicines interact with Coumadin (warfarin) and might alter your response to warfarin. To help avoid this: ? Eat a balanced diet, maintaining a consistent amount of Vitamin K. ? Notify your provider about major diet changes you plan to make. ? Avoid alcohol or limit your intake to 1 drink for women and 2 drinks for men per day. (1 drink is 5 oz. wine, 12 oz. beer, or 1.5 oz. liquor.)  Make sure that ANY health care provider  who prescribes medication for you knows that you are taking Coumadin (warfarin).  Also make sure the healthcare provider who is monitoring your Coumadin knows when you have started a new medication including herbals and non-prescription products.  Coumadin (Warfarin)  Major Drug Interactions  Increased Warfarin Effect Decreased Warfarin Effect  Alcohol (large quantities) Antibiotics (esp. Septra/Bactrim, Flagyl, Cipro) Amiodarone (Cordarone) Aspirin (ASA) Cimetidine (Tagamet) Megestrol (Megace) NSAIDs (ibuprofen, naproxen, etc.) Piroxicam  (Feldene) Propafenone (Rythmol SR) Propranolol (Inderal) Isoniazid (INH) Posaconazole (Noxafil) Barbiturates (Phenobarbital) Carbamazepine (Tegretol) Chlordiazepoxide (Librium) Cholestyramine (Questran) Griseofulvin Oral Contraceptives Rifampin Sucralfate (Carafate) Vitamin K   Coumadin (Warfarin) Major Herbal Interactions  Increased Warfarin Effect Decreased Warfarin Effect  Garlic Ginseng Ginkgo biloba Coenzyme Q10 Green tea St. John's wort    Coumadin (Warfarin) FOOD Interactions  Eat a consistent number of servings per week of foods HIGH in Vitamin K (1 serving =  cup)  Collards (cooked, or boiled & drained) Kale (cooked, or boiled & drained) Mustard greens (cooked, or boiled & drained) Parsley *serving size only =  cup Spinach (cooked, or boiled & drained) Swiss chard (cooked, or boiled & drained) Turnip greens (cooked, or boiled & drained)  Eat a consistent number of servings per week of foods MEDIUM-HIGH in Vitamin K (1 serving = 1 cup)  Asparagus (cooked, or boiled & drained) Broccoli (cooked, boiled & drained, or raw & chopped) Brussel sprouts (cooked, or boiled & drained) *serving size only =  cup Lettuce, raw (green leaf, endive, romaine) Spinach, raw Turnip greens, raw & chopped   These websites have more information on Coumadin (warfarin):  FailFactory.se; VeganReport.com.au;

## 2019-12-17 NOTE — Social Work (Signed)
CSW sent forwarded discharge summary to [place] with [facility]. ALF reviewing and will follow-up with CSW.  Criss Alvine, CSW

## 2019-12-17 NOTE — Progress Notes (Signed)
[place] for Coumadin Indication: atrial fibrillation  Allergies  Allergen Reactions  . Lasix [Furosemide]   . Other Nausea And Vomiting    ONIONS- avoids alone, can have onion powder. MM, RD 11/03/18  AND CANTELOUPE    Patient Measurements: Height: 5\' 7"  (170.2 cm) Weight: 169 lb 15.6 oz (77.1 kg) IBW/kg (Calculated) : 61.6 Heparin Dosing Weight: N/A  Vital Signs: Temp: 98 F (36.7 C) (03/26 0346) Temp Source: Oral (03/26 0346)  Labs: Recent Labs    12/15/19 1036 12/15/19 1036 12/15/19 1229 12/16/19 0000 12/17/19 0324  HGB 13.6   < >  --  12.7 13.3  HCT 41.3  --   --  38.3 40.2  PLT 145*  --   --  148* 144*  LABPROT 38.1*  --   --  38.8* 28.0*  INR 3.9*  --   --  4.0* 2.6*  CREATININE 1.03*  --   --  1.05* 1.09*  TROPONINIHS 8  --  9 11  --    < > = values in this interval not displayed.    Estimated Creatinine Clearance: 43.3 mL/min (A) (by C-G formula based on SCr of 1.09 mg/dL (H)).   Medical History: Past Medical History:  Diagnosis Date  . Diabetes mellitus, type II ([place])   . Heart attack ([place])   . Heart failure ([place])   . HOH (hard of hearing)   . Hyperlipidemia   . Hypertension     Assessment: Ms Biddulph is an 84 y/o F with new onset Afib. She has a PMH significant for CAD s/p 5 stents, DM2, HTN, HLD, arthritis, wheelchair bound, and multiple DVTs. She is on long term anticoagulation with Coumadin 7.5mg  daily PTA for hx of DVTs. Her last dose of Coumadin was 12/14/19 @ 1700.   CHADSVASC = 5 (HTN, DM, female, Age x 2)  INR today came back therapeutic at 2.6, warfarin has been on hold for 3 nights. Hgb 13, plt 144. No s/sx of bleeding.   Goal of Therapy:  INR 2-3 Monitor platelets by anticoagulation protocol: Yes   Plan:  Restart warfarin 7.5 mg tonight (anticipate INR falling further given recently held doses) Given supratherapeutic INR on admission, would consider discharging on warfarin 5 mg with close INR  follow-up for adjustments Monitor daily INR, H&H, and platelets  Antonietta Jewel, PharmD, BCCCP Clinical Pharmacist  Phone: [phone]  Please check AMION for all [place] phone numbers After 10:00 PM, call [place] [phone] 12/17/2019,11:24 AM

## 2019-12-17 NOTE — Progress Notes (Signed)
Inpatient Diabetes Program Recommendations  AACE/ADA: New Consensus Statement on Inpatient Glycemic Control (2015)  Target Ranges:  Prepandial:   less than 140 mg/dL      Peak postprandial:   less than 180 mg/dL (1-2 hours)      Critically ill patients:  140 - 180 mg/dL   Lab Results  Component Value Date   GLUCAP 268 (H) 12/17/2019   HGBA1C 8.4 (H) 12/15/2019    Review of Glycemic Control Results for Stephanie Orozco, Stephanie Orozco (MRN [id number]) as of 12/17/2019 12:41  Ref. Range 12/16/2019 21:41 12/17/2019 07:47 12/17/2019 11:15  Glucose-Capillary Latest Ref Range: 70 - 99 mg/dL 224 (H) 260 (H) 268 (H)   Diabetes history: Type 2 DM Outpatient Diabetes medications: Toujeo 30 units QD, Novolog 1-10 QID, Metformin 500 mg BID Current orders for Inpatient glycemic control: Novolog 0-15 units TID, Novolog 0-5 units QHS, Lantus 12 units QD  Inpatient Diabetes Program Recommendations:    Consider increasing Lantus to 20 units QHS.   Thanks, Bronson Curb, MSN, RNC-OB Diabetes Coordinator [phone] (8a-5p)

## 2019-12-17 NOTE — Discharge Summary (Addendum)
Discharge Summary    Patient ID: Stephanie Orozco MRN: [id number]; DOB: 30-Aug-1936  Admit date: 12/15/2019 Discharge date: 12/17/2019  Primary Care Provider: Monico Blitz, MD  Primary Cardiologist: [facility] (Dr. Duke Salvia) Primary Electrophysiologist:  None   Discharge Diagnoses    Principal Problem:   Atrial fibrillation with rapid ventricular response [facility]) Active Problems:   Chest pain   Type 2 diabetes mellitus with complication, with long-term current use of insulin (HCC)   Hypertension   Hyperlipidemia   Chronic venous insufficiency   History of DVT (deep vein thrombosis)  Diagnostic Studies/Procedures    Echocardiogram 12/15/2019:  Impressions:  1. Left ventricular ejection fraction, by estimation, is 60 to 65%. The  left ventricle has normal function. The left ventricle has no regional  wall motion abnormalities. There is severe left ventricular hypertrophy.  Left ventricular diastolic parameters  are indeterminate.  2. Right ventricular systolic function is normal. The right ventricular  size is normal. There is normal pulmonary artery systolic pressure.  3. The mitral valve is normal in structure. Trivial mitral valve  regurgitation. No evidence of mitral stenosis.  4. The aortic valve is tricuspid. Aortic valve regurgitation is trivial.  Mild to moderate aortic valve sclerosis/calcification is present, without  any evidence of aortic stenosis.  5. The inferior vena cava is normal in size with greater than 50%  respiratory variability, suggesting right atrial pressure of 3 mmHg.  _______________  Lower Extremity Ultrasounds 12/16/2019:  Summary:  - No evidence of deep vein thrombosis seen in the lower extremities,  bilaterally.   History of Present Illness     Stephanie Orozco is a 84 year old female with a history of CAD s/p multiple PCI, atrial fibrillation, hypertension, hyperlipidemia, type 2 diabetes mellitus, multiple DVTs on Coumadin, arthritis, and  wheelchair bound who is followed by Dr. Duke Salvia at [facility] for her cardiac care. She has a history of CAD with multiple stents. During last cardiac catheterization in 12/2013, she underwent successful PCI of proximal 90% LCX lesion. Also noted to have 60% stenosis of mid RCA (not significant by FFR), 100% stenosis of PDA, 60% stenosis of D1, and patient LAD stent. Nuclear stress test in 03/2016 was negative for ischemia. It does not look like patient has been seen by Cardiology since 2018. EKG in [place] in 07/2017 reportedly shows atrial fibrillation (cannot personally review tracing) during pre-op assessment; however, it does not look like patient followed up with Cardiology.   The patient presented to the ED on 12/15/19 for chest pain. She stated she went to [place] office for an eye surgery on morning of procedure. Before leaving, she felt a gradual onset of left sided non-radiating chest tightness that she ranked as a 9/10 on the pain scale. She reported associated shortness of breath but no nausea/vomiting or diaphoresis. The pain lasted for about 30-45 minutes before easing off. She did not take anything for the pain. Patient is not ambulatory at baseline and uses a walker for the last year. Ophthalmologist decided to cancel the surgery and called EMS to bring her to the ED for further evaluation. Patient said the pain was similar to previous anginal pain but much less severe. She has chronic lower leg swelling for which she wears compression stockings.    In the ER, BP 152/96, pulse 113, afebrile, RR 17, 96% O2. EKG showed atrial fibrillation with rate of 110 bpm. High-sensitivity troponin negative at 8 >> 9. Chest x-ray showed chronic bronchitis changes. WBC 6.5,  Hgb 13.6, Plts 145. K 3.1, Cr 1.03.She was given IV Lopressor 10 mg and Aspirin 324mg  in the ED. Cardiology was consulted for possible admission.    At the time of initial evaluation in the ED, patient chest pain free and  rates were better controlled in the 80's to 90's. She also reported that she was breathing better. No history of thyroid disease. She recently has been going through a lot of stress with moving to an assisted living facility this past weekend and trying to sell her house. She uses a wheelchair to get around and has help to perform ADLs.    Hospital Course     Consultants: Diabetes Coordinator  Atrial Fibrillation / Non-Sustained VT Patient presented with sudden onset chest pain at rest and was found to be in atrial fibrillation with RVR as stated above. Rates improved with dose of IV Lopressor in the ED. Potassium and Magnesium were low so these were supplement. TSH low 0.136 but free T4 normal. Echocardiogram showed LVEF of 60-65% with normal wall motion but severe LVH. Per prior Cardiology note in Care Everywhere, patient on Lopressor 50mg  twice daily; however, patient was unsure if she was still taking this on admission.  We started her on this dose in the hospital and rates remained mostly well controlled in the 80's to 90's on telemetry with a few brief spikes in the low 100's. She did have a few runs of non-sustained VT (longest run 25 beats) but was asymptomatic with this. This improved after Potassium and Magnesium were repleted.  - Will continue Lopressor 50mg  twice daily. - Continue chronic anticoagulation with Coumadin. INR supratherapeutic at 3.9 on admission. INR 2.6 today. Discussed with Pharmacy. Patient will receive Coumadin 7.5mg  prior to discharge will plans to start 5mg  daily tomorrow. She will need close INR follow-ups for adjustments. Needs INR and CBC rechecked on Monday 12/20/2019. INR is followed by PCP.   Chest Pain Troponin remained negative. 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 No chest discomfort at this  time. Echo showed LVEF of 60-65% with normal wall motion but severe LVH. No significant valvular disease was noted. Chest pain felt to be very atypical and no further ischemic evaluation necessary at this time.   - Continue Aspirin 81mg  daily, Lopressor 50mg  twice daily, Isordil 30mg  twice daily (reportedly taking once daily prior to admission), and Crestor 10mg  daily.  Hypertension BP mildly elevated at times but well controlled this morning.mildly  - Continue Isordile 30mg  twice daily and Lopressor 50mg  twice daily.  Hyperlipidemia Lipid panel this admission; Total Cholesterol 100, Triglycerides 100, HDL 23, LDL 57. - Both Crestor 10mg  daily and Simvastatin 40mg  daily listed under home medications. Patient could not remember what she was taking at home. Called her Pharmacy and clarified that she is taking Crestor at home. Will continue this at discharge.  Type 2 Diabetes Mellitus Hemoglobin A1c 8.4 this admission. Diabetes Coordinator was consulted to assist with management throughout admission.  - Will restart home medications at discharge. Of note, it was reported that patient was taking Metformin 500mg  three times daily prior to admission. Called patient's Pharmacy and confirmed that she takes Metformin 1000mg  in the morning and 500mg  in the evening. Updated this in our system.  - Will need to follow-up with PCP.  Chronic Venous Insufficiency Stable. Continue  Torsemide 20mg  twice daily. Continue compression stockings.  Leg Pain with History of Multiple DVTs Patient complained of bilateral leg pain during admission  that made it difficult for her to sleep. Anterior legs were mildly erythematous and looked consistent with chronic venous insufficiency. However, given history of multiple DVTs, lower extremity dopplers were ordered and came back negative for DVTs.   Hypokalemia/Hypomagnesemia Potassium initially 3.1. Supplemented and 3.8 on day of discharge. Magnesium initially 1.3.  Supplemented and 1.7 on day of discharge.  - Will discharge patient on K-Dur 26mEq twice daily given she is on Torsemide.  - Needs BMET and Mg rechecked on Monday 12/20/2019 at [place].  Patient seen and examined by Dr. Margaretann Loveless today and determined to be stable for discharge. Patient will need to follow-up with primary Cardiologist at [facility] in 1-2 weeks. Medications as below.   Did the patient have an acute coronary syndrome (MI, NSTEMI, STEMI, etc) this admission?:  No                               Did the patient have a percutaneous coronary intervention (stent / angioplasty)?:  No.   _____________  Discharge Vitals Blood pressure 110/77, pulse 97, temperature 98 F (36.7 C), temperature source Oral, resp. rate 20, height 5\' 7"  (1.702 m), weight 77.1 kg, SpO2 94 %.  Filed Weights   12/15/19 1016 12/16/19 0627 12/17/19 0346  Weight: 76.2 kg 78.4 kg 77.1 kg   General: 84 y.o. female resting comfortably in no acute distress.  HEENT: Normocephalic and atraumatic. Neck: Supple. No JVD. Heart: Irregularly irregular rhythm with normal rate. Distinct S1 and S2. No murmurs, gallops, or rubs.  Lungs: No increased work of breathing. Very mild crackles in bases that improved with coughing. Otherwise, lungs clear to auscultation. Abdomen: Soft, non-distended, and non-tender to palpation.  Extremities: No lower extremity edema.    Skin: Warm and dry. Hyperpigmentation of lower extremities that looks consistent with chronic venous insufficiency.  Neuro: No focal deficits. Psych: Normal affect. Responds appropriately.   Labs & Radiologic Studies    CBC Recent Labs    12/15/19 1036 12/15/19 1036 12/16/19 0000 12/17/19 0324  WBC 6.5   < > 6.1 6.2  NEUTROABS 4.6  --   --   --   HGB 13.6   < > 12.7 13.3  HCT 41.3   < > 38.3 40.2  MCV 91.4   < > 91.0 91.8  PLT 145*   < > 148* 144*   < > = values in this interval not displayed.   Basic Metabolic Panel Recent Labs     12/15/19 1646 12/16/19 0000 12/16/19 0003 12/17/19 0324  NA  --  144  --  141  K  --  3.2*  --  3.8  CL  --  104  --  103  CO2  --  30  --  29  GLUCOSE  --  232*  --  258*  BUN  --  18  --  19  CREATININE  --  1.05*  --  1.09*  CALCIUM  --  9.2  --  8.8*  MG   < >  --  1.4* 1.7   < > = values in this interval not displayed.   Liver Function Tests Recent Labs    12/15/19 1036  AST 24  ALT 16  ALKPHOS 56  BILITOT 0.7  PROT 6.3*  ALBUMIN  3.2*   No results for input(s): LIPASE, AMYLASE in the last 72 hours. High Sensitivity Troponin:   Recent Labs  Lab 12/15/19 1036 12/15/19 1229 12/16/19 0000  TROPONINIHS 8 9 11     BNP Invalid input(s): POCBNP D-Dimer No results for input(s): DDIMER in the last 72 hours. Hemoglobin A1C Recent Labs    12/15/19 1646  HGBA1C 8.4*   Fasting Lipid Panel Recent Labs    12/16/19 0003  CHOL 100  HDL 23*  LDLCALC 57  TRIG 100  CHOLHDL 4.3   Thyroid Function Tests Recent Labs    12/15/19 1646  TSH 0.136*   _____________  DG Chest Portable 1 View  Result Date: 12/15/2019 CLINICAL DATA:  Chest pain, shortness of breath EXAM: PORTABLE CHEST 1 VIEW COMPARISON:  08/14/2018 FINDINGS: The heart size and mediastinal contours are stable. Atherosclerotic calcification of the aortic knob. Chronic increased interstitial markings throughout both lungs. No superimposed airspace consolidation. No pleural effusion or pneumothorax. IMPRESSION: Chronic bronchitic type lung changes. No superimposed airspace opacity. Electronically Signed   By: Davina Poke D.O.   On: 12/15/2019 10:45   ECHOCARDIOGRAM COMPLETE  Result Date: 12/15/2019    ECHOCARDIOGRAM REPORT   Patient Name:   Stephanie Orozco Date of Exam: 12/15/2019 Medical Rec #:  [id number]      Height:       67.0 in Accession #:    [id number]     Weight:       168.0 lb Date of Birth:  [birth year]      BSA:          1.878 m Patient Age:    36 years       BP:           165/135 mmHg Patient  Gender: F              HR:           110 bpm. Exam Location:  Inpatient Procedure: 2D Echo Indications:    Atrial fibrillation and Flutter  History:        Patient has no prior history of Echocardiogram examinations.                 Arrythmias:Atrial Fibrillation.  Sonographer:    Clayton Lefort RDCS (AE) Referring Phys: [id number] [place]  1. Left ventricular ejection fraction, by estimation, is 60 to 65%. The left ventricle has normal function. The left ventricle has no regional wall motion abnormalities. There is severe left ventricular hypertrophy. Left ventricular diastolic parameters  are indeterminate.  2. Right ventricular systolic function is normal. The right ventricular size is normal. There is normal pulmonary artery systolic pressure.  3. The mitral valve is normal in structure. Trivial mitral valve regurgitation. No evidence of mitral stenosis.  4. The aortic valve is tricuspid. Aortic valve regurgitation is trivial. Mild to moderate aortic valve sclerosis/calcification is present, without any evidence of aortic stenosis.  5. The inferior vena cava is normal in size with greater than 50% respiratory variability, suggesting right atrial pressure of 3 mmHg. FINDINGS  Left Ventricle: Left ventricular ejection fraction, by estimation, is 60 to 65%. The left ventricle has normal function. The left ventricle has no regional wall motion abnormalities. The left ventricular internal cavity size was normal in size. There is  severe left ventricular hypertrophy. Left ventricular diastolic parameters are indeterminate. Right Ventricle: The right ventricular size is normal. No increase in right ventricular wall thickness. Right ventricular systolic function is normal. There  is normal pulmonary artery systolic pressure. The tricuspid regurgitant velocity is 2.21 m/s, and  with an assumed right atrial pressure of 10 mmHg, the estimated right ventricular systolic pressure is 99991111 mmHg. Left Atrium: Left  atrial size was normal in size. Right Atrium: Right atrial size was normal in size. Pericardium: There is no evidence of pericardial effusion. Mitral Valve: The mitral valve is normal in structure. There is moderate thickening of the mitral valve leaflet(s). There is mild calcification of the mitral valve leaflet(s). Normal mobility of the mitral valve leaflets. Moderate mitral annular calcification. Trivial mitral valve regurgitation. No evidence of mitral valve stenosis. Tricuspid Valve: The tricuspid valve is normal in structure. Tricuspid valve regurgitation is mild . No evidence of tricuspid stenosis. Aortic Valve: The aortic valve is tricuspid. Aortic valve regurgitation is trivial. Aortic regurgitation PHT measures 715 msec. Mild to moderate aortic valve sclerosis/calcification is present, without any evidence of aortic stenosis. Aortic valve mean gradient measures 7.0 mmHg. Aortic valve peak gradient measures 12.5 mmHg. Aortic valve area, by VTI measures 1.30 cm. Pulmonic Valve: The pulmonic valve was normal in structure. Pulmonic valve regurgitation is trivial. No evidence of pulmonic stenosis. Aorta: The aortic root is normal in size and structure. Venous: The inferior vena cava was not well visualized. The inferior vena cava is normal in size with greater than 50% respiratory variability, suggesting right atrial pressure of 3 mmHg. IAS/Shunts: No atrial level shunt detected by color flow Doppler.  LEFT VENTRICLE PLAX 2D LVIDd:         3.70 cm LVIDs:         2.30 cm LV PW:         1.80 cm LV IVS:        2.10 cm LVOT diam:     1.80 cm LV SV:         39 LV SV Index:   21 LVOT Area:     2.54 cm  RIGHT VENTRICLE            IVC RV Basal diam:  2.90 cm    IVC diam: 1.80 cm RV S prime:     9.36 cm/s TAPSE (M-mode): 1.4 cm LEFT ATRIUM             Index       RIGHT ATRIUM           Index LA diam:        3.20 cm 1.70 cm/m  RA Area:     13.30 cm LA Vol (A2C):   68.0 ml 36.21 ml/m RA Volume:   33.50 ml  17.84  ml/m LA Vol (A4C):   37.6 ml 20.02 ml/m LA Biplane Vol: 53.8 ml 28.65 ml/m  AORTIC VALVE AV Area (Vmax):    1.18 cm AV Area (Vmean):   1.19 cm AV Area (VTI):     1.30 cm AV Vmax:           177.00 cm/s AV Vmean:          122.000 cm/s AV VTI:            0.299 m AV Peak Grad:      12.5 mmHg AV Mean Grad:      7.0 mmHg LVOT Vmax:         82.36 cm/s LVOT Vmean:        57.260 cm/s LVOT VTI:          0.153 m LVOT/AV VTI ratio: 0.51 AI PHT:  715 msec  AORTA Ao Root diam: 3.20 cm Ao Asc diam:  3.50 cm TRICUSPID VALVE TR Peak grad:   19.5 mmHg TR Vmax:        221.00 cm/s  SHUNTS Systemic VTI:  0.15 m Systemic Diam: 1.80 cm Jenkins Rouge MD Electronically signed by Jenkins Rouge MD Signature Date/Time: 12/15/2019/5:35:45 PM    Final    VAS [place] LOWER EXTREMITY VENOUS (DVT)  Result Date: 12/16/2019  Lower Venous DVTStudy Indications: Pain.  Comparison Study: no prior Performing Technologist: Abram Sander RVS  Examination Guidelines: A complete evaluation includes B-mode imaging, spectral Doppler, color Doppler, and power Doppler as needed of all accessible portions of each vessel. Bilateral testing is considered an integral part of a complete examination. Limited examinations for reoccurring indications may be performed as noted. The reflux portion of the exam is performed with the patient in reverse Trendelenburg.  +---------+---------------+---------+-----------+----------+--------------+ RIGHT    CompressibilityPhasicitySpontaneityPropertiesThrombus Aging +---------+---------------+---------+-----------+----------+--------------+ CFV      Full           Yes      Yes                                 +---------+---------------+---------+-----------+----------+--------------+ SFJ      Full                                                        +---------+---------------+---------+-----------+----------+--------------+ FV Prox  Full                                                         +---------+---------------+---------+-----------+----------+--------------+ FV Mid   Full                                                        +---------+---------------+---------+-----------+----------+--------------+ FV DistalFull                                                        +---------+---------------+---------+-----------+----------+--------------+ PFV      Full                                                        +---------+---------------+---------+-----------+----------+--------------+ POP      Full           Yes      Yes                                 +---------+---------------+---------+-----------+----------+--------------+ PTV      Full                                                        +---------+---------------+---------+-----------+----------+--------------+  PERO     Full                                                        +---------+---------------+---------+-----------+----------+--------------+   +---------+---------------+---------+-----------+----------+--------------+ LEFT     CompressibilityPhasicitySpontaneityPropertiesThrombus Aging +---------+---------------+---------+-----------+----------+--------------+ CFV      Full           Yes      Yes                                 +---------+---------------+---------+-----------+----------+--------------+ SFJ      Full                                                        +---------+---------------+---------+-----------+----------+--------------+ FV Prox  Full                                                        +---------+---------------+---------+-----------+----------+--------------+ FV Mid   Full                                                        +---------+---------------+---------+-----------+----------+--------------+ FV DistalFull                                                         +---------+---------------+---------+-----------+----------+--------------+ PFV      Full                                                        +---------+---------------+---------+-----------+----------+--------------+ POP      Full           Yes      Yes                                 +---------+---------------+---------+-----------+----------+--------------+ PTV      Full                                                        +---------+---------------+---------+-----------+----------+--------------+ PERO     Full                                                        +---------+---------------+---------+-----------+----------+--------------+  Summary: BILATERAL: - No evidence of deep vein thrombosis seen in the lower extremities, bilaterally.   *See table(s) above for measurements and observations. Electronically signed by Servando Snare MD on 12/16/2019 at 4:38:14 PM.    Final    Disposition   Patient is being discharged home today in good condition.  Follow-up Plans & Appointments    Follow-up Information    Monico Blitz, MD. Call.   Specialty: Internal Medicine Why: Please call PCP's office to schedule ASAP to schedule follow-up visit within the next 1-2 weeks.  Contact information: [address]  [place]  [address] [phone]        Cardiology. Call.   Why: Please call your Cardiologist's office ASAP to schedule a follow-up within the next 2 weeks.          Discharge Instructions    Diet - low sodium heart healthy   Complete by: As directed    Increase activity slowly   Complete by: As directed       Discharge Medications   Allergies as of 12/17/2019      Reactions   Lasix [furosemide]    Other Nausea And Vomiting   ONIONS- avoids alone, can have onion powder. MM, RD 11/03/18  AND [place]      Medication List    STOP taking these medications   simvastatin 40 MG tablet Commonly known as: ZOCOR     TAKE these medications     insulin aspart 100 UNIT/ML FlexPen Commonly known as: NOVOLOG Inject 1-10 Units into the skin 4 (four) times daily -  before meals and at bedtime. Per sliding scale What changed: Another medication with the same name was removed. Continue taking this medication, and follow the directions you see here.   isosorbide dinitrate 30 MG tablet Commonly known as: ISORDIL Take 1 tablet (30 mg total) by mouth 2 (two) times daily. What changed: when to take this   metFORMIN 500 MG tablet Commonly known as: GLUCOPHAGE Take 2 tablets (1000 mg) in the morning and 1 tablet (500 mg) in the evening. What changed:   how much to take  how to take this  when to take this  additional instructions   metoprolol tartrate 50 MG tablet Commonly known as: LOPRESSOR Take 1 tablet (50 mg total) by mouth 2 (two) times daily.   omeprazole 20 MG capsule Commonly known as: PRILOSEC Take 20 mg by mouth daily.   Potassium Chloride ER 20 MEQ Tbcr Take 20 mEq by mouth 2 (two) times daily.   rosuvastatin 10 MG tablet Commonly known as: CRESTOR Take 10 mg by mouth at bedtime.   torsemide 20 MG tablet Commonly known as: DEMADEX Take 20 mg by mouth 2 (two) times daily.   Toujeo SoloStar 300 UNIT/ML Sopn Generic drug: Insulin Glargine Inject 30 Units into the skin daily.   Toviaz 8 MG Tb24 tablet Generic drug: fesoterodine Take 8 mg by mouth daily.   warfarin 5 MG tablet Commonly known as: COUMADIN Take 1 tablet (5 mg total) by mouth daily. Start taking on: December 18, 2019 What changed:   medication strength  how much to take          Outstanding Labs/Studies   Needs BMET, Mg, CBC, and INR checked at [facility] on Monday 12/20/2019.   Duration of Discharge Encounter   Greater than 30 minutes including physician time.  Signed, Darreld Mclean, PA-C 12/17/2019, 1:22 PM   Patient seen and examined with Sande Rives PA-C.  Agree as above, with the following exceptions  and changes as noted below. Feeling back to baseline, continues to deal with situational stressors. Gen: NAD, CV: iRRR, no murmurs, Lungs: clear, Abd: soft, Extrem: Warm, well perfused, no edema, Neuro/Psych: alert and oriented x 3, normal mood and affect. All available labs, radiology testing, previous records reviewed. Stable for discharge, recommendations as above.  Elouise Munroe, MD

## 2019-12-17 NOTE — TOC Transition Note (Signed)
Transition of Care [facility]) - CM/SW Discharge Note   Patient Details  Name: Stephanie Orozco MRN: [id number] Date of Birth: [birth year]  Transition of Care [facility]) CM/SW Contact:  Jacquelynn Cree Phone Number: 12/17/2019, 4:02 PM   Clinical Narrative:    Patient will DC to: [facility] Anticipated DC date: 12/17/2019 Family notified: Edd Fabian, friend Transport by: Corey Harold   Per MD patient ready for DC to [place]. RN, patient, patient's family, and facility notified of DC. Discharge Summary and FL2 sent to facility.  DC packet on chart. Ambulance transport requested for patient.   CSW will sign off for now as social work intervention is no longer needed. Please consult [place] again if new needs arise.    Final next level of care: Assisted Living Barriers to Discharge: No Barriers Identified   Patient Goals and CMS Choice   CMS Medicare.gov Compare Post Acute Care list provided to:: Patient Choice offered to / list presented to : Patient  Discharge Placement              Patient chooses bed at: Other - please specify in the comment section below:([facility] [place]) Patient to be transferred to facility by: [facility] Name of family member notified: Edd Fabian, friend Patient and family notified of of transfer: 12/17/19  Discharge Plan and Services                                     Social Determinants of Health (SDOH) Interventions     Readmission Risk Interventions No flowsheet data found.

## 2019-12-19 LAB — URINE CULTURE: Culture: >=100000 — AB

## 2019-12-20 DIAGNOSIS — E876 Hypokalemia: Secondary | ICD-10-CM | POA: Diagnosis not present

## 2019-12-20 DIAGNOSIS — D649 Anemia, unspecified: Secondary | ICD-10-CM | POA: Diagnosis not present

## 2019-12-20 DIAGNOSIS — Z79899 Other long term (current) drug therapy: Secondary | ICD-10-CM | POA: Diagnosis not present

## 2019-12-20 DIAGNOSIS — I482 Chronic atrial fibrillation, unspecified: Secondary | ICD-10-CM | POA: Diagnosis not present

## 2019-12-30 DIAGNOSIS — I4891 Unspecified atrial fibrillation: Secondary | ICD-10-CM | POA: Diagnosis not present

## 2019-12-30 DIAGNOSIS — R791 Abnormal coagulation profile: Secondary | ICD-10-CM | POA: Diagnosis not present

## 2020-01-11 DIAGNOSIS — I872 Venous insufficiency (chronic) (peripheral): Secondary | ICD-10-CM | POA: Diagnosis not present

## 2020-01-11 DIAGNOSIS — I10 Essential (primary) hypertension: Secondary | ICD-10-CM | POA: Diagnosis not present

## 2020-01-11 DIAGNOSIS — Z86718 Personal history of other venous thrombosis and embolism: Secondary | ICD-10-CM | POA: Diagnosis not present

## 2020-01-11 DIAGNOSIS — Z7901 Long term (current) use of anticoagulants: Secondary | ICD-10-CM | POA: Diagnosis not present

## 2020-01-11 DIAGNOSIS — R600 Localized edema: Secondary | ICD-10-CM | POA: Diagnosis not present

## 2020-01-11 DIAGNOSIS — I2510 Atherosclerotic heart disease of native coronary artery without angina pectoris: Secondary | ICD-10-CM | POA: Diagnosis not present

## 2020-01-11 DIAGNOSIS — E785 Hyperlipidemia, unspecified: Secondary | ICD-10-CM | POA: Diagnosis not present

## 2020-01-11 DIAGNOSIS — I5032 Chronic diastolic (congestive) heart failure: Secondary | ICD-10-CM | POA: Diagnosis not present

## 2020-01-11 DIAGNOSIS — I110 Hypertensive heart disease with heart failure: Secondary | ICD-10-CM | POA: Diagnosis not present

## 2020-01-11 DIAGNOSIS — Z79899 Other long term (current) drug therapy: Secondary | ICD-10-CM | POA: Diagnosis not present

## 2020-01-11 DIAGNOSIS — I4819 Other persistent atrial fibrillation: Secondary | ICD-10-CM | POA: Diagnosis not present

## 2020-01-13 DIAGNOSIS — I4891 Unspecified atrial fibrillation: Secondary | ICD-10-CM | POA: Diagnosis not present

## 2020-01-13 DIAGNOSIS — R791 Abnormal coagulation profile: Secondary | ICD-10-CM | POA: Diagnosis not present

## 2020-01-16 DIAGNOSIS — I872 Venous insufficiency (chronic) (peripheral): Secondary | ICD-10-CM | POA: Diagnosis not present

## 2020-01-16 DIAGNOSIS — E785 Hyperlipidemia, unspecified: Secondary | ICD-10-CM | POA: Diagnosis not present

## 2020-01-16 DIAGNOSIS — I10 Essential (primary) hypertension: Secondary | ICD-10-CM | POA: Diagnosis not present

## 2020-01-16 DIAGNOSIS — R609 Edema, unspecified: Secondary | ICD-10-CM | POA: Diagnosis not present

## 2020-01-16 DIAGNOSIS — Z86718 Personal history of other venous thrombosis and embolism: Secondary | ICD-10-CM | POA: Diagnosis not present

## 2020-01-16 DIAGNOSIS — E119 Type 2 diabetes mellitus without complications: Secondary | ICD-10-CM | POA: Diagnosis not present

## 2020-01-16 DIAGNOSIS — Z90710 Acquired absence of both cervix and uterus: Secondary | ICD-10-CM | POA: Diagnosis not present

## 2020-01-16 DIAGNOSIS — I252 Old myocardial infarction: Secondary | ICD-10-CM | POA: Diagnosis not present

## 2020-01-16 DIAGNOSIS — Z9049 Acquired absence of other specified parts of digestive tract: Secondary | ICD-10-CM | POA: Diagnosis not present

## 2020-01-16 DIAGNOSIS — J984 Other disorders of lung: Secondary | ICD-10-CM | POA: Diagnosis not present

## 2020-01-25 DIAGNOSIS — Z09 Encounter for follow-up examination after completed treatment for conditions other than malignant neoplasm: Secondary | ICD-10-CM | POA: Diagnosis not present

## 2020-01-25 DIAGNOSIS — R2243 Localized swelling, mass and lump, lower limb, bilateral: Secondary | ICD-10-CM | POA: Diagnosis not present

## 2020-01-27 DIAGNOSIS — Z299 Encounter for prophylactic measures, unspecified: Secondary | ICD-10-CM | POA: Diagnosis not present

## 2020-01-27 DIAGNOSIS — I482 Chronic atrial fibrillation, unspecified: Secondary | ICD-10-CM | POA: Diagnosis not present

## 2020-01-27 DIAGNOSIS — Z789 Other specified health status: Secondary | ICD-10-CM | POA: Diagnosis not present

## 2020-01-27 DIAGNOSIS — I152 Hypertension secondary to endocrine disorders: Secondary | ICD-10-CM | POA: Diagnosis not present

## 2020-01-27 DIAGNOSIS — D649 Anemia, unspecified: Secondary | ICD-10-CM | POA: Diagnosis not present

## 2020-01-27 DIAGNOSIS — R600 Localized edema: Secondary | ICD-10-CM | POA: Diagnosis not present

## 2020-01-27 DIAGNOSIS — E1159 Type 2 diabetes mellitus with other circulatory complications: Secondary | ICD-10-CM | POA: Diagnosis not present

## 2020-01-27 DIAGNOSIS — I10 Essential (primary) hypertension: Secondary | ICD-10-CM | POA: Diagnosis not present

## 2020-01-27 DIAGNOSIS — Z79899 Other long term (current) drug therapy: Secondary | ICD-10-CM | POA: Diagnosis not present

## 2020-01-28 DIAGNOSIS — R230 Cyanosis: Secondary | ICD-10-CM | POA: Diagnosis not present

## 2020-01-28 DIAGNOSIS — R2243 Localized swelling, mass and lump, lower limb, bilateral: Secondary | ICD-10-CM | POA: Diagnosis not present

## 2020-01-30 DIAGNOSIS — S81812A Laceration without foreign body, left lower leg, initial encounter: Secondary | ICD-10-CM | POA: Diagnosis not present

## 2020-01-30 DIAGNOSIS — Z888 Allergy status to other drugs, medicaments and biological substances status: Secondary | ICD-10-CM | POA: Diagnosis not present

## 2020-01-30 DIAGNOSIS — I509 Heart failure, unspecified: Secondary | ICD-10-CM | POA: Diagnosis not present

## 2020-01-30 DIAGNOSIS — Y92099 Unspecified place in other non-institutional residence as the place of occurrence of the external cause: Secondary | ICD-10-CM | POA: Diagnosis not present

## 2020-01-30 DIAGNOSIS — R58 Hemorrhage, not elsewhere classified: Secondary | ICD-10-CM | POA: Diagnosis not present

## 2020-01-30 DIAGNOSIS — E119 Type 2 diabetes mellitus without complications: Secondary | ICD-10-CM | POA: Diagnosis not present

## 2020-01-30 DIAGNOSIS — W1830XA Fall on same level, unspecified, initial encounter: Secondary | ICD-10-CM | POA: Diagnosis not present

## 2020-01-30 DIAGNOSIS — I110 Hypertensive heart disease with heart failure: Secondary | ICD-10-CM | POA: Diagnosis not present

## 2020-01-30 DIAGNOSIS — Z794 Long term (current) use of insulin: Secondary | ICD-10-CM | POA: Diagnosis not present

## 2020-01-30 DIAGNOSIS — S81819A Laceration without foreign body, unspecified lower leg, initial encounter: Secondary | ICD-10-CM | POA: Diagnosis not present

## 2020-02-01 DIAGNOSIS — R7989 Other specified abnormal findings of blood chemistry: Secondary | ICD-10-CM | POA: Diagnosis not present

## 2020-02-01 DIAGNOSIS — D649 Anemia, unspecified: Secondary | ICD-10-CM | POA: Diagnosis not present

## 2020-02-02 DIAGNOSIS — R197 Diarrhea, unspecified: Secondary | ICD-10-CM | POA: Diagnosis not present

## 2020-02-02 DIAGNOSIS — S81802S Unspecified open wound, left lower leg, sequela: Secondary | ICD-10-CM | POA: Diagnosis not present

## 2020-02-02 DIAGNOSIS — E119 Type 2 diabetes mellitus without complications: Secondary | ICD-10-CM | POA: Diagnosis not present

## 2020-02-02 DIAGNOSIS — E7849 Other hyperlipidemia: Secondary | ICD-10-CM | POA: Diagnosis not present

## 2020-02-02 DIAGNOSIS — R194 Change in bowel habit: Secondary | ICD-10-CM | POA: Diagnosis not present

## 2020-02-02 DIAGNOSIS — E876 Hypokalemia: Secondary | ICD-10-CM | POA: Diagnosis not present

## 2020-02-02 DIAGNOSIS — I10 Essential (primary) hypertension: Secondary | ICD-10-CM | POA: Diagnosis not present

## 2020-02-02 DIAGNOSIS — Z794 Long term (current) use of insulin: Secondary | ICD-10-CM | POA: Diagnosis not present

## 2020-02-03 DIAGNOSIS — E785 Hyperlipidemia, unspecified: Secondary | ICD-10-CM | POA: Diagnosis not present

## 2020-02-03 DIAGNOSIS — D519 Vitamin B12 deficiency anemia, unspecified: Secondary | ICD-10-CM | POA: Diagnosis not present

## 2020-02-03 DIAGNOSIS — Z79899 Other long term (current) drug therapy: Secondary | ICD-10-CM | POA: Diagnosis not present

## 2020-02-03 DIAGNOSIS — E119 Type 2 diabetes mellitus without complications: Secondary | ICD-10-CM | POA: Diagnosis not present

## 2020-02-03 DIAGNOSIS — E559 Vitamin D deficiency, unspecified: Secondary | ICD-10-CM | POA: Diagnosis not present

## 2020-02-03 DIAGNOSIS — E039 Hypothyroidism, unspecified: Secondary | ICD-10-CM | POA: Diagnosis not present

## 2020-02-03 DIAGNOSIS — D649 Anemia, unspecified: Secondary | ICD-10-CM | POA: Diagnosis not present

## 2020-02-08 DIAGNOSIS — I10 Essential (primary) hypertension: Secondary | ICD-10-CM | POA: Diagnosis not present

## 2020-02-08 DIAGNOSIS — I509 Heart failure, unspecified: Secondary | ICD-10-CM | POA: Diagnosis not present

## 2020-02-08 DIAGNOSIS — E119 Type 2 diabetes mellitus without complications: Secondary | ICD-10-CM | POA: Diagnosis not present

## 2020-02-08 DIAGNOSIS — E7849 Other hyperlipidemia: Secondary | ICD-10-CM | POA: Diagnosis not present

## 2020-02-08 DIAGNOSIS — M6259 Muscle wasting and atrophy, not elsewhere classified, multiple sites: Secondary | ICD-10-CM | POA: Diagnosis not present

## 2020-02-08 DIAGNOSIS — I8290 Acute embolism and thrombosis of unspecified vein: Secondary | ICD-10-CM | POA: Diagnosis not present

## 2020-02-08 DIAGNOSIS — S81802D Unspecified open wound, left lower leg, subsequent encounter: Secondary | ICD-10-CM | POA: Diagnosis not present

## 2020-02-08 DIAGNOSIS — I872 Venous insufficiency (chronic) (peripheral): Secondary | ICD-10-CM | POA: Diagnosis not present

## 2020-02-08 DIAGNOSIS — I4891 Unspecified atrial fibrillation: Secondary | ICD-10-CM | POA: Diagnosis not present

## 2020-02-11 DIAGNOSIS — I872 Venous insufficiency (chronic) (peripheral): Secondary | ICD-10-CM | POA: Diagnosis not present

## 2020-02-11 DIAGNOSIS — I4891 Unspecified atrial fibrillation: Secondary | ICD-10-CM | POA: Diagnosis not present

## 2020-02-11 DIAGNOSIS — I10 Essential (primary) hypertension: Secondary | ICD-10-CM | POA: Diagnosis not present

## 2020-02-11 DIAGNOSIS — E7849 Other hyperlipidemia: Secondary | ICD-10-CM | POA: Diagnosis not present

## 2020-02-11 DIAGNOSIS — I8290 Acute embolism and thrombosis of unspecified vein: Secondary | ICD-10-CM | POA: Diagnosis not present

## 2020-02-11 DIAGNOSIS — M6259 Muscle wasting and atrophy, not elsewhere classified, multiple sites: Secondary | ICD-10-CM | POA: Diagnosis not present

## 2020-02-11 DIAGNOSIS — S81802D Unspecified open wound, left lower leg, subsequent encounter: Secondary | ICD-10-CM | POA: Diagnosis not present

## 2020-02-11 DIAGNOSIS — E119 Type 2 diabetes mellitus without complications: Secondary | ICD-10-CM | POA: Diagnosis not present

## 2020-02-11 DIAGNOSIS — I509 Heart failure, unspecified: Secondary | ICD-10-CM | POA: Diagnosis not present

## 2020-02-15 DIAGNOSIS — E119 Type 2 diabetes mellitus without complications: Secondary | ICD-10-CM | POA: Diagnosis not present

## 2020-02-15 DIAGNOSIS — I10 Essential (primary) hypertension: Secondary | ICD-10-CM | POA: Diagnosis not present

## 2020-02-15 DIAGNOSIS — M6259 Muscle wasting and atrophy, not elsewhere classified, multiple sites: Secondary | ICD-10-CM | POA: Diagnosis not present

## 2020-02-15 DIAGNOSIS — S81802D Unspecified open wound, left lower leg, subsequent encounter: Secondary | ICD-10-CM | POA: Diagnosis not present

## 2020-02-15 DIAGNOSIS — I872 Venous insufficiency (chronic) (peripheral): Secondary | ICD-10-CM | POA: Diagnosis not present

## 2020-02-15 DIAGNOSIS — I8290 Acute embolism and thrombosis of unspecified vein: Secondary | ICD-10-CM | POA: Diagnosis not present

## 2020-02-15 DIAGNOSIS — I4891 Unspecified atrial fibrillation: Secondary | ICD-10-CM | POA: Diagnosis not present

## 2020-02-15 DIAGNOSIS — E7849 Other hyperlipidemia: Secondary | ICD-10-CM | POA: Diagnosis not present

## 2020-02-15 DIAGNOSIS — I509 Heart failure, unspecified: Secondary | ICD-10-CM | POA: Diagnosis not present

## 2020-02-16 DIAGNOSIS — R194 Change in bowel habit: Secondary | ICD-10-CM | POA: Diagnosis not present

## 2020-02-16 DIAGNOSIS — K219 Gastro-esophageal reflux disease without esophagitis: Secondary | ICD-10-CM | POA: Diagnosis not present

## 2020-02-16 DIAGNOSIS — I2510 Atherosclerotic heart disease of native coronary artery without angina pectoris: Secondary | ICD-10-CM | POA: Diagnosis not present

## 2020-02-16 DIAGNOSIS — R197 Diarrhea, unspecified: Secondary | ICD-10-CM | POA: Diagnosis not present

## 2020-02-16 DIAGNOSIS — I4891 Unspecified atrial fibrillation: Secondary | ICD-10-CM | POA: Diagnosis not present

## 2020-02-17 DIAGNOSIS — I482 Chronic atrial fibrillation, unspecified: Secondary | ICD-10-CM | POA: Diagnosis not present

## 2020-02-18 DIAGNOSIS — I4891 Unspecified atrial fibrillation: Secondary | ICD-10-CM | POA: Diagnosis not present

## 2020-02-18 DIAGNOSIS — I509 Heart failure, unspecified: Secondary | ICD-10-CM | POA: Diagnosis not present

## 2020-02-18 DIAGNOSIS — S81802D Unspecified open wound, left lower leg, subsequent encounter: Secondary | ICD-10-CM | POA: Diagnosis not present

## 2020-02-18 DIAGNOSIS — M6259 Muscle wasting and atrophy, not elsewhere classified, multiple sites: Secondary | ICD-10-CM | POA: Diagnosis not present

## 2020-02-18 DIAGNOSIS — E119 Type 2 diabetes mellitus without complications: Secondary | ICD-10-CM | POA: Diagnosis not present

## 2020-02-18 DIAGNOSIS — I8290 Acute embolism and thrombosis of unspecified vein: Secondary | ICD-10-CM | POA: Diagnosis not present

## 2020-02-18 DIAGNOSIS — I872 Venous insufficiency (chronic) (peripheral): Secondary | ICD-10-CM | POA: Diagnosis not present

## 2020-02-18 DIAGNOSIS — E7849 Other hyperlipidemia: Secondary | ICD-10-CM | POA: Diagnosis not present

## 2020-02-18 DIAGNOSIS — I10 Essential (primary) hypertension: Secondary | ICD-10-CM | POA: Diagnosis not present

## 2020-02-22 DIAGNOSIS — E7849 Other hyperlipidemia: Secondary | ICD-10-CM | POA: Diagnosis not present

## 2020-02-22 DIAGNOSIS — I4891 Unspecified atrial fibrillation: Secondary | ICD-10-CM | POA: Diagnosis not present

## 2020-02-22 DIAGNOSIS — I872 Venous insufficiency (chronic) (peripheral): Secondary | ICD-10-CM | POA: Diagnosis not present

## 2020-02-22 DIAGNOSIS — I10 Essential (primary) hypertension: Secondary | ICD-10-CM | POA: Diagnosis not present

## 2020-02-22 DIAGNOSIS — I509 Heart failure, unspecified: Secondary | ICD-10-CM | POA: Diagnosis not present

## 2020-02-22 DIAGNOSIS — E119 Type 2 diabetes mellitus without complications: Secondary | ICD-10-CM | POA: Diagnosis not present

## 2020-02-22 DIAGNOSIS — M6259 Muscle wasting and atrophy, not elsewhere classified, multiple sites: Secondary | ICD-10-CM | POA: Diagnosis not present

## 2020-02-22 DIAGNOSIS — S81802D Unspecified open wound, left lower leg, subsequent encounter: Secondary | ICD-10-CM | POA: Diagnosis not present

## 2020-02-22 DIAGNOSIS — I8290 Acute embolism and thrombosis of unspecified vein: Secondary | ICD-10-CM | POA: Diagnosis not present

## 2020-02-24 DIAGNOSIS — E039 Hypothyroidism, unspecified: Secondary | ICD-10-CM | POA: Diagnosis not present

## 2020-02-24 DIAGNOSIS — I482 Chronic atrial fibrillation, unspecified: Secondary | ICD-10-CM | POA: Diagnosis not present

## 2020-02-24 DIAGNOSIS — E876 Hypokalemia: Secondary | ICD-10-CM | POA: Diagnosis not present

## 2020-02-24 DIAGNOSIS — E119 Type 2 diabetes mellitus without complications: Secondary | ICD-10-CM | POA: Diagnosis not present

## 2020-02-24 DIAGNOSIS — E309 Disorder of puberty, unspecified: Secondary | ICD-10-CM | POA: Diagnosis not present

## 2020-02-25 DIAGNOSIS — E119 Type 2 diabetes mellitus without complications: Secondary | ICD-10-CM | POA: Diagnosis not present

## 2020-02-25 DIAGNOSIS — I509 Heart failure, unspecified: Secondary | ICD-10-CM | POA: Diagnosis not present

## 2020-02-25 DIAGNOSIS — I872 Venous insufficiency (chronic) (peripheral): Secondary | ICD-10-CM | POA: Diagnosis not present

## 2020-02-25 DIAGNOSIS — I8290 Acute embolism and thrombosis of unspecified vein: Secondary | ICD-10-CM | POA: Diagnosis not present

## 2020-02-25 DIAGNOSIS — I10 Essential (primary) hypertension: Secondary | ICD-10-CM | POA: Diagnosis not present

## 2020-02-25 DIAGNOSIS — I4891 Unspecified atrial fibrillation: Secondary | ICD-10-CM | POA: Diagnosis not present

## 2020-02-25 DIAGNOSIS — S81802D Unspecified open wound, left lower leg, subsequent encounter: Secondary | ICD-10-CM | POA: Diagnosis not present

## 2020-02-25 DIAGNOSIS — E7849 Other hyperlipidemia: Secondary | ICD-10-CM | POA: Diagnosis not present

## 2020-02-25 DIAGNOSIS — M6259 Muscle wasting and atrophy, not elsewhere classified, multiple sites: Secondary | ICD-10-CM | POA: Diagnosis not present

## 2020-02-29 DIAGNOSIS — I509 Heart failure, unspecified: Secondary | ICD-10-CM | POA: Diagnosis not present

## 2020-02-29 DIAGNOSIS — I872 Venous insufficiency (chronic) (peripheral): Secondary | ICD-10-CM | POA: Diagnosis not present

## 2020-02-29 DIAGNOSIS — I8290 Acute embolism and thrombosis of unspecified vein: Secondary | ICD-10-CM | POA: Diagnosis not present

## 2020-02-29 DIAGNOSIS — E7849 Other hyperlipidemia: Secondary | ICD-10-CM | POA: Diagnosis not present

## 2020-02-29 DIAGNOSIS — S81802D Unspecified open wound, left lower leg, subsequent encounter: Secondary | ICD-10-CM | POA: Diagnosis not present

## 2020-02-29 DIAGNOSIS — I4891 Unspecified atrial fibrillation: Secondary | ICD-10-CM | POA: Diagnosis not present

## 2020-02-29 DIAGNOSIS — M6259 Muscle wasting and atrophy, not elsewhere classified, multiple sites: Secondary | ICD-10-CM | POA: Diagnosis not present

## 2020-02-29 DIAGNOSIS — E119 Type 2 diabetes mellitus without complications: Secondary | ICD-10-CM | POA: Diagnosis not present

## 2020-02-29 DIAGNOSIS — I10 Essential (primary) hypertension: Secondary | ICD-10-CM | POA: Diagnosis not present

## 2020-03-02 DIAGNOSIS — E042 Nontoxic multinodular goiter: Secondary | ICD-10-CM | POA: Diagnosis not present

## 2020-03-02 DIAGNOSIS — R946 Abnormal results of thyroid function studies: Secondary | ICD-10-CM | POA: Diagnosis not present

## 2020-03-03 DIAGNOSIS — I8290 Acute embolism and thrombosis of unspecified vein: Secondary | ICD-10-CM | POA: Diagnosis not present

## 2020-03-03 DIAGNOSIS — I509 Heart failure, unspecified: Secondary | ICD-10-CM | POA: Diagnosis not present

## 2020-03-03 DIAGNOSIS — I10 Essential (primary) hypertension: Secondary | ICD-10-CM | POA: Diagnosis not present

## 2020-03-03 DIAGNOSIS — M6259 Muscle wasting and atrophy, not elsewhere classified, multiple sites: Secondary | ICD-10-CM | POA: Diagnosis not present

## 2020-03-03 DIAGNOSIS — S81802D Unspecified open wound, left lower leg, subsequent encounter: Secondary | ICD-10-CM | POA: Diagnosis not present

## 2020-03-03 DIAGNOSIS — E7849 Other hyperlipidemia: Secondary | ICD-10-CM | POA: Diagnosis not present

## 2020-03-03 DIAGNOSIS — E119 Type 2 diabetes mellitus without complications: Secondary | ICD-10-CM | POA: Diagnosis not present

## 2020-03-03 DIAGNOSIS — I4891 Unspecified atrial fibrillation: Secondary | ICD-10-CM | POA: Diagnosis not present

## 2020-03-03 DIAGNOSIS — I872 Venous insufficiency (chronic) (peripheral): Secondary | ICD-10-CM | POA: Diagnosis not present

## 2020-03-07 DIAGNOSIS — I872 Venous insufficiency (chronic) (peripheral): Secondary | ICD-10-CM | POA: Diagnosis not present

## 2020-03-07 DIAGNOSIS — S81802D Unspecified open wound, left lower leg, subsequent encounter: Secondary | ICD-10-CM | POA: Diagnosis not present

## 2020-03-07 DIAGNOSIS — I4891 Unspecified atrial fibrillation: Secondary | ICD-10-CM | POA: Diagnosis not present

## 2020-03-07 DIAGNOSIS — E7849 Other hyperlipidemia: Secondary | ICD-10-CM | POA: Diagnosis not present

## 2020-03-07 DIAGNOSIS — M6259 Muscle wasting and atrophy, not elsewhere classified, multiple sites: Secondary | ICD-10-CM | POA: Diagnosis not present

## 2020-03-07 DIAGNOSIS — E119 Type 2 diabetes mellitus without complications: Secondary | ICD-10-CM | POA: Diagnosis not present

## 2020-03-07 DIAGNOSIS — I10 Essential (primary) hypertension: Secondary | ICD-10-CM | POA: Diagnosis not present

## 2020-03-07 DIAGNOSIS — I8290 Acute embolism and thrombosis of unspecified vein: Secondary | ICD-10-CM | POA: Diagnosis not present

## 2020-03-07 DIAGNOSIS — I509 Heart failure, unspecified: Secondary | ICD-10-CM | POA: Diagnosis not present

## 2020-03-09 DIAGNOSIS — Z794 Long term (current) use of insulin: Secondary | ICD-10-CM | POA: Diagnosis not present

## 2020-03-09 DIAGNOSIS — S81802D Unspecified open wound, left lower leg, subsequent encounter: Secondary | ICD-10-CM | POA: Diagnosis not present

## 2020-03-09 DIAGNOSIS — R197 Diarrhea, unspecified: Secondary | ICD-10-CM | POA: Diagnosis not present

## 2020-03-09 DIAGNOSIS — I509 Heart failure, unspecified: Secondary | ICD-10-CM | POA: Diagnosis not present

## 2020-03-09 DIAGNOSIS — E7849 Other hyperlipidemia: Secondary | ICD-10-CM | POA: Diagnosis not present

## 2020-03-09 DIAGNOSIS — E119 Type 2 diabetes mellitus without complications: Secondary | ICD-10-CM | POA: Diagnosis not present

## 2020-03-09 DIAGNOSIS — I10 Essential (primary) hypertension: Secondary | ICD-10-CM | POA: Diagnosis not present

## 2020-03-09 DIAGNOSIS — I4891 Unspecified atrial fibrillation: Secondary | ICD-10-CM | POA: Diagnosis not present

## 2020-03-09 DIAGNOSIS — L03116 Cellulitis of left lower limb: Secondary | ICD-10-CM | POA: Diagnosis not present

## 2020-03-09 DIAGNOSIS — I872 Venous insufficiency (chronic) (peripheral): Secondary | ICD-10-CM | POA: Diagnosis not present

## 2020-03-09 DIAGNOSIS — I8290 Acute embolism and thrombosis of unspecified vein: Secondary | ICD-10-CM | POA: Diagnosis not present

## 2020-03-09 DIAGNOSIS — M6259 Muscle wasting and atrophy, not elsewhere classified, multiple sites: Secondary | ICD-10-CM | POA: Diagnosis not present

## 2020-03-10 DIAGNOSIS — E119 Type 2 diabetes mellitus without complications: Secondary | ICD-10-CM | POA: Diagnosis not present

## 2020-03-10 DIAGNOSIS — S81802D Unspecified open wound, left lower leg, subsequent encounter: Secondary | ICD-10-CM | POA: Diagnosis not present

## 2020-03-10 DIAGNOSIS — I10 Essential (primary) hypertension: Secondary | ICD-10-CM | POA: Diagnosis not present

## 2020-03-10 DIAGNOSIS — E7849 Other hyperlipidemia: Secondary | ICD-10-CM | POA: Diagnosis not present

## 2020-03-10 DIAGNOSIS — I4891 Unspecified atrial fibrillation: Secondary | ICD-10-CM | POA: Diagnosis not present

## 2020-03-10 DIAGNOSIS — I509 Heart failure, unspecified: Secondary | ICD-10-CM | POA: Diagnosis not present

## 2020-03-10 DIAGNOSIS — M6259 Muscle wasting and atrophy, not elsewhere classified, multiple sites: Secondary | ICD-10-CM | POA: Diagnosis not present

## 2020-03-10 DIAGNOSIS — I8290 Acute embolism and thrombosis of unspecified vein: Secondary | ICD-10-CM | POA: Diagnosis not present

## 2020-03-10 DIAGNOSIS — I872 Venous insufficiency (chronic) (peripheral): Secondary | ICD-10-CM | POA: Diagnosis not present

## 2020-03-14 DIAGNOSIS — B351 Tinea unguium: Secondary | ICD-10-CM | POA: Diagnosis not present

## 2020-03-14 DIAGNOSIS — E1151 Type 2 diabetes mellitus with diabetic peripheral angiopathy without gangrene: Secondary | ICD-10-CM | POA: Diagnosis not present

## 2020-03-14 DIAGNOSIS — M79676 Pain in unspecified toe(s): Secondary | ICD-10-CM | POA: Diagnosis not present

## 2020-03-14 DIAGNOSIS — L84 Corns and callosities: Secondary | ICD-10-CM | POA: Diagnosis not present

## 2020-03-17 DIAGNOSIS — E119 Type 2 diabetes mellitus without complications: Secondary | ICD-10-CM | POA: Diagnosis not present

## 2020-03-17 DIAGNOSIS — I872 Venous insufficiency (chronic) (peripheral): Secondary | ICD-10-CM | POA: Diagnosis not present

## 2020-03-17 DIAGNOSIS — S81802D Unspecified open wound, left lower leg, subsequent encounter: Secondary | ICD-10-CM | POA: Diagnosis not present

## 2020-03-17 DIAGNOSIS — I509 Heart failure, unspecified: Secondary | ICD-10-CM | POA: Diagnosis not present

## 2020-03-17 DIAGNOSIS — I4891 Unspecified atrial fibrillation: Secondary | ICD-10-CM | POA: Diagnosis not present

## 2020-03-17 DIAGNOSIS — I8290 Acute embolism and thrombosis of unspecified vein: Secondary | ICD-10-CM | POA: Diagnosis not present

## 2020-03-17 DIAGNOSIS — I10 Essential (primary) hypertension: Secondary | ICD-10-CM | POA: Diagnosis not present

## 2020-03-17 DIAGNOSIS — E7849 Other hyperlipidemia: Secondary | ICD-10-CM | POA: Diagnosis not present

## 2020-03-17 DIAGNOSIS — M6259 Muscle wasting and atrophy, not elsewhere classified, multiple sites: Secondary | ICD-10-CM | POA: Diagnosis not present

## 2020-03-20 DIAGNOSIS — R194 Change in bowel habit: Secondary | ICD-10-CM | POA: Diagnosis not present

## 2020-03-20 DIAGNOSIS — R195 Other fecal abnormalities: Secondary | ICD-10-CM | POA: Diagnosis not present

## 2020-03-20 DIAGNOSIS — R197 Diarrhea, unspecified: Secondary | ICD-10-CM | POA: Diagnosis not present

## 2020-03-20 DIAGNOSIS — K219 Gastro-esophageal reflux disease without esophagitis: Secondary | ICD-10-CM | POA: Diagnosis not present

## 2020-03-21 DIAGNOSIS — I10 Essential (primary) hypertension: Secondary | ICD-10-CM | POA: Diagnosis not present

## 2020-03-21 DIAGNOSIS — M6259 Muscle wasting and atrophy, not elsewhere classified, multiple sites: Secondary | ICD-10-CM | POA: Diagnosis not present

## 2020-03-21 DIAGNOSIS — I8290 Acute embolism and thrombosis of unspecified vein: Secondary | ICD-10-CM | POA: Diagnosis not present

## 2020-03-21 DIAGNOSIS — I4891 Unspecified atrial fibrillation: Secondary | ICD-10-CM | POA: Diagnosis not present

## 2020-03-21 DIAGNOSIS — I509 Heart failure, unspecified: Secondary | ICD-10-CM | POA: Diagnosis not present

## 2020-03-21 DIAGNOSIS — S81802D Unspecified open wound, left lower leg, subsequent encounter: Secondary | ICD-10-CM | POA: Diagnosis not present

## 2020-03-21 DIAGNOSIS — I872 Venous insufficiency (chronic) (peripheral): Secondary | ICD-10-CM | POA: Diagnosis not present

## 2020-03-21 DIAGNOSIS — E7849 Other hyperlipidemia: Secondary | ICD-10-CM | POA: Diagnosis not present

## 2020-03-21 DIAGNOSIS — E119 Type 2 diabetes mellitus without complications: Secondary | ICD-10-CM | POA: Diagnosis not present

## 2020-03-23 DIAGNOSIS — I4891 Unspecified atrial fibrillation: Secondary | ICD-10-CM | POA: Diagnosis not present

## 2020-03-23 DIAGNOSIS — R791 Abnormal coagulation profile: Secondary | ICD-10-CM | POA: Diagnosis not present

## 2020-03-28 DIAGNOSIS — I509 Heart failure, unspecified: Secondary | ICD-10-CM | POA: Diagnosis not present

## 2020-03-28 DIAGNOSIS — E7849 Other hyperlipidemia: Secondary | ICD-10-CM | POA: Diagnosis not present

## 2020-03-28 DIAGNOSIS — I872 Venous insufficiency (chronic) (peripheral): Secondary | ICD-10-CM | POA: Diagnosis not present

## 2020-03-28 DIAGNOSIS — I8290 Acute embolism and thrombosis of unspecified vein: Secondary | ICD-10-CM | POA: Diagnosis not present

## 2020-03-28 DIAGNOSIS — E119 Type 2 diabetes mellitus without complications: Secondary | ICD-10-CM | POA: Diagnosis not present

## 2020-03-28 DIAGNOSIS — I4891 Unspecified atrial fibrillation: Secondary | ICD-10-CM | POA: Diagnosis not present

## 2020-03-28 DIAGNOSIS — I10 Essential (primary) hypertension: Secondary | ICD-10-CM | POA: Diagnosis not present

## 2020-03-28 DIAGNOSIS — M6259 Muscle wasting and atrophy, not elsewhere classified, multiple sites: Secondary | ICD-10-CM | POA: Diagnosis not present

## 2020-03-28 DIAGNOSIS — S81802D Unspecified open wound, left lower leg, subsequent encounter: Secondary | ICD-10-CM | POA: Diagnosis not present

## 2020-04-04 DIAGNOSIS — I10 Essential (primary) hypertension: Secondary | ICD-10-CM | POA: Diagnosis not present

## 2020-04-04 DIAGNOSIS — E7849 Other hyperlipidemia: Secondary | ICD-10-CM | POA: Diagnosis not present

## 2020-04-04 DIAGNOSIS — I4891 Unspecified atrial fibrillation: Secondary | ICD-10-CM | POA: Diagnosis not present

## 2020-04-04 DIAGNOSIS — M6259 Muscle wasting and atrophy, not elsewhere classified, multiple sites: Secondary | ICD-10-CM | POA: Diagnosis not present

## 2020-04-04 DIAGNOSIS — E119 Type 2 diabetes mellitus without complications: Secondary | ICD-10-CM | POA: Diagnosis not present

## 2020-04-04 DIAGNOSIS — I872 Venous insufficiency (chronic) (peripheral): Secondary | ICD-10-CM | POA: Diagnosis not present

## 2020-04-04 DIAGNOSIS — I509 Heart failure, unspecified: Secondary | ICD-10-CM | POA: Diagnosis not present

## 2020-04-04 DIAGNOSIS — S81802D Unspecified open wound, left lower leg, subsequent encounter: Secondary | ICD-10-CM | POA: Diagnosis not present

## 2020-04-04 DIAGNOSIS — I8290 Acute embolism and thrombosis of unspecified vein: Secondary | ICD-10-CM | POA: Diagnosis not present

## 2020-04-06 DIAGNOSIS — R791 Abnormal coagulation profile: Secondary | ICD-10-CM | POA: Diagnosis not present

## 2020-04-06 DIAGNOSIS — I4891 Unspecified atrial fibrillation: Secondary | ICD-10-CM | POA: Diagnosis not present

## 2020-04-08 DIAGNOSIS — I8290 Acute embolism and thrombosis of unspecified vein: Secondary | ICD-10-CM | POA: Diagnosis not present

## 2020-04-08 DIAGNOSIS — I509 Heart failure, unspecified: Secondary | ICD-10-CM | POA: Diagnosis not present

## 2020-04-08 DIAGNOSIS — I10 Essential (primary) hypertension: Secondary | ICD-10-CM | POA: Diagnosis not present

## 2020-04-08 DIAGNOSIS — E7849 Other hyperlipidemia: Secondary | ICD-10-CM | POA: Diagnosis not present

## 2020-04-08 DIAGNOSIS — I872 Venous insufficiency (chronic) (peripheral): Secondary | ICD-10-CM | POA: Diagnosis not present

## 2020-04-08 DIAGNOSIS — M6259 Muscle wasting and atrophy, not elsewhere classified, multiple sites: Secondary | ICD-10-CM | POA: Diagnosis not present

## 2020-04-08 DIAGNOSIS — E119 Type 2 diabetes mellitus without complications: Secondary | ICD-10-CM | POA: Diagnosis not present

## 2020-04-08 DIAGNOSIS — S81802D Unspecified open wound, left lower leg, subsequent encounter: Secondary | ICD-10-CM | POA: Diagnosis not present

## 2020-04-08 DIAGNOSIS — I4891 Unspecified atrial fibrillation: Secondary | ICD-10-CM | POA: Diagnosis not present

## 2020-04-11 DIAGNOSIS — S81802D Unspecified open wound, left lower leg, subsequent encounter: Secondary | ICD-10-CM | POA: Diagnosis not present

## 2020-04-11 DIAGNOSIS — I872 Venous insufficiency (chronic) (peripheral): Secondary | ICD-10-CM | POA: Diagnosis not present

## 2020-04-11 DIAGNOSIS — E7849 Other hyperlipidemia: Secondary | ICD-10-CM | POA: Diagnosis not present

## 2020-04-11 DIAGNOSIS — I8290 Acute embolism and thrombosis of unspecified vein: Secondary | ICD-10-CM | POA: Diagnosis not present

## 2020-04-11 DIAGNOSIS — I509 Heart failure, unspecified: Secondary | ICD-10-CM | POA: Diagnosis not present

## 2020-04-11 DIAGNOSIS — M6259 Muscle wasting and atrophy, not elsewhere classified, multiple sites: Secondary | ICD-10-CM | POA: Diagnosis not present

## 2020-04-11 DIAGNOSIS — E119 Type 2 diabetes mellitus without complications: Secondary | ICD-10-CM | POA: Diagnosis not present

## 2020-04-11 DIAGNOSIS — I4891 Unspecified atrial fibrillation: Secondary | ICD-10-CM | POA: Diagnosis not present

## 2020-04-11 DIAGNOSIS — I10 Essential (primary) hypertension: Secondary | ICD-10-CM | POA: Diagnosis not present

## 2020-04-13 DIAGNOSIS — L304 Erythema intertrigo: Secondary | ICD-10-CM | POA: Diagnosis not present

## 2020-04-13 DIAGNOSIS — I10 Essential (primary) hypertension: Secondary | ICD-10-CM | POA: Diagnosis not present

## 2020-04-13 DIAGNOSIS — I509 Heart failure, unspecified: Secondary | ICD-10-CM | POA: Diagnosis not present

## 2020-04-13 DIAGNOSIS — Z794 Long term (current) use of insulin: Secondary | ICD-10-CM | POA: Diagnosis not present

## 2020-04-13 DIAGNOSIS — E876 Hypokalemia: Secondary | ICD-10-CM | POA: Diagnosis not present

## 2020-04-13 DIAGNOSIS — E119 Type 2 diabetes mellitus without complications: Secondary | ICD-10-CM | POA: Diagnosis not present

## 2020-04-13 DIAGNOSIS — I872 Venous insufficiency (chronic) (peripheral): Secondary | ICD-10-CM | POA: Diagnosis not present

## 2020-04-13 DIAGNOSIS — E7849 Other hyperlipidemia: Secondary | ICD-10-CM | POA: Diagnosis not present

## 2020-04-18 DIAGNOSIS — S81802D Unspecified open wound, left lower leg, subsequent encounter: Secondary | ICD-10-CM | POA: Diagnosis not present

## 2020-04-18 DIAGNOSIS — I872 Venous insufficiency (chronic) (peripheral): Secondary | ICD-10-CM | POA: Diagnosis not present

## 2020-04-18 DIAGNOSIS — E119 Type 2 diabetes mellitus without complications: Secondary | ICD-10-CM | POA: Diagnosis not present

## 2020-04-18 DIAGNOSIS — I8290 Acute embolism and thrombosis of unspecified vein: Secondary | ICD-10-CM | POA: Diagnosis not present

## 2020-04-18 DIAGNOSIS — I10 Essential (primary) hypertension: Secondary | ICD-10-CM | POA: Diagnosis not present

## 2020-04-18 DIAGNOSIS — E7849 Other hyperlipidemia: Secondary | ICD-10-CM | POA: Diagnosis not present

## 2020-04-18 DIAGNOSIS — I4891 Unspecified atrial fibrillation: Secondary | ICD-10-CM | POA: Diagnosis not present

## 2020-04-18 DIAGNOSIS — M6259 Muscle wasting and atrophy, not elsewhere classified, multiple sites: Secondary | ICD-10-CM | POA: Diagnosis not present

## 2020-04-18 DIAGNOSIS — I509 Heart failure, unspecified: Secondary | ICD-10-CM | POA: Diagnosis not present

## 2020-04-25 DIAGNOSIS — E119 Type 2 diabetes mellitus without complications: Secondary | ICD-10-CM | POA: Diagnosis not present

## 2020-04-25 DIAGNOSIS — I10 Essential (primary) hypertension: Secondary | ICD-10-CM | POA: Diagnosis not present

## 2020-04-25 DIAGNOSIS — I872 Venous insufficiency (chronic) (peripheral): Secondary | ICD-10-CM | POA: Diagnosis not present

## 2020-04-25 DIAGNOSIS — S81802D Unspecified open wound, left lower leg, subsequent encounter: Secondary | ICD-10-CM | POA: Diagnosis not present

## 2020-04-25 DIAGNOSIS — I8290 Acute embolism and thrombosis of unspecified vein: Secondary | ICD-10-CM | POA: Diagnosis not present

## 2020-04-25 DIAGNOSIS — M6259 Muscle wasting and atrophy, not elsewhere classified, multiple sites: Secondary | ICD-10-CM | POA: Diagnosis not present

## 2020-04-25 DIAGNOSIS — I4891 Unspecified atrial fibrillation: Secondary | ICD-10-CM | POA: Diagnosis not present

## 2020-04-25 DIAGNOSIS — E7849 Other hyperlipidemia: Secondary | ICD-10-CM | POA: Diagnosis not present

## 2020-04-25 DIAGNOSIS — I509 Heart failure, unspecified: Secondary | ICD-10-CM | POA: Diagnosis not present

## 2020-05-02 DIAGNOSIS — I872 Venous insufficiency (chronic) (peripheral): Secondary | ICD-10-CM | POA: Diagnosis not present

## 2020-05-02 DIAGNOSIS — S81802D Unspecified open wound, left lower leg, subsequent encounter: Secondary | ICD-10-CM | POA: Diagnosis not present

## 2020-05-02 DIAGNOSIS — E7849 Other hyperlipidemia: Secondary | ICD-10-CM | POA: Diagnosis not present

## 2020-05-02 DIAGNOSIS — I4891 Unspecified atrial fibrillation: Secondary | ICD-10-CM | POA: Diagnosis not present

## 2020-05-02 DIAGNOSIS — I8290 Acute embolism and thrombosis of unspecified vein: Secondary | ICD-10-CM | POA: Diagnosis not present

## 2020-05-02 DIAGNOSIS — I509 Heart failure, unspecified: Secondary | ICD-10-CM | POA: Diagnosis not present

## 2020-05-02 DIAGNOSIS — E119 Type 2 diabetes mellitus without complications: Secondary | ICD-10-CM | POA: Diagnosis not present

## 2020-05-02 DIAGNOSIS — I10 Essential (primary) hypertension: Secondary | ICD-10-CM | POA: Diagnosis not present

## 2020-05-02 DIAGNOSIS — M6259 Muscle wasting and atrophy, not elsewhere classified, multiple sites: Secondary | ICD-10-CM | POA: Diagnosis not present

## 2020-05-04 DIAGNOSIS — Z7901 Long term (current) use of anticoagulants: Secondary | ICD-10-CM | POA: Diagnosis not present

## 2020-05-04 DIAGNOSIS — I482 Chronic atrial fibrillation, unspecified: Secondary | ICD-10-CM | POA: Diagnosis not present

## 2020-05-08 DIAGNOSIS — I4891 Unspecified atrial fibrillation: Secondary | ICD-10-CM | POA: Diagnosis not present

## 2020-05-08 DIAGNOSIS — I10 Essential (primary) hypertension: Secondary | ICD-10-CM | POA: Diagnosis not present

## 2020-05-08 DIAGNOSIS — E7849 Other hyperlipidemia: Secondary | ICD-10-CM | POA: Diagnosis not present

## 2020-05-08 DIAGNOSIS — E119 Type 2 diabetes mellitus without complications: Secondary | ICD-10-CM | POA: Diagnosis not present

## 2020-05-08 DIAGNOSIS — M6259 Muscle wasting and atrophy, not elsewhere classified, multiple sites: Secondary | ICD-10-CM | POA: Diagnosis not present

## 2020-05-08 DIAGNOSIS — I8290 Acute embolism and thrombosis of unspecified vein: Secondary | ICD-10-CM | POA: Diagnosis not present

## 2020-05-08 DIAGNOSIS — I872 Venous insufficiency (chronic) (peripheral): Secondary | ICD-10-CM | POA: Diagnosis not present

## 2020-05-08 DIAGNOSIS — I509 Heart failure, unspecified: Secondary | ICD-10-CM | POA: Diagnosis not present

## 2020-05-08 DIAGNOSIS — S81802D Unspecified open wound, left lower leg, subsequent encounter: Secondary | ICD-10-CM | POA: Diagnosis not present

## 2020-05-09 DIAGNOSIS — S81802D Unspecified open wound, left lower leg, subsequent encounter: Secondary | ICD-10-CM | POA: Diagnosis not present

## 2020-05-09 DIAGNOSIS — I872 Venous insufficiency (chronic) (peripheral): Secondary | ICD-10-CM | POA: Diagnosis not present

## 2020-05-09 DIAGNOSIS — I8290 Acute embolism and thrombosis of unspecified vein: Secondary | ICD-10-CM | POA: Diagnosis not present

## 2020-05-09 DIAGNOSIS — I509 Heart failure, unspecified: Secondary | ICD-10-CM | POA: Diagnosis not present

## 2020-05-09 DIAGNOSIS — E7849 Other hyperlipidemia: Secondary | ICD-10-CM | POA: Diagnosis not present

## 2020-05-09 DIAGNOSIS — E119 Type 2 diabetes mellitus without complications: Secondary | ICD-10-CM | POA: Diagnosis not present

## 2020-05-09 DIAGNOSIS — I4891 Unspecified atrial fibrillation: Secondary | ICD-10-CM | POA: Diagnosis not present

## 2020-05-09 DIAGNOSIS — I10 Essential (primary) hypertension: Secondary | ICD-10-CM | POA: Diagnosis not present

## 2020-05-09 DIAGNOSIS — M6259 Muscle wasting and atrophy, not elsewhere classified, multiple sites: Secondary | ICD-10-CM | POA: Diagnosis not present

## 2020-05-11 DIAGNOSIS — Z7901 Long term (current) use of anticoagulants: Secondary | ICD-10-CM | POA: Diagnosis not present

## 2020-05-11 DIAGNOSIS — I4821 Permanent atrial fibrillation: Secondary | ICD-10-CM | POA: Diagnosis not present

## 2020-05-15 DIAGNOSIS — I4891 Unspecified atrial fibrillation: Secondary | ICD-10-CM | POA: Diagnosis not present

## 2020-05-15 DIAGNOSIS — Z7901 Long term (current) use of anticoagulants: Secondary | ICD-10-CM | POA: Diagnosis not present

## 2020-05-16 DIAGNOSIS — I358 Other nonrheumatic aortic valve disorders: Secondary | ICD-10-CM | POA: Diagnosis not present

## 2020-05-16 DIAGNOSIS — I2510 Atherosclerotic heart disease of native coronary artery without angina pectoris: Secondary | ICD-10-CM | POA: Diagnosis not present

## 2020-05-16 DIAGNOSIS — Z7982 Long term (current) use of aspirin: Secondary | ICD-10-CM | POA: Diagnosis not present

## 2020-05-16 DIAGNOSIS — Z79899 Other long term (current) drug therapy: Secondary | ICD-10-CM | POA: Diagnosis not present

## 2020-05-16 DIAGNOSIS — I10 Essential (primary) hypertension: Secondary | ICD-10-CM | POA: Diagnosis not present

## 2020-05-16 DIAGNOSIS — I4819 Other persistent atrial fibrillation: Secondary | ICD-10-CM | POA: Diagnosis not present

## 2020-05-16 DIAGNOSIS — I998 Other disorder of circulatory system: Secondary | ICD-10-CM | POA: Diagnosis not present

## 2020-05-16 DIAGNOSIS — Z7901 Long term (current) use of anticoagulants: Secondary | ICD-10-CM | POA: Diagnosis not present

## 2020-05-16 DIAGNOSIS — Z955 Presence of coronary angioplasty implant and graft: Secondary | ICD-10-CM | POA: Diagnosis not present

## 2020-05-16 DIAGNOSIS — E785 Hyperlipidemia, unspecified: Secondary | ICD-10-CM | POA: Diagnosis not present

## 2020-05-17 DIAGNOSIS — I872 Venous insufficiency (chronic) (peripheral): Secondary | ICD-10-CM | POA: Diagnosis not present

## 2020-05-17 DIAGNOSIS — E7849 Other hyperlipidemia: Secondary | ICD-10-CM | POA: Diagnosis not present

## 2020-05-17 DIAGNOSIS — I8290 Acute embolism and thrombosis of unspecified vein: Secondary | ICD-10-CM | POA: Diagnosis not present

## 2020-05-17 DIAGNOSIS — M6259 Muscle wasting and atrophy, not elsewhere classified, multiple sites: Secondary | ICD-10-CM | POA: Diagnosis not present

## 2020-05-17 DIAGNOSIS — I509 Heart failure, unspecified: Secondary | ICD-10-CM | POA: Diagnosis not present

## 2020-05-17 DIAGNOSIS — I4891 Unspecified atrial fibrillation: Secondary | ICD-10-CM | POA: Diagnosis not present

## 2020-05-17 DIAGNOSIS — E119 Type 2 diabetes mellitus without complications: Secondary | ICD-10-CM | POA: Diagnosis not present

## 2020-05-17 DIAGNOSIS — S81802D Unspecified open wound, left lower leg, subsequent encounter: Secondary | ICD-10-CM | POA: Diagnosis not present

## 2020-05-17 DIAGNOSIS — I10 Essential (primary) hypertension: Secondary | ICD-10-CM | POA: Diagnosis not present

## 2020-05-25 DIAGNOSIS — E876 Hypokalemia: Secondary | ICD-10-CM | POA: Diagnosis not present

## 2020-05-25 DIAGNOSIS — I872 Venous insufficiency (chronic) (peripheral): Secondary | ICD-10-CM | POA: Diagnosis not present

## 2020-05-25 DIAGNOSIS — I10 Essential (primary) hypertension: Secondary | ICD-10-CM | POA: Diagnosis not present

## 2020-05-25 DIAGNOSIS — L0390 Cellulitis, unspecified: Secondary | ICD-10-CM | POA: Diagnosis not present

## 2020-05-25 DIAGNOSIS — E119 Type 2 diabetes mellitus without complications: Secondary | ICD-10-CM | POA: Diagnosis not present

## 2020-05-25 DIAGNOSIS — I4891 Unspecified atrial fibrillation: Secondary | ICD-10-CM | POA: Diagnosis not present

## 2020-05-25 DIAGNOSIS — E7849 Other hyperlipidemia: Secondary | ICD-10-CM | POA: Diagnosis not present

## 2020-05-30 DIAGNOSIS — E119 Type 2 diabetes mellitus without complications: Secondary | ICD-10-CM | POA: Diagnosis not present

## 2020-06-05 DIAGNOSIS — N390 Urinary tract infection, site not specified: Secondary | ICD-10-CM | POA: Diagnosis not present

## 2020-06-05 DIAGNOSIS — I509 Heart failure, unspecified: Secondary | ICD-10-CM | POA: Diagnosis not present

## 2020-06-05 DIAGNOSIS — I872 Venous insufficiency (chronic) (peripheral): Secondary | ICD-10-CM | POA: Diagnosis not present

## 2020-06-05 DIAGNOSIS — L97821 Non-pressure chronic ulcer of other part of left lower leg limited to breakdown of skin: Secondary | ICD-10-CM | POA: Diagnosis not present

## 2020-06-05 DIAGNOSIS — I10 Essential (primary) hypertension: Secondary | ICD-10-CM | POA: Diagnosis not present

## 2020-06-05 DIAGNOSIS — L03116 Cellulitis of left lower limb: Secondary | ICD-10-CM | POA: Diagnosis not present

## 2020-06-05 DIAGNOSIS — I4891 Unspecified atrial fibrillation: Secondary | ICD-10-CM | POA: Diagnosis not present

## 2020-06-05 DIAGNOSIS — M6259 Muscle wasting and atrophy, not elsewhere classified, multiple sites: Secondary | ICD-10-CM | POA: Diagnosis not present

## 2020-06-05 DIAGNOSIS — E119 Type 2 diabetes mellitus without complications: Secondary | ICD-10-CM | POA: Diagnosis not present

## 2020-06-05 DIAGNOSIS — E7841 Elevated Lipoprotein(a): Secondary | ICD-10-CM | POA: Diagnosis not present

## 2020-06-07 DIAGNOSIS — I10 Essential (primary) hypertension: Secondary | ICD-10-CM | POA: Diagnosis not present

## 2020-06-07 DIAGNOSIS — E7849 Other hyperlipidemia: Secondary | ICD-10-CM | POA: Diagnosis not present

## 2020-06-07 DIAGNOSIS — E119 Type 2 diabetes mellitus without complications: Secondary | ICD-10-CM | POA: Diagnosis not present

## 2020-06-08 DIAGNOSIS — I872 Venous insufficiency (chronic) (peripheral): Secondary | ICD-10-CM | POA: Diagnosis not present

## 2020-06-08 DIAGNOSIS — E119 Type 2 diabetes mellitus without complications: Secondary | ICD-10-CM | POA: Diagnosis not present

## 2020-06-08 DIAGNOSIS — I4891 Unspecified atrial fibrillation: Secondary | ICD-10-CM | POA: Diagnosis not present

## 2020-06-08 DIAGNOSIS — I509 Heart failure, unspecified: Secondary | ICD-10-CM | POA: Diagnosis not present

## 2020-06-08 DIAGNOSIS — M6259 Muscle wasting and atrophy, not elsewhere classified, multiple sites: Secondary | ICD-10-CM | POA: Diagnosis not present

## 2020-06-08 DIAGNOSIS — I10 Essential (primary) hypertension: Secondary | ICD-10-CM | POA: Diagnosis not present

## 2020-06-08 DIAGNOSIS — E7841 Elevated Lipoprotein(a): Secondary | ICD-10-CM | POA: Diagnosis not present

## 2020-06-08 DIAGNOSIS — L03116 Cellulitis of left lower limb: Secondary | ICD-10-CM | POA: Diagnosis not present

## 2020-06-08 DIAGNOSIS — L97821 Non-pressure chronic ulcer of other part of left lower leg limited to breakdown of skin: Secondary | ICD-10-CM | POA: Diagnosis not present

## 2020-06-12 DIAGNOSIS — I4891 Unspecified atrial fibrillation: Secondary | ICD-10-CM | POA: Diagnosis not present

## 2020-06-12 DIAGNOSIS — I872 Venous insufficiency (chronic) (peripheral): Secondary | ICD-10-CM | POA: Diagnosis not present

## 2020-06-12 DIAGNOSIS — I10 Essential (primary) hypertension: Secondary | ICD-10-CM | POA: Diagnosis not present

## 2020-06-12 DIAGNOSIS — M6259 Muscle wasting and atrophy, not elsewhere classified, multiple sites: Secondary | ICD-10-CM | POA: Diagnosis not present

## 2020-06-12 DIAGNOSIS — L97821 Non-pressure chronic ulcer of other part of left lower leg limited to breakdown of skin: Secondary | ICD-10-CM | POA: Diagnosis not present

## 2020-06-12 DIAGNOSIS — I509 Heart failure, unspecified: Secondary | ICD-10-CM | POA: Diagnosis not present

## 2020-06-12 DIAGNOSIS — E119 Type 2 diabetes mellitus without complications: Secondary | ICD-10-CM | POA: Diagnosis not present

## 2020-06-12 DIAGNOSIS — L03116 Cellulitis of left lower limb: Secondary | ICD-10-CM | POA: Diagnosis not present

## 2020-06-12 DIAGNOSIS — E7841 Elevated Lipoprotein(a): Secondary | ICD-10-CM | POA: Diagnosis not present

## 2020-06-15 DIAGNOSIS — I4891 Unspecified atrial fibrillation: Secondary | ICD-10-CM | POA: Diagnosis not present

## 2020-06-15 DIAGNOSIS — M6259 Muscle wasting and atrophy, not elsewhere classified, multiple sites: Secondary | ICD-10-CM | POA: Diagnosis not present

## 2020-06-15 DIAGNOSIS — E876 Hypokalemia: Secondary | ICD-10-CM | POA: Diagnosis not present

## 2020-06-15 DIAGNOSIS — E7841 Elevated Lipoprotein(a): Secondary | ICD-10-CM | POA: Diagnosis not present

## 2020-06-15 DIAGNOSIS — I482 Chronic atrial fibrillation, unspecified: Secondary | ICD-10-CM | POA: Diagnosis not present

## 2020-06-15 DIAGNOSIS — L03116 Cellulitis of left lower limb: Secondary | ICD-10-CM | POA: Diagnosis not present

## 2020-06-15 DIAGNOSIS — E119 Type 2 diabetes mellitus without complications: Secondary | ICD-10-CM | POA: Diagnosis not present

## 2020-06-15 DIAGNOSIS — I10 Essential (primary) hypertension: Secondary | ICD-10-CM | POA: Diagnosis not present

## 2020-06-15 DIAGNOSIS — L97821 Non-pressure chronic ulcer of other part of left lower leg limited to breakdown of skin: Secondary | ICD-10-CM | POA: Diagnosis not present

## 2020-06-15 DIAGNOSIS — I872 Venous insufficiency (chronic) (peripheral): Secondary | ICD-10-CM | POA: Diagnosis not present

## 2020-06-15 DIAGNOSIS — I509 Heart failure, unspecified: Secondary | ICD-10-CM | POA: Diagnosis not present

## 2020-06-19 DIAGNOSIS — I872 Venous insufficiency (chronic) (peripheral): Secondary | ICD-10-CM | POA: Diagnosis not present

## 2020-06-19 DIAGNOSIS — I10 Essential (primary) hypertension: Secondary | ICD-10-CM | POA: Diagnosis not present

## 2020-06-19 DIAGNOSIS — L03116 Cellulitis of left lower limb: Secondary | ICD-10-CM | POA: Diagnosis not present

## 2020-06-19 DIAGNOSIS — E7841 Elevated Lipoprotein(a): Secondary | ICD-10-CM | POA: Diagnosis not present

## 2020-06-19 DIAGNOSIS — E119 Type 2 diabetes mellitus without complications: Secondary | ICD-10-CM | POA: Diagnosis not present

## 2020-06-19 DIAGNOSIS — I509 Heart failure, unspecified: Secondary | ICD-10-CM | POA: Diagnosis not present

## 2020-06-19 DIAGNOSIS — I4891 Unspecified atrial fibrillation: Secondary | ICD-10-CM | POA: Diagnosis not present

## 2020-06-19 DIAGNOSIS — L97821 Non-pressure chronic ulcer of other part of left lower leg limited to breakdown of skin: Secondary | ICD-10-CM | POA: Diagnosis not present

## 2020-06-19 DIAGNOSIS — M6259 Muscle wasting and atrophy, not elsewhere classified, multiple sites: Secondary | ICD-10-CM | POA: Diagnosis not present

## 2020-06-22 DIAGNOSIS — I872 Venous insufficiency (chronic) (peripheral): Secondary | ICD-10-CM | POA: Diagnosis not present

## 2020-06-22 DIAGNOSIS — I4891 Unspecified atrial fibrillation: Secondary | ICD-10-CM | POA: Diagnosis not present

## 2020-06-22 DIAGNOSIS — E119 Type 2 diabetes mellitus without complications: Secondary | ICD-10-CM | POA: Diagnosis not present

## 2020-06-22 DIAGNOSIS — L03116 Cellulitis of left lower limb: Secondary | ICD-10-CM | POA: Diagnosis not present

## 2020-06-22 DIAGNOSIS — E7841 Elevated Lipoprotein(a): Secondary | ICD-10-CM | POA: Diagnosis not present

## 2020-06-22 DIAGNOSIS — M6259 Muscle wasting and atrophy, not elsewhere classified, multiple sites: Secondary | ICD-10-CM | POA: Diagnosis not present

## 2020-06-22 DIAGNOSIS — I8290 Acute embolism and thrombosis of unspecified vein: Secondary | ICD-10-CM | POA: Diagnosis not present

## 2020-06-22 DIAGNOSIS — L97821 Non-pressure chronic ulcer of other part of left lower leg limited to breakdown of skin: Secondary | ICD-10-CM | POA: Diagnosis not present

## 2020-06-22 DIAGNOSIS — I509 Heart failure, unspecified: Secondary | ICD-10-CM | POA: Diagnosis not present

## 2020-06-22 DIAGNOSIS — I10 Essential (primary) hypertension: Secondary | ICD-10-CM | POA: Diagnosis not present

## 2020-06-26 DIAGNOSIS — M6259 Muscle wasting and atrophy, not elsewhere classified, multiple sites: Secondary | ICD-10-CM | POA: Diagnosis not present

## 2020-06-26 DIAGNOSIS — I872 Venous insufficiency (chronic) (peripheral): Secondary | ICD-10-CM | POA: Diagnosis not present

## 2020-06-26 DIAGNOSIS — L03116 Cellulitis of left lower limb: Secondary | ICD-10-CM | POA: Diagnosis not present

## 2020-06-26 DIAGNOSIS — I10 Essential (primary) hypertension: Secondary | ICD-10-CM | POA: Diagnosis not present

## 2020-06-26 DIAGNOSIS — E119 Type 2 diabetes mellitus without complications: Secondary | ICD-10-CM | POA: Diagnosis not present

## 2020-06-26 DIAGNOSIS — I4891 Unspecified atrial fibrillation: Secondary | ICD-10-CM | POA: Diagnosis not present

## 2020-06-26 DIAGNOSIS — I509 Heart failure, unspecified: Secondary | ICD-10-CM | POA: Diagnosis not present

## 2020-06-26 DIAGNOSIS — E7841 Elevated Lipoprotein(a): Secondary | ICD-10-CM | POA: Diagnosis not present

## 2020-06-26 DIAGNOSIS — L97821 Non-pressure chronic ulcer of other part of left lower leg limited to breakdown of skin: Secondary | ICD-10-CM | POA: Diagnosis not present

## 2020-06-29 DIAGNOSIS — E119 Type 2 diabetes mellitus without complications: Secondary | ICD-10-CM | POA: Diagnosis not present

## 2020-06-29 DIAGNOSIS — I509 Heart failure, unspecified: Secondary | ICD-10-CM | POA: Diagnosis not present

## 2020-06-29 DIAGNOSIS — I872 Venous insufficiency (chronic) (peripheral): Secondary | ICD-10-CM | POA: Diagnosis not present

## 2020-06-29 DIAGNOSIS — M6259 Muscle wasting and atrophy, not elsewhere classified, multiple sites: Secondary | ICD-10-CM | POA: Diagnosis not present

## 2020-06-29 DIAGNOSIS — L03116 Cellulitis of left lower limb: Secondary | ICD-10-CM | POA: Diagnosis not present

## 2020-06-29 DIAGNOSIS — L97821 Non-pressure chronic ulcer of other part of left lower leg limited to breakdown of skin: Secondary | ICD-10-CM | POA: Diagnosis not present

## 2020-06-29 DIAGNOSIS — I4891 Unspecified atrial fibrillation: Secondary | ICD-10-CM | POA: Diagnosis not present

## 2020-06-29 DIAGNOSIS — E7841 Elevated Lipoprotein(a): Secondary | ICD-10-CM | POA: Diagnosis not present

## 2020-06-29 DIAGNOSIS — I10 Essential (primary) hypertension: Secondary | ICD-10-CM | POA: Diagnosis not present

## 2020-07-01 IMAGING — DX DG CHEST 1V PORT
1 series · 1 of 1 positions shown · non-contrast
Comparison: 08/14/2018

CLINICAL DATA: Chest pain, shortness of breath

EXAM:
PORTABLE CHEST 1 VIEW

[chest ap]
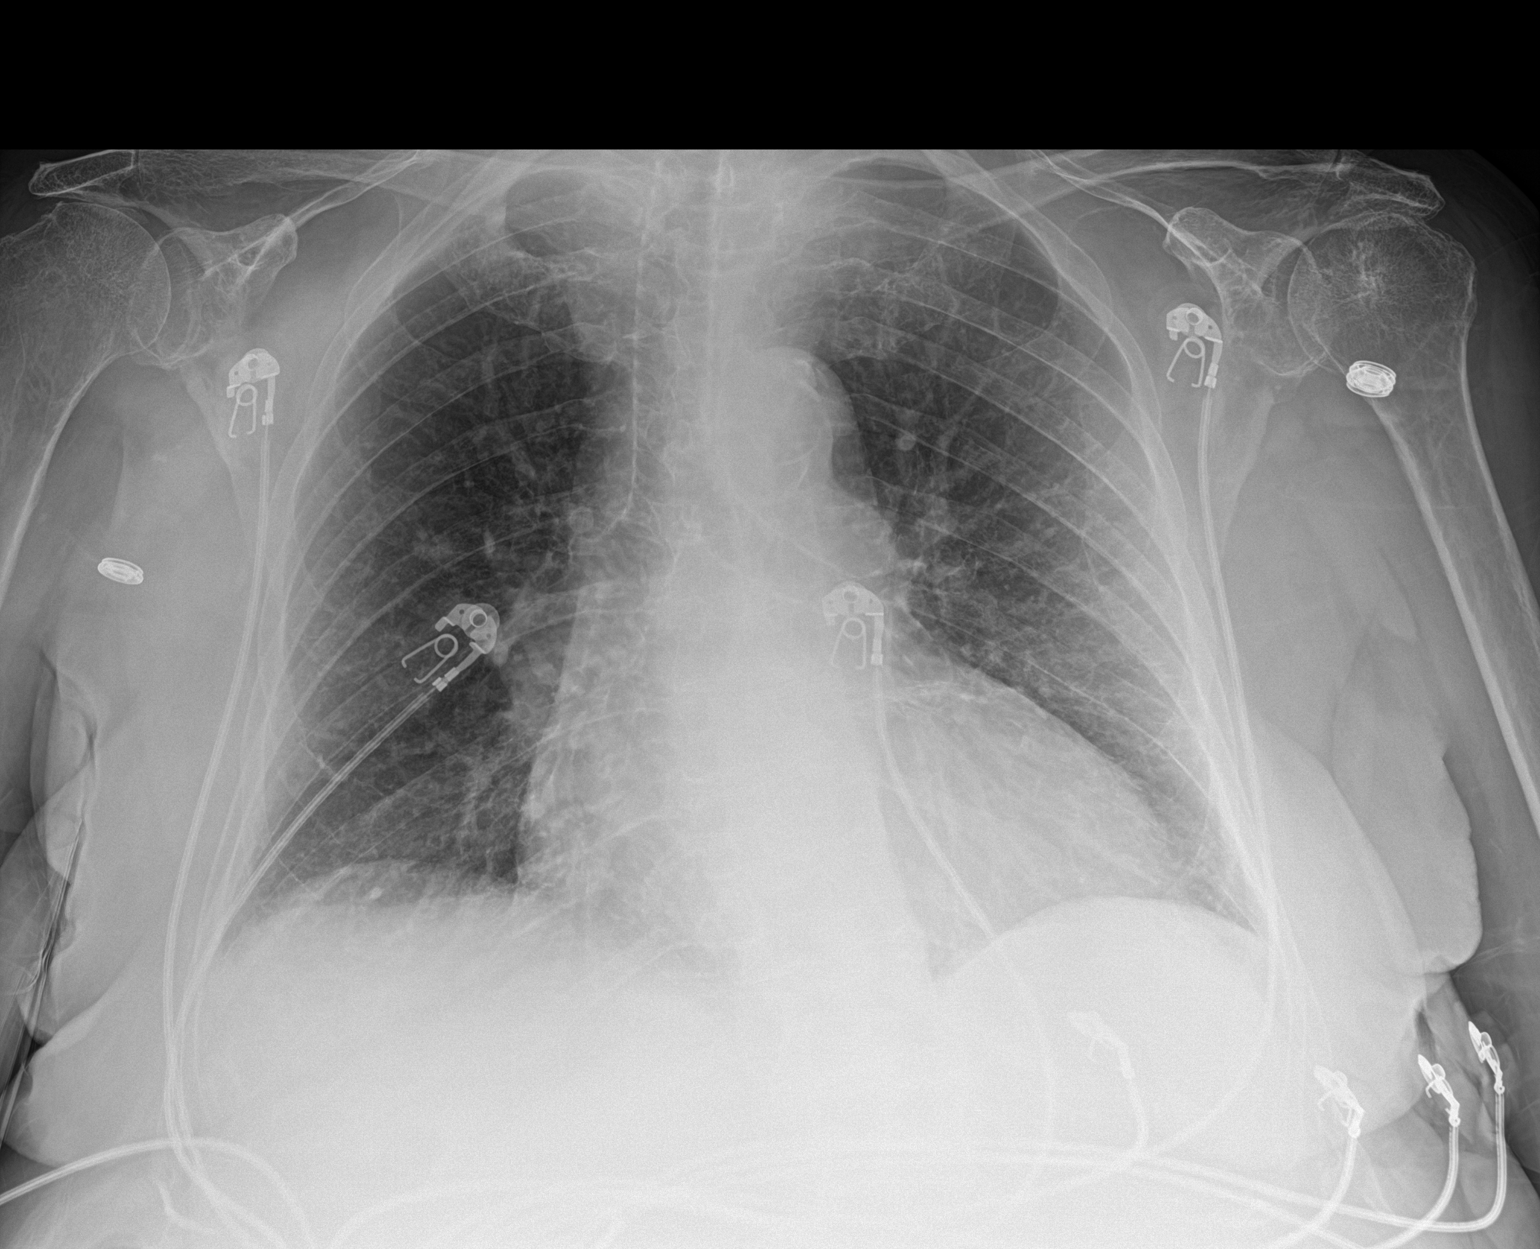

[1 of 1 positions shown; findings below may reference images not displayed]

FINDINGS: The heart size and mediastinal contours are stable. Atherosclerotic
calcification of the aortic knob. Chronic increased interstitial
markings throughout both lungs. No superimposed airspace
consolidation. No pleural effusion or pneumothorax.
IMPRESSION: Chronic bronchitic type lung changes. No superimposed airspace
opacity.

## 2020-07-03 DIAGNOSIS — L03116 Cellulitis of left lower limb: Secondary | ICD-10-CM | POA: Diagnosis not present

## 2020-07-03 DIAGNOSIS — E119 Type 2 diabetes mellitus without complications: Secondary | ICD-10-CM | POA: Diagnosis not present

## 2020-07-03 DIAGNOSIS — L97821 Non-pressure chronic ulcer of other part of left lower leg limited to breakdown of skin: Secondary | ICD-10-CM | POA: Diagnosis not present

## 2020-07-03 DIAGNOSIS — I4891 Unspecified atrial fibrillation: Secondary | ICD-10-CM | POA: Diagnosis not present

## 2020-07-03 DIAGNOSIS — I509 Heart failure, unspecified: Secondary | ICD-10-CM | POA: Diagnosis not present

## 2020-07-03 DIAGNOSIS — I872 Venous insufficiency (chronic) (peripheral): Secondary | ICD-10-CM | POA: Diagnosis not present

## 2020-07-03 DIAGNOSIS — I10 Essential (primary) hypertension: Secondary | ICD-10-CM | POA: Diagnosis not present

## 2020-07-03 DIAGNOSIS — M6259 Muscle wasting and atrophy, not elsewhere classified, multiple sites: Secondary | ICD-10-CM | POA: Diagnosis not present

## 2020-07-03 DIAGNOSIS — E7841 Elevated Lipoprotein(a): Secondary | ICD-10-CM | POA: Diagnosis not present

## 2020-07-05 DIAGNOSIS — L97821 Non-pressure chronic ulcer of other part of left lower leg limited to breakdown of skin: Secondary | ICD-10-CM | POA: Diagnosis not present

## 2020-07-05 DIAGNOSIS — M6259 Muscle wasting and atrophy, not elsewhere classified, multiple sites: Secondary | ICD-10-CM | POA: Diagnosis not present

## 2020-07-05 DIAGNOSIS — E119 Type 2 diabetes mellitus without complications: Secondary | ICD-10-CM | POA: Diagnosis not present

## 2020-07-05 DIAGNOSIS — I10 Essential (primary) hypertension: Secondary | ICD-10-CM | POA: Diagnosis not present

## 2020-07-05 DIAGNOSIS — I509 Heart failure, unspecified: Secondary | ICD-10-CM | POA: Diagnosis not present

## 2020-07-05 DIAGNOSIS — I4891 Unspecified atrial fibrillation: Secondary | ICD-10-CM | POA: Diagnosis not present

## 2020-07-05 DIAGNOSIS — L03116 Cellulitis of left lower limb: Secondary | ICD-10-CM | POA: Diagnosis not present

## 2020-07-05 DIAGNOSIS — I872 Venous insufficiency (chronic) (peripheral): Secondary | ICD-10-CM | POA: Diagnosis not present

## 2020-07-05 DIAGNOSIS — E7841 Elevated Lipoprotein(a): Secondary | ICD-10-CM | POA: Diagnosis not present

## 2020-07-06 DIAGNOSIS — L03116 Cellulitis of left lower limb: Secondary | ICD-10-CM | POA: Diagnosis not present

## 2020-07-06 DIAGNOSIS — E119 Type 2 diabetes mellitus without complications: Secondary | ICD-10-CM | POA: Diagnosis not present

## 2020-07-06 DIAGNOSIS — I482 Chronic atrial fibrillation, unspecified: Secondary | ICD-10-CM | POA: Diagnosis not present

## 2020-07-06 DIAGNOSIS — I872 Venous insufficiency (chronic) (peripheral): Secondary | ICD-10-CM | POA: Diagnosis not present

## 2020-07-06 DIAGNOSIS — E7849 Other hyperlipidemia: Secondary | ICD-10-CM | POA: Diagnosis not present

## 2020-07-06 DIAGNOSIS — M6259 Muscle wasting and atrophy, not elsewhere classified, multiple sites: Secondary | ICD-10-CM | POA: Diagnosis not present

## 2020-07-06 DIAGNOSIS — I509 Heart failure, unspecified: Secondary | ICD-10-CM | POA: Diagnosis not present

## 2020-07-06 DIAGNOSIS — I4891 Unspecified atrial fibrillation: Secondary | ICD-10-CM | POA: Diagnosis not present

## 2020-07-06 DIAGNOSIS — E876 Hypokalemia: Secondary | ICD-10-CM | POA: Diagnosis not present

## 2020-07-06 DIAGNOSIS — Z794 Long term (current) use of insulin: Secondary | ICD-10-CM | POA: Diagnosis not present

## 2020-07-06 DIAGNOSIS — L97821 Non-pressure chronic ulcer of other part of left lower leg limited to breakdown of skin: Secondary | ICD-10-CM | POA: Diagnosis not present

## 2020-07-06 DIAGNOSIS — R918 Other nonspecific abnormal finding of lung field: Secondary | ICD-10-CM | POA: Diagnosis not present

## 2020-07-06 DIAGNOSIS — J439 Emphysema, unspecified: Secondary | ICD-10-CM | POA: Diagnosis not present

## 2020-07-06 DIAGNOSIS — E7841 Elevated Lipoprotein(a): Secondary | ICD-10-CM | POA: Diagnosis not present

## 2020-07-06 DIAGNOSIS — I10 Essential (primary) hypertension: Secondary | ICD-10-CM | POA: Diagnosis not present

## 2020-07-07 DIAGNOSIS — E119 Type 2 diabetes mellitus without complications: Secondary | ICD-10-CM | POA: Diagnosis not present

## 2020-07-07 DIAGNOSIS — E7849 Other hyperlipidemia: Secondary | ICD-10-CM | POA: Diagnosis not present

## 2020-07-07 DIAGNOSIS — I10 Essential (primary) hypertension: Secondary | ICD-10-CM | POA: Diagnosis not present

## 2020-07-10 DIAGNOSIS — I4891 Unspecified atrial fibrillation: Secondary | ICD-10-CM | POA: Diagnosis not present

## 2020-07-10 DIAGNOSIS — I10 Essential (primary) hypertension: Secondary | ICD-10-CM | POA: Diagnosis not present

## 2020-07-10 DIAGNOSIS — L03116 Cellulitis of left lower limb: Secondary | ICD-10-CM | POA: Diagnosis not present

## 2020-07-10 DIAGNOSIS — I872 Venous insufficiency (chronic) (peripheral): Secondary | ICD-10-CM | POA: Diagnosis not present

## 2020-07-10 DIAGNOSIS — I509 Heart failure, unspecified: Secondary | ICD-10-CM | POA: Diagnosis not present

## 2020-07-10 DIAGNOSIS — E7841 Elevated Lipoprotein(a): Secondary | ICD-10-CM | POA: Diagnosis not present

## 2020-07-10 DIAGNOSIS — E119 Type 2 diabetes mellitus without complications: Secondary | ICD-10-CM | POA: Diagnosis not present

## 2020-07-10 DIAGNOSIS — L97821 Non-pressure chronic ulcer of other part of left lower leg limited to breakdown of skin: Secondary | ICD-10-CM | POA: Diagnosis not present

## 2020-07-10 DIAGNOSIS — M6259 Muscle wasting and atrophy, not elsewhere classified, multiple sites: Secondary | ICD-10-CM | POA: Diagnosis not present

## 2020-07-11 DIAGNOSIS — M79674 Pain in right toe(s): Secondary | ICD-10-CM | POA: Diagnosis not present

## 2020-07-11 DIAGNOSIS — L600 Ingrowing nail: Secondary | ICD-10-CM | POA: Diagnosis not present

## 2020-07-11 DIAGNOSIS — L602 Onychogryphosis: Secondary | ICD-10-CM | POA: Diagnosis not present

## 2020-07-11 DIAGNOSIS — B351 Tinea unguium: Secondary | ICD-10-CM | POA: Diagnosis not present

## 2020-07-11 DIAGNOSIS — M79675 Pain in left toe(s): Secondary | ICD-10-CM | POA: Diagnosis not present

## 2020-07-13 DIAGNOSIS — L97821 Non-pressure chronic ulcer of other part of left lower leg limited to breakdown of skin: Secondary | ICD-10-CM | POA: Diagnosis not present

## 2020-07-13 DIAGNOSIS — N390 Urinary tract infection, site not specified: Secondary | ICD-10-CM | POA: Diagnosis not present

## 2020-07-13 DIAGNOSIS — E785 Hyperlipidemia, unspecified: Secondary | ICD-10-CM | POA: Diagnosis not present

## 2020-07-13 DIAGNOSIS — L03116 Cellulitis of left lower limb: Secondary | ICD-10-CM | POA: Diagnosis not present

## 2020-07-13 DIAGNOSIS — I4891 Unspecified atrial fibrillation: Secondary | ICD-10-CM | POA: Diagnosis not present

## 2020-07-13 DIAGNOSIS — I872 Venous insufficiency (chronic) (peripheral): Secondary | ICD-10-CM | POA: Diagnosis not present

## 2020-07-13 DIAGNOSIS — I10 Essential (primary) hypertension: Secondary | ICD-10-CM | POA: Diagnosis not present

## 2020-07-13 DIAGNOSIS — E119 Type 2 diabetes mellitus without complications: Secondary | ICD-10-CM | POA: Diagnosis not present

## 2020-07-13 DIAGNOSIS — M6259 Muscle wasting and atrophy, not elsewhere classified, multiple sites: Secondary | ICD-10-CM | POA: Diagnosis not present

## 2020-07-13 DIAGNOSIS — E7841 Elevated Lipoprotein(a): Secondary | ICD-10-CM | POA: Diagnosis not present

## 2020-07-13 DIAGNOSIS — I509 Heart failure, unspecified: Secondary | ICD-10-CM | POA: Diagnosis not present

## 2020-07-14 DIAGNOSIS — E119 Type 2 diabetes mellitus without complications: Secondary | ICD-10-CM | POA: Diagnosis not present

## 2020-07-14 DIAGNOSIS — Z7901 Long term (current) use of anticoagulants: Secondary | ICD-10-CM | POA: Diagnosis not present

## 2020-07-14 DIAGNOSIS — D510 Vitamin B12 deficiency anemia due to intrinsic factor deficiency: Secondary | ICD-10-CM | POA: Diagnosis not present

## 2020-07-17 DIAGNOSIS — M6259 Muscle wasting and atrophy, not elsewhere classified, multiple sites: Secondary | ICD-10-CM | POA: Diagnosis not present

## 2020-07-17 DIAGNOSIS — I4891 Unspecified atrial fibrillation: Secondary | ICD-10-CM | POA: Diagnosis not present

## 2020-07-17 DIAGNOSIS — E119 Type 2 diabetes mellitus without complications: Secondary | ICD-10-CM | POA: Diagnosis not present

## 2020-07-17 DIAGNOSIS — E7841 Elevated Lipoprotein(a): Secondary | ICD-10-CM | POA: Diagnosis not present

## 2020-07-17 DIAGNOSIS — I509 Heart failure, unspecified: Secondary | ICD-10-CM | POA: Diagnosis not present

## 2020-07-17 DIAGNOSIS — I10 Essential (primary) hypertension: Secondary | ICD-10-CM | POA: Diagnosis not present

## 2020-07-17 DIAGNOSIS — L97821 Non-pressure chronic ulcer of other part of left lower leg limited to breakdown of skin: Secondary | ICD-10-CM | POA: Diagnosis not present

## 2020-07-17 DIAGNOSIS — L03116 Cellulitis of left lower limb: Secondary | ICD-10-CM | POA: Diagnosis not present

## 2020-07-17 DIAGNOSIS — I872 Venous insufficiency (chronic) (peripheral): Secondary | ICD-10-CM | POA: Diagnosis not present

## 2020-07-18 DIAGNOSIS — L03122 Acute lymphangitis of left axilla: Secondary | ICD-10-CM | POA: Diagnosis not present

## 2020-07-18 DIAGNOSIS — R601 Generalized edema: Secondary | ICD-10-CM | POA: Diagnosis not present

## 2020-07-18 DIAGNOSIS — M79672 Pain in left foot: Secondary | ICD-10-CM | POA: Diagnosis not present

## 2020-07-19 DIAGNOSIS — Z7901 Long term (current) use of anticoagulants: Secondary | ICD-10-CM | POA: Diagnosis not present

## 2020-07-19 DIAGNOSIS — E119 Type 2 diabetes mellitus without complications: Secondary | ICD-10-CM | POA: Diagnosis not present

## 2020-07-20 DIAGNOSIS — F329 Major depressive disorder, single episode, unspecified: Secondary | ICD-10-CM | POA: Diagnosis not present

## 2020-07-20 DIAGNOSIS — E559 Vitamin D deficiency, unspecified: Secondary | ICD-10-CM | POA: Diagnosis not present

## 2020-07-20 DIAGNOSIS — Z7901 Long term (current) use of anticoagulants: Secondary | ICD-10-CM | POA: Diagnosis not present

## 2020-07-20 DIAGNOSIS — I4891 Unspecified atrial fibrillation: Secondary | ICD-10-CM | POA: Diagnosis not present

## 2020-07-21 DIAGNOSIS — D510 Vitamin B12 deficiency anemia due to intrinsic factor deficiency: Secondary | ICD-10-CM | POA: Diagnosis not present

## 2020-07-21 DIAGNOSIS — E119 Type 2 diabetes mellitus without complications: Secondary | ICD-10-CM | POA: Diagnosis not present

## 2020-07-24 DIAGNOSIS — L97821 Non-pressure chronic ulcer of other part of left lower leg limited to breakdown of skin: Secondary | ICD-10-CM | POA: Diagnosis not present

## 2020-07-24 DIAGNOSIS — E119 Type 2 diabetes mellitus without complications: Secondary | ICD-10-CM | POA: Diagnosis not present

## 2020-07-24 DIAGNOSIS — I10 Essential (primary) hypertension: Secondary | ICD-10-CM | POA: Diagnosis not present

## 2020-07-24 DIAGNOSIS — L03116 Cellulitis of left lower limb: Secondary | ICD-10-CM | POA: Diagnosis not present

## 2020-07-24 DIAGNOSIS — I509 Heart failure, unspecified: Secondary | ICD-10-CM | POA: Diagnosis not present

## 2020-07-24 DIAGNOSIS — E7841 Elevated Lipoprotein(a): Secondary | ICD-10-CM | POA: Diagnosis not present

## 2020-07-24 DIAGNOSIS — I4891 Unspecified atrial fibrillation: Secondary | ICD-10-CM | POA: Diagnosis not present

## 2020-07-24 DIAGNOSIS — I872 Venous insufficiency (chronic) (peripheral): Secondary | ICD-10-CM | POA: Diagnosis not present

## 2020-07-24 DIAGNOSIS — M6259 Muscle wasting and atrophy, not elsewhere classified, multiple sites: Secondary | ICD-10-CM | POA: Diagnosis not present

## 2020-07-25 DIAGNOSIS — I482 Chronic atrial fibrillation, unspecified: Secondary | ICD-10-CM | POA: Diagnosis not present

## 2020-07-26 DIAGNOSIS — D510 Vitamin B12 deficiency anemia due to intrinsic factor deficiency: Secondary | ICD-10-CM | POA: Diagnosis not present

## 2020-07-26 DIAGNOSIS — E119 Type 2 diabetes mellitus without complications: Secondary | ICD-10-CM | POA: Diagnosis not present

## 2020-07-27 DIAGNOSIS — E785 Hyperlipidemia, unspecified: Secondary | ICD-10-CM | POA: Diagnosis not present

## 2020-07-27 DIAGNOSIS — Z7901 Long term (current) use of anticoagulants: Secondary | ICD-10-CM | POA: Diagnosis not present

## 2020-07-27 DIAGNOSIS — I4891 Unspecified atrial fibrillation: Secondary | ICD-10-CM | POA: Diagnosis not present

## 2020-07-27 DIAGNOSIS — E559 Vitamin D deficiency, unspecified: Secondary | ICD-10-CM | POA: Diagnosis not present

## 2020-07-31 DIAGNOSIS — E119 Type 2 diabetes mellitus without complications: Secondary | ICD-10-CM | POA: Diagnosis not present

## 2020-07-31 DIAGNOSIS — I872 Venous insufficiency (chronic) (peripheral): Secondary | ICD-10-CM | POA: Diagnosis not present

## 2020-07-31 DIAGNOSIS — I509 Heart failure, unspecified: Secondary | ICD-10-CM | POA: Diagnosis not present

## 2020-07-31 DIAGNOSIS — I10 Essential (primary) hypertension: Secondary | ICD-10-CM | POA: Diagnosis not present

## 2020-07-31 DIAGNOSIS — M6259 Muscle wasting and atrophy, not elsewhere classified, multiple sites: Secondary | ICD-10-CM | POA: Diagnosis not present

## 2020-07-31 DIAGNOSIS — I4891 Unspecified atrial fibrillation: Secondary | ICD-10-CM | POA: Diagnosis not present

## 2020-07-31 DIAGNOSIS — E7841 Elevated Lipoprotein(a): Secondary | ICD-10-CM | POA: Diagnosis not present

## 2020-07-31 DIAGNOSIS — L97821 Non-pressure chronic ulcer of other part of left lower leg limited to breakdown of skin: Secondary | ICD-10-CM | POA: Diagnosis not present

## 2020-07-31 DIAGNOSIS — L03116 Cellulitis of left lower limb: Secondary | ICD-10-CM | POA: Diagnosis not present

## 2020-08-02 DIAGNOSIS — Z7901 Long term (current) use of anticoagulants: Secondary | ICD-10-CM | POA: Diagnosis not present

## 2020-08-03 DIAGNOSIS — L97821 Non-pressure chronic ulcer of other part of left lower leg limited to breakdown of skin: Secondary | ICD-10-CM | POA: Diagnosis not present

## 2020-08-03 DIAGNOSIS — I872 Venous insufficiency (chronic) (peripheral): Secondary | ICD-10-CM | POA: Diagnosis not present

## 2020-08-03 DIAGNOSIS — E119 Type 2 diabetes mellitus without complications: Secondary | ICD-10-CM | POA: Diagnosis not present

## 2020-08-03 DIAGNOSIS — L03116 Cellulitis of left lower limb: Secondary | ICD-10-CM | POA: Diagnosis not present

## 2020-08-03 DIAGNOSIS — I10 Essential (primary) hypertension: Secondary | ICD-10-CM | POA: Diagnosis not present

## 2020-08-03 DIAGNOSIS — M6259 Muscle wasting and atrophy, not elsewhere classified, multiple sites: Secondary | ICD-10-CM | POA: Diagnosis not present

## 2020-08-03 DIAGNOSIS — E7841 Elevated Lipoprotein(a): Secondary | ICD-10-CM | POA: Diagnosis not present

## 2020-08-03 DIAGNOSIS — I509 Heart failure, unspecified: Secondary | ICD-10-CM | POA: Diagnosis not present

## 2020-08-03 DIAGNOSIS — I4891 Unspecified atrial fibrillation: Secondary | ICD-10-CM | POA: Diagnosis not present

## 2020-08-07 DIAGNOSIS — E7849 Other hyperlipidemia: Secondary | ICD-10-CM | POA: Diagnosis not present

## 2020-08-07 DIAGNOSIS — E119 Type 2 diabetes mellitus without complications: Secondary | ICD-10-CM | POA: Diagnosis not present

## 2020-08-07 DIAGNOSIS — I872 Venous insufficiency (chronic) (peripheral): Secondary | ICD-10-CM | POA: Diagnosis not present

## 2020-08-07 DIAGNOSIS — I509 Heart failure, unspecified: Secondary | ICD-10-CM | POA: Diagnosis not present

## 2020-08-07 DIAGNOSIS — L97811 Non-pressure chronic ulcer of other part of right lower leg limited to breakdown of skin: Secondary | ICD-10-CM | POA: Diagnosis not present

## 2020-08-07 DIAGNOSIS — E7841 Elevated Lipoprotein(a): Secondary | ICD-10-CM | POA: Diagnosis not present

## 2020-08-07 DIAGNOSIS — L03116 Cellulitis of left lower limb: Secondary | ICD-10-CM | POA: Diagnosis not present

## 2020-08-07 DIAGNOSIS — L97821 Non-pressure chronic ulcer of other part of left lower leg limited to breakdown of skin: Secondary | ICD-10-CM | POA: Diagnosis not present

## 2020-08-07 DIAGNOSIS — I10 Essential (primary) hypertension: Secondary | ICD-10-CM | POA: Diagnosis not present

## 2020-08-07 DIAGNOSIS — I4891 Unspecified atrial fibrillation: Secondary | ICD-10-CM | POA: Diagnosis not present

## 2020-08-09 DIAGNOSIS — R791 Abnormal coagulation profile: Secondary | ICD-10-CM | POA: Diagnosis not present

## 2020-08-10 DIAGNOSIS — L97811 Non-pressure chronic ulcer of other part of right lower leg limited to breakdown of skin: Secondary | ICD-10-CM | POA: Diagnosis not present

## 2020-08-10 DIAGNOSIS — E7841 Elevated Lipoprotein(a): Secondary | ICD-10-CM | POA: Diagnosis not present

## 2020-08-10 DIAGNOSIS — I872 Venous insufficiency (chronic) (peripheral): Secondary | ICD-10-CM | POA: Diagnosis not present

## 2020-08-10 DIAGNOSIS — Z7901 Long term (current) use of anticoagulants: Secondary | ICD-10-CM | POA: Diagnosis not present

## 2020-08-10 DIAGNOSIS — I4891 Unspecified atrial fibrillation: Secondary | ICD-10-CM | POA: Diagnosis not present

## 2020-08-10 DIAGNOSIS — I10 Essential (primary) hypertension: Secondary | ICD-10-CM | POA: Diagnosis not present

## 2020-08-10 DIAGNOSIS — L97821 Non-pressure chronic ulcer of other part of left lower leg limited to breakdown of skin: Secondary | ICD-10-CM | POA: Diagnosis not present

## 2020-08-10 DIAGNOSIS — I509 Heart failure, unspecified: Secondary | ICD-10-CM | POA: Diagnosis not present

## 2020-08-10 DIAGNOSIS — L03116 Cellulitis of left lower limb: Secondary | ICD-10-CM | POA: Diagnosis not present

## 2020-08-10 DIAGNOSIS — E119 Type 2 diabetes mellitus without complications: Secondary | ICD-10-CM | POA: Diagnosis not present

## 2020-08-15 DIAGNOSIS — I83013 Varicose veins of right lower extremity with ulcer of ankle: Secondary | ICD-10-CM | POA: Diagnosis not present

## 2020-08-15 DIAGNOSIS — I83023 Varicose veins of left lower extremity with ulcer of ankle: Secondary | ICD-10-CM | POA: Diagnosis not present

## 2020-08-16 DIAGNOSIS — I4891 Unspecified atrial fibrillation: Secondary | ICD-10-CM | POA: Diagnosis not present

## 2020-08-16 DIAGNOSIS — Z7901 Long term (current) use of anticoagulants: Secondary | ICD-10-CM | POA: Diagnosis not present

## 2020-08-18 DIAGNOSIS — I4891 Unspecified atrial fibrillation: Secondary | ICD-10-CM | POA: Diagnosis not present

## 2020-08-18 DIAGNOSIS — E7841 Elevated Lipoprotein(a): Secondary | ICD-10-CM | POA: Diagnosis not present

## 2020-08-18 DIAGNOSIS — I509 Heart failure, unspecified: Secondary | ICD-10-CM | POA: Diagnosis not present

## 2020-08-18 DIAGNOSIS — I10 Essential (primary) hypertension: Secondary | ICD-10-CM | POA: Diagnosis not present

## 2020-08-18 DIAGNOSIS — I872 Venous insufficiency (chronic) (peripheral): Secondary | ICD-10-CM | POA: Diagnosis not present

## 2020-08-18 DIAGNOSIS — E119 Type 2 diabetes mellitus without complications: Secondary | ICD-10-CM | POA: Diagnosis not present

## 2020-08-18 DIAGNOSIS — L97811 Non-pressure chronic ulcer of other part of right lower leg limited to breakdown of skin: Secondary | ICD-10-CM | POA: Diagnosis not present

## 2020-08-18 DIAGNOSIS — L03116 Cellulitis of left lower limb: Secondary | ICD-10-CM | POA: Diagnosis not present

## 2020-08-18 DIAGNOSIS — L97821 Non-pressure chronic ulcer of other part of left lower leg limited to breakdown of skin: Secondary | ICD-10-CM | POA: Diagnosis not present

## 2020-08-21 DIAGNOSIS — L97821 Non-pressure chronic ulcer of other part of left lower leg limited to breakdown of skin: Secondary | ICD-10-CM | POA: Diagnosis not present

## 2020-08-21 DIAGNOSIS — I872 Venous insufficiency (chronic) (peripheral): Secondary | ICD-10-CM | POA: Diagnosis not present

## 2020-08-21 DIAGNOSIS — E119 Type 2 diabetes mellitus without complications: Secondary | ICD-10-CM | POA: Diagnosis not present

## 2020-08-21 DIAGNOSIS — L03116 Cellulitis of left lower limb: Secondary | ICD-10-CM | POA: Diagnosis not present

## 2020-08-21 DIAGNOSIS — I4891 Unspecified atrial fibrillation: Secondary | ICD-10-CM | POA: Diagnosis not present

## 2020-08-21 DIAGNOSIS — L97811 Non-pressure chronic ulcer of other part of right lower leg limited to breakdown of skin: Secondary | ICD-10-CM | POA: Diagnosis not present

## 2020-08-21 DIAGNOSIS — I509 Heart failure, unspecified: Secondary | ICD-10-CM | POA: Diagnosis not present

## 2020-08-21 DIAGNOSIS — E7841 Elevated Lipoprotein(a): Secondary | ICD-10-CM | POA: Diagnosis not present

## 2020-08-21 DIAGNOSIS — I10 Essential (primary) hypertension: Secondary | ICD-10-CM | POA: Diagnosis not present

## 2020-08-23 DIAGNOSIS — Z7901 Long term (current) use of anticoagulants: Secondary | ICD-10-CM | POA: Diagnosis not present

## 2020-08-24 DIAGNOSIS — Z6833 Body mass index (BMI) 33.0-33.9, adult: Secondary | ICD-10-CM | POA: Diagnosis not present

## 2020-08-24 DIAGNOSIS — Z79899 Other long term (current) drug therapy: Secondary | ICD-10-CM | POA: Diagnosis not present

## 2020-08-24 DIAGNOSIS — Z1331 Encounter for screening for depression: Secondary | ICD-10-CM | POA: Diagnosis not present

## 2020-08-24 DIAGNOSIS — Z7901 Long term (current) use of anticoagulants: Secondary | ICD-10-CM | POA: Diagnosis not present

## 2020-08-24 DIAGNOSIS — R5383 Other fatigue: Secondary | ICD-10-CM | POA: Diagnosis not present

## 2020-08-24 DIAGNOSIS — E7800 Pure hypercholesterolemia, unspecified: Secondary | ICD-10-CM | POA: Diagnosis not present

## 2020-08-24 DIAGNOSIS — Z299 Encounter for prophylactic measures, unspecified: Secondary | ICD-10-CM | POA: Diagnosis not present

## 2020-08-24 DIAGNOSIS — Z7189 Other specified counseling: Secondary | ICD-10-CM | POA: Diagnosis not present

## 2020-08-24 DIAGNOSIS — E1165 Type 2 diabetes mellitus with hyperglycemia: Secondary | ICD-10-CM | POA: Diagnosis not present

## 2020-08-24 DIAGNOSIS — E559 Vitamin D deficiency, unspecified: Secondary | ICD-10-CM | POA: Diagnosis not present

## 2020-08-24 DIAGNOSIS — Z1339 Encounter for screening examination for other mental health and behavioral disorders: Secondary | ICD-10-CM | POA: Diagnosis not present

## 2020-08-24 DIAGNOSIS — Z0000 Encounter for general adult medical examination without abnormal findings: Secondary | ICD-10-CM | POA: Diagnosis not present

## 2020-08-24 DIAGNOSIS — I4891 Unspecified atrial fibrillation: Secondary | ICD-10-CM | POA: Diagnosis not present

## 2020-08-24 DIAGNOSIS — E119 Type 2 diabetes mellitus without complications: Secondary | ICD-10-CM | POA: Diagnosis not present

## 2020-08-24 DIAGNOSIS — E669 Obesity, unspecified: Secondary | ICD-10-CM | POA: Diagnosis not present

## 2020-08-28 DIAGNOSIS — L97811 Non-pressure chronic ulcer of other part of right lower leg limited to breakdown of skin: Secondary | ICD-10-CM | POA: Diagnosis not present

## 2020-08-28 DIAGNOSIS — E7841 Elevated Lipoprotein(a): Secondary | ICD-10-CM | POA: Diagnosis not present

## 2020-08-28 DIAGNOSIS — I10 Essential (primary) hypertension: Secondary | ICD-10-CM | POA: Diagnosis not present

## 2020-08-28 DIAGNOSIS — L03116 Cellulitis of left lower limb: Secondary | ICD-10-CM | POA: Diagnosis not present

## 2020-08-28 DIAGNOSIS — I4891 Unspecified atrial fibrillation: Secondary | ICD-10-CM | POA: Diagnosis not present

## 2020-08-28 DIAGNOSIS — I509 Heart failure, unspecified: Secondary | ICD-10-CM | POA: Diagnosis not present

## 2020-08-28 DIAGNOSIS — L97821 Non-pressure chronic ulcer of other part of left lower leg limited to breakdown of skin: Secondary | ICD-10-CM | POA: Diagnosis not present

## 2020-08-28 DIAGNOSIS — E119 Type 2 diabetes mellitus without complications: Secondary | ICD-10-CM | POA: Diagnosis not present

## 2020-08-28 DIAGNOSIS — I872 Venous insufficiency (chronic) (peripheral): Secondary | ICD-10-CM | POA: Diagnosis not present

## 2020-08-30 DIAGNOSIS — R791 Abnormal coagulation profile: Secondary | ICD-10-CM | POA: Diagnosis not present

## 2020-08-31 DIAGNOSIS — L03116 Cellulitis of left lower limb: Secondary | ICD-10-CM | POA: Diagnosis not present

## 2020-08-31 DIAGNOSIS — I872 Venous insufficiency (chronic) (peripheral): Secondary | ICD-10-CM | POA: Diagnosis not present

## 2020-08-31 DIAGNOSIS — I10 Essential (primary) hypertension: Secondary | ICD-10-CM | POA: Diagnosis not present

## 2020-08-31 DIAGNOSIS — E7841 Elevated Lipoprotein(a): Secondary | ICD-10-CM | POA: Diagnosis not present

## 2020-08-31 DIAGNOSIS — L97811 Non-pressure chronic ulcer of other part of right lower leg limited to breakdown of skin: Secondary | ICD-10-CM | POA: Diagnosis not present

## 2020-08-31 DIAGNOSIS — I4891 Unspecified atrial fibrillation: Secondary | ICD-10-CM | POA: Diagnosis not present

## 2020-08-31 DIAGNOSIS — L97821 Non-pressure chronic ulcer of other part of left lower leg limited to breakdown of skin: Secondary | ICD-10-CM | POA: Diagnosis not present

## 2020-08-31 DIAGNOSIS — I509 Heart failure, unspecified: Secondary | ICD-10-CM | POA: Diagnosis not present

## 2020-08-31 DIAGNOSIS — E119 Type 2 diabetes mellitus without complications: Secondary | ICD-10-CM | POA: Diagnosis not present

## 2020-09-04 DIAGNOSIS — L97811 Non-pressure chronic ulcer of other part of right lower leg limited to breakdown of skin: Secondary | ICD-10-CM | POA: Diagnosis not present

## 2020-09-04 DIAGNOSIS — I872 Venous insufficiency (chronic) (peripheral): Secondary | ICD-10-CM | POA: Diagnosis not present

## 2020-09-04 DIAGNOSIS — L97821 Non-pressure chronic ulcer of other part of left lower leg limited to breakdown of skin: Secondary | ICD-10-CM | POA: Diagnosis not present

## 2020-09-04 DIAGNOSIS — I509 Heart failure, unspecified: Secondary | ICD-10-CM | POA: Diagnosis not present

## 2020-09-04 DIAGNOSIS — L03116 Cellulitis of left lower limb: Secondary | ICD-10-CM | POA: Diagnosis not present

## 2020-09-04 DIAGNOSIS — E119 Type 2 diabetes mellitus without complications: Secondary | ICD-10-CM | POA: Diagnosis not present

## 2020-09-04 DIAGNOSIS — I4891 Unspecified atrial fibrillation: Secondary | ICD-10-CM | POA: Diagnosis not present

## 2020-09-04 DIAGNOSIS — E7841 Elevated Lipoprotein(a): Secondary | ICD-10-CM | POA: Diagnosis not present

## 2020-09-04 DIAGNOSIS — I10 Essential (primary) hypertension: Secondary | ICD-10-CM | POA: Diagnosis not present

## 2020-09-06 DIAGNOSIS — Z7901 Long term (current) use of anticoagulants: Secondary | ICD-10-CM | POA: Diagnosis not present

## 2020-09-06 DIAGNOSIS — I10 Essential (primary) hypertension: Secondary | ICD-10-CM | POA: Diagnosis not present

## 2020-09-06 DIAGNOSIS — E7849 Other hyperlipidemia: Secondary | ICD-10-CM | POA: Diagnosis not present

## 2020-09-06 DIAGNOSIS — E119 Type 2 diabetes mellitus without complications: Secondary | ICD-10-CM | POA: Diagnosis not present

## 2020-09-07 DIAGNOSIS — R300 Dysuria: Secondary | ICD-10-CM | POA: Diagnosis not present

## 2020-09-07 DIAGNOSIS — L03116 Cellulitis of left lower limb: Secondary | ICD-10-CM | POA: Diagnosis not present

## 2020-09-07 DIAGNOSIS — N390 Urinary tract infection, site not specified: Secondary | ICD-10-CM | POA: Diagnosis not present

## 2020-09-07 DIAGNOSIS — I509 Heart failure, unspecified: Secondary | ICD-10-CM | POA: Diagnosis not present

## 2020-09-07 DIAGNOSIS — L97811 Non-pressure chronic ulcer of other part of right lower leg limited to breakdown of skin: Secondary | ICD-10-CM | POA: Diagnosis not present

## 2020-09-07 DIAGNOSIS — L97821 Non-pressure chronic ulcer of other part of left lower leg limited to breakdown of skin: Secondary | ICD-10-CM | POA: Diagnosis not present

## 2020-09-07 DIAGNOSIS — E119 Type 2 diabetes mellitus without complications: Secondary | ICD-10-CM | POA: Diagnosis not present

## 2020-09-07 DIAGNOSIS — I4891 Unspecified atrial fibrillation: Secondary | ICD-10-CM | POA: Diagnosis not present

## 2020-09-07 DIAGNOSIS — I872 Venous insufficiency (chronic) (peripheral): Secondary | ICD-10-CM | POA: Diagnosis not present

## 2020-09-07 DIAGNOSIS — I10 Essential (primary) hypertension: Secondary | ICD-10-CM | POA: Diagnosis not present

## 2020-09-07 DIAGNOSIS — Z7901 Long term (current) use of anticoagulants: Secondary | ICD-10-CM | POA: Diagnosis not present

## 2020-09-07 DIAGNOSIS — E7841 Elevated Lipoprotein(a): Secondary | ICD-10-CM | POA: Diagnosis not present

## 2020-09-08 DIAGNOSIS — L03116 Cellulitis of left lower limb: Secondary | ICD-10-CM | POA: Diagnosis not present

## 2020-09-08 DIAGNOSIS — I10 Essential (primary) hypertension: Secondary | ICD-10-CM | POA: Diagnosis not present

## 2020-09-08 DIAGNOSIS — L97821 Non-pressure chronic ulcer of other part of left lower leg limited to breakdown of skin: Secondary | ICD-10-CM | POA: Diagnosis not present

## 2020-09-08 DIAGNOSIS — E7841 Elevated Lipoprotein(a): Secondary | ICD-10-CM | POA: Diagnosis not present

## 2020-09-08 DIAGNOSIS — I509 Heart failure, unspecified: Secondary | ICD-10-CM | POA: Diagnosis not present

## 2020-09-08 DIAGNOSIS — E119 Type 2 diabetes mellitus without complications: Secondary | ICD-10-CM | POA: Diagnosis not present

## 2020-09-08 DIAGNOSIS — L97811 Non-pressure chronic ulcer of other part of right lower leg limited to breakdown of skin: Secondary | ICD-10-CM | POA: Diagnosis not present

## 2020-09-08 DIAGNOSIS — I4891 Unspecified atrial fibrillation: Secondary | ICD-10-CM | POA: Diagnosis not present

## 2020-09-08 DIAGNOSIS — I872 Venous insufficiency (chronic) (peripheral): Secondary | ICD-10-CM | POA: Diagnosis not present

## 2020-09-12 DIAGNOSIS — L03121 Acute lymphangitis of right axilla: Secondary | ICD-10-CM | POA: Diagnosis not present

## 2020-09-12 DIAGNOSIS — I83023 Varicose veins of left lower extremity with ulcer of ankle: Secondary | ICD-10-CM | POA: Diagnosis not present

## 2020-09-12 DIAGNOSIS — R601 Generalized edema: Secondary | ICD-10-CM | POA: Diagnosis not present

## 2020-09-12 DIAGNOSIS — I83013 Varicose veins of right lower extremity with ulcer of ankle: Secondary | ICD-10-CM | POA: Diagnosis not present

## 2020-09-12 DIAGNOSIS — L03122 Acute lymphangitis of left axilla: Secondary | ICD-10-CM | POA: Diagnosis not present

## 2020-09-13 DIAGNOSIS — Z7901 Long term (current) use of anticoagulants: Secondary | ICD-10-CM | POA: Diagnosis not present

## 2020-09-14 DIAGNOSIS — I872 Venous insufficiency (chronic) (peripheral): Secondary | ICD-10-CM | POA: Diagnosis not present

## 2020-09-14 DIAGNOSIS — L97821 Non-pressure chronic ulcer of other part of left lower leg limited to breakdown of skin: Secondary | ICD-10-CM | POA: Diagnosis not present

## 2020-09-14 DIAGNOSIS — I10 Essential (primary) hypertension: Secondary | ICD-10-CM | POA: Diagnosis not present

## 2020-09-14 DIAGNOSIS — L03116 Cellulitis of left lower limb: Secondary | ICD-10-CM | POA: Diagnosis not present

## 2020-09-14 DIAGNOSIS — I4891 Unspecified atrial fibrillation: Secondary | ICD-10-CM | POA: Diagnosis not present

## 2020-09-14 DIAGNOSIS — L97811 Non-pressure chronic ulcer of other part of right lower leg limited to breakdown of skin: Secondary | ICD-10-CM | POA: Diagnosis not present

## 2020-09-14 DIAGNOSIS — E119 Type 2 diabetes mellitus without complications: Secondary | ICD-10-CM | POA: Diagnosis not present

## 2020-09-14 DIAGNOSIS — E7841 Elevated Lipoprotein(a): Secondary | ICD-10-CM | POA: Diagnosis not present

## 2020-09-14 DIAGNOSIS — I509 Heart failure, unspecified: Secondary | ICD-10-CM | POA: Diagnosis not present

## 2020-09-18 DIAGNOSIS — I4891 Unspecified atrial fibrillation: Secondary | ICD-10-CM | POA: Diagnosis not present

## 2020-09-18 DIAGNOSIS — L97821 Non-pressure chronic ulcer of other part of left lower leg limited to breakdown of skin: Secondary | ICD-10-CM | POA: Diagnosis not present

## 2020-09-18 DIAGNOSIS — L03116 Cellulitis of left lower limb: Secondary | ICD-10-CM | POA: Diagnosis not present

## 2020-09-18 DIAGNOSIS — E7841 Elevated Lipoprotein(a): Secondary | ICD-10-CM | POA: Diagnosis not present

## 2020-09-18 DIAGNOSIS — I872 Venous insufficiency (chronic) (peripheral): Secondary | ICD-10-CM | POA: Diagnosis not present

## 2020-09-18 DIAGNOSIS — I509 Heart failure, unspecified: Secondary | ICD-10-CM | POA: Diagnosis not present

## 2020-09-18 DIAGNOSIS — I10 Essential (primary) hypertension: Secondary | ICD-10-CM | POA: Diagnosis not present

## 2020-09-18 DIAGNOSIS — L97811 Non-pressure chronic ulcer of other part of right lower leg limited to breakdown of skin: Secondary | ICD-10-CM | POA: Diagnosis not present

## 2020-09-18 DIAGNOSIS — E119 Type 2 diabetes mellitus without complications: Secondary | ICD-10-CM | POA: Diagnosis not present

## 2020-09-20 DIAGNOSIS — E119 Type 2 diabetes mellitus without complications: Secondary | ICD-10-CM | POA: Diagnosis not present

## 2020-09-20 DIAGNOSIS — R791 Abnormal coagulation profile: Secondary | ICD-10-CM | POA: Diagnosis not present

## 2020-09-20 DIAGNOSIS — D510 Vitamin B12 deficiency anemia due to intrinsic factor deficiency: Secondary | ICD-10-CM | POA: Diagnosis not present

## 2020-09-20 DIAGNOSIS — Z7901 Long term (current) use of anticoagulants: Secondary | ICD-10-CM | POA: Diagnosis not present

## 2020-09-21 DIAGNOSIS — I10 Essential (primary) hypertension: Secondary | ICD-10-CM | POA: Diagnosis not present

## 2020-09-21 DIAGNOSIS — I509 Heart failure, unspecified: Secondary | ICD-10-CM | POA: Diagnosis not present

## 2020-09-21 DIAGNOSIS — I872 Venous insufficiency (chronic) (peripheral): Secondary | ICD-10-CM | POA: Diagnosis not present

## 2020-09-21 DIAGNOSIS — Z7901 Long term (current) use of anticoagulants: Secondary | ICD-10-CM | POA: Diagnosis not present

## 2020-09-21 DIAGNOSIS — E7841 Elevated Lipoprotein(a): Secondary | ICD-10-CM | POA: Diagnosis not present

## 2020-09-21 DIAGNOSIS — I4891 Unspecified atrial fibrillation: Secondary | ICD-10-CM | POA: Diagnosis not present

## 2020-09-21 DIAGNOSIS — L97811 Non-pressure chronic ulcer of other part of right lower leg limited to breakdown of skin: Secondary | ICD-10-CM | POA: Diagnosis not present

## 2020-09-21 DIAGNOSIS — L97821 Non-pressure chronic ulcer of other part of left lower leg limited to breakdown of skin: Secondary | ICD-10-CM | POA: Diagnosis not present

## 2020-09-21 DIAGNOSIS — L03116 Cellulitis of left lower limb: Secondary | ICD-10-CM | POA: Diagnosis not present

## 2020-09-21 DIAGNOSIS — E119 Type 2 diabetes mellitus without complications: Secondary | ICD-10-CM | POA: Diagnosis not present

## 2020-09-21 DIAGNOSIS — B373 Candidiasis of vulva and vagina: Secondary | ICD-10-CM | POA: Diagnosis not present

## 2020-09-25 DIAGNOSIS — I10 Essential (primary) hypertension: Secondary | ICD-10-CM | POA: Diagnosis not present

## 2020-09-25 DIAGNOSIS — L03116 Cellulitis of left lower limb: Secondary | ICD-10-CM | POA: Diagnosis not present

## 2020-09-25 DIAGNOSIS — L97811 Non-pressure chronic ulcer of other part of right lower leg limited to breakdown of skin: Secondary | ICD-10-CM | POA: Diagnosis not present

## 2020-09-25 DIAGNOSIS — E119 Type 2 diabetes mellitus without complications: Secondary | ICD-10-CM | POA: Diagnosis not present

## 2020-09-25 DIAGNOSIS — I509 Heart failure, unspecified: Secondary | ICD-10-CM | POA: Diagnosis not present

## 2020-09-25 DIAGNOSIS — I4891 Unspecified atrial fibrillation: Secondary | ICD-10-CM | POA: Diagnosis not present

## 2020-09-25 DIAGNOSIS — L97821 Non-pressure chronic ulcer of other part of left lower leg limited to breakdown of skin: Secondary | ICD-10-CM | POA: Diagnosis not present

## 2020-09-25 DIAGNOSIS — I872 Venous insufficiency (chronic) (peripheral): Secondary | ICD-10-CM | POA: Diagnosis not present

## 2020-09-25 DIAGNOSIS — E7841 Elevated Lipoprotein(a): Secondary | ICD-10-CM | POA: Diagnosis not present

## 2020-09-27 DIAGNOSIS — E119 Type 2 diabetes mellitus without complications: Secondary | ICD-10-CM | POA: Diagnosis not present

## 2020-09-27 DIAGNOSIS — Z7901 Long term (current) use of anticoagulants: Secondary | ICD-10-CM | POA: Diagnosis not present

## 2020-09-27 DIAGNOSIS — E559 Vitamin D deficiency, unspecified: Secondary | ICD-10-CM | POA: Diagnosis not present

## 2020-09-28 DIAGNOSIS — L03116 Cellulitis of left lower limb: Secondary | ICD-10-CM | POA: Diagnosis not present

## 2020-09-28 DIAGNOSIS — Z794 Long term (current) use of insulin: Secondary | ICD-10-CM | POA: Diagnosis not present

## 2020-09-28 DIAGNOSIS — I10 Essential (primary) hypertension: Secondary | ICD-10-CM | POA: Diagnosis not present

## 2020-09-28 DIAGNOSIS — L97811 Non-pressure chronic ulcer of other part of right lower leg limited to breakdown of skin: Secondary | ICD-10-CM | POA: Diagnosis not present

## 2020-09-28 DIAGNOSIS — I4891 Unspecified atrial fibrillation: Secondary | ICD-10-CM | POA: Diagnosis not present

## 2020-09-28 DIAGNOSIS — E119 Type 2 diabetes mellitus without complications: Secondary | ICD-10-CM | POA: Diagnosis not present

## 2020-09-28 DIAGNOSIS — I872 Venous insufficiency (chronic) (peripheral): Secondary | ICD-10-CM | POA: Diagnosis not present

## 2020-09-28 DIAGNOSIS — Z7901 Long term (current) use of anticoagulants: Secondary | ICD-10-CM | POA: Diagnosis not present

## 2020-09-28 DIAGNOSIS — I482 Chronic atrial fibrillation, unspecified: Secondary | ICD-10-CM | POA: Diagnosis not present

## 2020-09-28 DIAGNOSIS — I509 Heart failure, unspecified: Secondary | ICD-10-CM | POA: Diagnosis not present

## 2020-09-28 DIAGNOSIS — L97821 Non-pressure chronic ulcer of other part of left lower leg limited to breakdown of skin: Secondary | ICD-10-CM | POA: Diagnosis not present

## 2020-09-28 DIAGNOSIS — E7841 Elevated Lipoprotein(a): Secondary | ICD-10-CM | POA: Diagnosis not present

## 2020-10-02 DIAGNOSIS — L97811 Non-pressure chronic ulcer of other part of right lower leg limited to breakdown of skin: Secondary | ICD-10-CM | POA: Diagnosis not present

## 2020-10-02 DIAGNOSIS — I872 Venous insufficiency (chronic) (peripheral): Secondary | ICD-10-CM | POA: Diagnosis not present

## 2020-10-02 DIAGNOSIS — E7841 Elevated Lipoprotein(a): Secondary | ICD-10-CM | POA: Diagnosis not present

## 2020-10-02 DIAGNOSIS — I509 Heart failure, unspecified: Secondary | ICD-10-CM | POA: Diagnosis not present

## 2020-10-02 DIAGNOSIS — I4891 Unspecified atrial fibrillation: Secondary | ICD-10-CM | POA: Diagnosis not present

## 2020-10-02 DIAGNOSIS — E119 Type 2 diabetes mellitus without complications: Secondary | ICD-10-CM | POA: Diagnosis not present

## 2020-10-02 DIAGNOSIS — I10 Essential (primary) hypertension: Secondary | ICD-10-CM | POA: Diagnosis not present

## 2020-10-02 DIAGNOSIS — L03116 Cellulitis of left lower limb: Secondary | ICD-10-CM | POA: Diagnosis not present

## 2020-10-02 DIAGNOSIS — L97821 Non-pressure chronic ulcer of other part of left lower leg limited to breakdown of skin: Secondary | ICD-10-CM | POA: Diagnosis not present

## 2020-10-04 DIAGNOSIS — Z7901 Long term (current) use of anticoagulants: Secondary | ICD-10-CM | POA: Diagnosis not present

## 2020-10-05 DIAGNOSIS — E7841 Elevated Lipoprotein(a): Secondary | ICD-10-CM | POA: Diagnosis not present

## 2020-10-05 DIAGNOSIS — E119 Type 2 diabetes mellitus without complications: Secondary | ICD-10-CM | POA: Diagnosis not present

## 2020-10-05 DIAGNOSIS — L03116 Cellulitis of left lower limb: Secondary | ICD-10-CM | POA: Diagnosis not present

## 2020-10-05 DIAGNOSIS — I4891 Unspecified atrial fibrillation: Secondary | ICD-10-CM | POA: Diagnosis not present

## 2020-10-05 DIAGNOSIS — Z7901 Long term (current) use of anticoagulants: Secondary | ICD-10-CM | POA: Diagnosis not present

## 2020-10-05 DIAGNOSIS — L97811 Non-pressure chronic ulcer of other part of right lower leg limited to breakdown of skin: Secondary | ICD-10-CM | POA: Diagnosis not present

## 2020-10-05 DIAGNOSIS — I509 Heart failure, unspecified: Secondary | ICD-10-CM | POA: Diagnosis not present

## 2020-10-05 DIAGNOSIS — L97821 Non-pressure chronic ulcer of other part of left lower leg limited to breakdown of skin: Secondary | ICD-10-CM | POA: Diagnosis not present

## 2020-10-05 DIAGNOSIS — I872 Venous insufficiency (chronic) (peripheral): Secondary | ICD-10-CM | POA: Diagnosis not present

## 2020-10-05 DIAGNOSIS — I10 Essential (primary) hypertension: Secondary | ICD-10-CM | POA: Diagnosis not present

## 2020-10-07 DIAGNOSIS — I10 Essential (primary) hypertension: Secondary | ICD-10-CM | POA: Diagnosis not present

## 2020-10-07 DIAGNOSIS — E7849 Other hyperlipidemia: Secondary | ICD-10-CM | POA: Diagnosis not present

## 2020-10-07 DIAGNOSIS — E119 Type 2 diabetes mellitus without complications: Secondary | ICD-10-CM | POA: Diagnosis not present

## 2020-10-10 DIAGNOSIS — I872 Venous insufficiency (chronic) (peripheral): Secondary | ICD-10-CM | POA: Diagnosis not present

## 2020-10-10 DIAGNOSIS — L03116 Cellulitis of left lower limb: Secondary | ICD-10-CM | POA: Diagnosis not present

## 2020-10-10 DIAGNOSIS — E7841 Elevated Lipoprotein(a): Secondary | ICD-10-CM | POA: Diagnosis not present

## 2020-10-10 DIAGNOSIS — E119 Type 2 diabetes mellitus without complications: Secondary | ICD-10-CM | POA: Diagnosis not present

## 2020-10-10 DIAGNOSIS — I10 Essential (primary) hypertension: Secondary | ICD-10-CM | POA: Diagnosis not present

## 2020-10-10 DIAGNOSIS — L97811 Non-pressure chronic ulcer of other part of right lower leg limited to breakdown of skin: Secondary | ICD-10-CM | POA: Diagnosis not present

## 2020-10-10 DIAGNOSIS — L97821 Non-pressure chronic ulcer of other part of left lower leg limited to breakdown of skin: Secondary | ICD-10-CM | POA: Diagnosis not present

## 2020-10-10 DIAGNOSIS — I509 Heart failure, unspecified: Secondary | ICD-10-CM | POA: Diagnosis not present

## 2020-10-10 DIAGNOSIS — I4891 Unspecified atrial fibrillation: Secondary | ICD-10-CM | POA: Diagnosis not present

## 2020-10-11 DIAGNOSIS — Z7901 Long term (current) use of anticoagulants: Secondary | ICD-10-CM | POA: Diagnosis not present

## 2020-10-12 DIAGNOSIS — E7841 Elevated Lipoprotein(a): Secondary | ICD-10-CM | POA: Diagnosis not present

## 2020-10-12 DIAGNOSIS — E119 Type 2 diabetes mellitus without complications: Secondary | ICD-10-CM | POA: Diagnosis not present

## 2020-10-12 DIAGNOSIS — L97811 Non-pressure chronic ulcer of other part of right lower leg limited to breakdown of skin: Secondary | ICD-10-CM | POA: Diagnosis not present

## 2020-10-12 DIAGNOSIS — L97821 Non-pressure chronic ulcer of other part of left lower leg limited to breakdown of skin: Secondary | ICD-10-CM | POA: Diagnosis not present

## 2020-10-12 DIAGNOSIS — L03116 Cellulitis of left lower limb: Secondary | ICD-10-CM | POA: Diagnosis not present

## 2020-10-12 DIAGNOSIS — I10 Essential (primary) hypertension: Secondary | ICD-10-CM | POA: Diagnosis not present

## 2020-10-12 DIAGNOSIS — J309 Allergic rhinitis, unspecified: Secondary | ICD-10-CM | POA: Diagnosis not present

## 2020-10-12 DIAGNOSIS — I509 Heart failure, unspecified: Secondary | ICD-10-CM | POA: Diagnosis not present

## 2020-10-12 DIAGNOSIS — I872 Venous insufficiency (chronic) (peripheral): Secondary | ICD-10-CM | POA: Diagnosis not present

## 2020-10-12 DIAGNOSIS — I4891 Unspecified atrial fibrillation: Secondary | ICD-10-CM | POA: Diagnosis not present

## 2020-10-16 DIAGNOSIS — I509 Heart failure, unspecified: Secondary | ICD-10-CM | POA: Diagnosis not present

## 2020-10-16 DIAGNOSIS — L97811 Non-pressure chronic ulcer of other part of right lower leg limited to breakdown of skin: Secondary | ICD-10-CM | POA: Diagnosis not present

## 2020-10-16 DIAGNOSIS — I10 Essential (primary) hypertension: Secondary | ICD-10-CM | POA: Diagnosis not present

## 2020-10-16 DIAGNOSIS — I872 Venous insufficiency (chronic) (peripheral): Secondary | ICD-10-CM | POA: Diagnosis not present

## 2020-10-16 DIAGNOSIS — L03116 Cellulitis of left lower limb: Secondary | ICD-10-CM | POA: Diagnosis not present

## 2020-10-16 DIAGNOSIS — L97821 Non-pressure chronic ulcer of other part of left lower leg limited to breakdown of skin: Secondary | ICD-10-CM | POA: Diagnosis not present

## 2020-10-16 DIAGNOSIS — E119 Type 2 diabetes mellitus without complications: Secondary | ICD-10-CM | POA: Diagnosis not present

## 2020-10-16 DIAGNOSIS — E7841 Elevated Lipoprotein(a): Secondary | ICD-10-CM | POA: Diagnosis not present

## 2020-10-16 DIAGNOSIS — I4891 Unspecified atrial fibrillation: Secondary | ICD-10-CM | POA: Diagnosis not present

## 2020-10-18 DIAGNOSIS — Z7901 Long term (current) use of anticoagulants: Secondary | ICD-10-CM | POA: Diagnosis not present

## 2020-10-19 DIAGNOSIS — I10 Essential (primary) hypertension: Secondary | ICD-10-CM | POA: Diagnosis not present

## 2020-10-19 DIAGNOSIS — I4891 Unspecified atrial fibrillation: Secondary | ICD-10-CM | POA: Diagnosis not present

## 2020-10-19 DIAGNOSIS — Z7901 Long term (current) use of anticoagulants: Secondary | ICD-10-CM | POA: Diagnosis not present

## 2020-10-19 DIAGNOSIS — L02214 Cutaneous abscess of groin: Secondary | ICD-10-CM | POA: Diagnosis not present

## 2020-10-19 DIAGNOSIS — N764 Abscess of vulva: Secondary | ICD-10-CM | POA: Diagnosis not present

## 2020-10-23 DIAGNOSIS — M810 Age-related osteoporosis without current pathological fracture: Secondary | ICD-10-CM | POA: Diagnosis not present

## 2020-10-23 DIAGNOSIS — E119 Type 2 diabetes mellitus without complications: Secondary | ICD-10-CM | POA: Diagnosis not present

## 2020-10-23 DIAGNOSIS — I509 Heart failure, unspecified: Secondary | ICD-10-CM | POA: Diagnosis not present

## 2020-10-23 DIAGNOSIS — I10 Essential (primary) hypertension: Secondary | ICD-10-CM | POA: Diagnosis not present

## 2020-10-23 DIAGNOSIS — L97821 Non-pressure chronic ulcer of other part of left lower leg limited to breakdown of skin: Secondary | ICD-10-CM | POA: Diagnosis not present

## 2020-10-23 DIAGNOSIS — E7841 Elevated Lipoprotein(a): Secondary | ICD-10-CM | POA: Diagnosis not present

## 2020-10-23 DIAGNOSIS — L03116 Cellulitis of left lower limb: Secondary | ICD-10-CM | POA: Diagnosis not present

## 2020-10-23 DIAGNOSIS — I872 Venous insufficiency (chronic) (peripheral): Secondary | ICD-10-CM | POA: Diagnosis not present

## 2020-10-23 DIAGNOSIS — I4891 Unspecified atrial fibrillation: Secondary | ICD-10-CM | POA: Diagnosis not present

## 2020-10-23 DIAGNOSIS — L97811 Non-pressure chronic ulcer of other part of right lower leg limited to breakdown of skin: Secondary | ICD-10-CM | POA: Diagnosis not present

## 2020-10-26 DIAGNOSIS — I4891 Unspecified atrial fibrillation: Secondary | ICD-10-CM | POA: Diagnosis not present

## 2020-10-26 DIAGNOSIS — I872 Venous insufficiency (chronic) (peripheral): Secondary | ICD-10-CM | POA: Diagnosis not present

## 2020-10-26 DIAGNOSIS — L97811 Non-pressure chronic ulcer of other part of right lower leg limited to breakdown of skin: Secondary | ICD-10-CM | POA: Diagnosis not present

## 2020-10-26 DIAGNOSIS — L03116 Cellulitis of left lower limb: Secondary | ICD-10-CM | POA: Diagnosis not present

## 2020-10-26 DIAGNOSIS — I509 Heart failure, unspecified: Secondary | ICD-10-CM | POA: Diagnosis not present

## 2020-10-26 DIAGNOSIS — L97821 Non-pressure chronic ulcer of other part of left lower leg limited to breakdown of skin: Secondary | ICD-10-CM | POA: Diagnosis not present

## 2020-10-26 DIAGNOSIS — E7841 Elevated Lipoprotein(a): Secondary | ICD-10-CM | POA: Diagnosis not present

## 2020-10-26 DIAGNOSIS — I10 Essential (primary) hypertension: Secondary | ICD-10-CM | POA: Diagnosis not present

## 2020-10-26 DIAGNOSIS — E119 Type 2 diabetes mellitus without complications: Secondary | ICD-10-CM | POA: Diagnosis not present

## 2020-10-30 DIAGNOSIS — I872 Venous insufficiency (chronic) (peripheral): Secondary | ICD-10-CM | POA: Diagnosis not present

## 2020-10-30 DIAGNOSIS — I10 Essential (primary) hypertension: Secondary | ICD-10-CM | POA: Diagnosis not present

## 2020-10-30 DIAGNOSIS — E119 Type 2 diabetes mellitus without complications: Secondary | ICD-10-CM | POA: Diagnosis not present

## 2020-10-30 DIAGNOSIS — I4891 Unspecified atrial fibrillation: Secondary | ICD-10-CM | POA: Diagnosis not present

## 2020-10-30 DIAGNOSIS — I509 Heart failure, unspecified: Secondary | ICD-10-CM | POA: Diagnosis not present

## 2020-10-30 DIAGNOSIS — L03116 Cellulitis of left lower limb: Secondary | ICD-10-CM | POA: Diagnosis not present

## 2020-10-30 DIAGNOSIS — L97821 Non-pressure chronic ulcer of other part of left lower leg limited to breakdown of skin: Secondary | ICD-10-CM | POA: Diagnosis not present

## 2020-10-30 DIAGNOSIS — E7841 Elevated Lipoprotein(a): Secondary | ICD-10-CM | POA: Diagnosis not present

## 2020-10-30 DIAGNOSIS — L97811 Non-pressure chronic ulcer of other part of right lower leg limited to breakdown of skin: Secondary | ICD-10-CM | POA: Diagnosis not present

## 2021-01-23 DIAGNOSIS — I83023 Varicose veins of left lower extremity with ulcer of ankle: Secondary | ICD-10-CM | POA: Diagnosis not present

## 2021-01-23 DIAGNOSIS — M79673 Pain in unspecified foot: Secondary | ICD-10-CM | POA: Diagnosis not present

## 2021-01-23 DIAGNOSIS — I83013 Varicose veins of right lower extremity with ulcer of ankle: Secondary | ICD-10-CM | POA: Diagnosis not present

## 2021-01-24 DIAGNOSIS — Z7901 Long term (current) use of anticoagulants: Secondary | ICD-10-CM | POA: Diagnosis not present

## 2021-01-25 DIAGNOSIS — I4891 Unspecified atrial fibrillation: Secondary | ICD-10-CM | POA: Diagnosis not present

## 2021-01-25 DIAGNOSIS — Z7901 Long term (current) use of anticoagulants: Secondary | ICD-10-CM | POA: Diagnosis not present

## 2021-01-25 DIAGNOSIS — Z418 Encounter for other procedures for purposes other than remedying health state: Secondary | ICD-10-CM | POA: Diagnosis not present

## 2021-02-02 DIAGNOSIS — Z7901 Long term (current) use of anticoagulants: Secondary | ICD-10-CM | POA: Diagnosis not present

## 2021-02-04 DIAGNOSIS — E119 Type 2 diabetes mellitus without complications: Secondary | ICD-10-CM | POA: Diagnosis not present

## 2021-02-04 DIAGNOSIS — I10 Essential (primary) hypertension: Secondary | ICD-10-CM | POA: Diagnosis not present

## 2021-02-04 DIAGNOSIS — E7849 Other hyperlipidemia: Secondary | ICD-10-CM | POA: Diagnosis not present

## 2021-02-08 DIAGNOSIS — Z7901 Long term (current) use of anticoagulants: Secondary | ICD-10-CM | POA: Diagnosis not present

## 2021-02-08 DIAGNOSIS — E119 Type 2 diabetes mellitus without complications: Secondary | ICD-10-CM | POA: Diagnosis not present

## 2021-02-08 DIAGNOSIS — I4891 Unspecified atrial fibrillation: Secondary | ICD-10-CM | POA: Diagnosis not present

## 2021-02-15 DIAGNOSIS — Z7901 Long term (current) use of anticoagulants: Secondary | ICD-10-CM | POA: Diagnosis not present

## 2021-02-15 DIAGNOSIS — E119 Type 2 diabetes mellitus without complications: Secondary | ICD-10-CM | POA: Diagnosis not present

## 2021-02-15 DIAGNOSIS — I4891 Unspecified atrial fibrillation: Secondary | ICD-10-CM | POA: Diagnosis not present

## 2021-02-20 DIAGNOSIS — M79672 Pain in left foot: Secondary | ICD-10-CM | POA: Diagnosis not present

## 2021-02-20 DIAGNOSIS — M84372D Stress fracture, left ankle, subsequent encounter for fracture with routine healing: Secondary | ICD-10-CM | POA: Diagnosis not present

## 2021-02-21 DIAGNOSIS — Z7901 Long term (current) use of anticoagulants: Secondary | ICD-10-CM | POA: Diagnosis not present

## 2021-02-22 DIAGNOSIS — Z7901 Long term (current) use of anticoagulants: Secondary | ICD-10-CM | POA: Diagnosis not present

## 2021-02-22 DIAGNOSIS — I4891 Unspecified atrial fibrillation: Secondary | ICD-10-CM | POA: Diagnosis not present

## 2021-02-26 DIAGNOSIS — I4891 Unspecified atrial fibrillation: Secondary | ICD-10-CM | POA: Diagnosis not present

## 2021-02-27 DIAGNOSIS — R197 Diarrhea, unspecified: Secondary | ICD-10-CM | POA: Diagnosis not present

## 2021-02-27 DIAGNOSIS — I4891 Unspecified atrial fibrillation: Secondary | ICD-10-CM | POA: Diagnosis not present

## 2021-02-27 DIAGNOSIS — Z7901 Long term (current) use of anticoagulants: Secondary | ICD-10-CM | POA: Diagnosis not present

## 2021-03-07 DIAGNOSIS — E119 Type 2 diabetes mellitus without complications: Secondary | ICD-10-CM | POA: Diagnosis not present

## 2021-03-07 DIAGNOSIS — E7849 Other hyperlipidemia: Secondary | ICD-10-CM | POA: Diagnosis not present

## 2021-03-07 DIAGNOSIS — I10 Essential (primary) hypertension: Secondary | ICD-10-CM | POA: Diagnosis not present

## 2021-03-07 DIAGNOSIS — Z7901 Long term (current) use of anticoagulants: Secondary | ICD-10-CM | POA: Diagnosis not present

## 2021-03-07 DIAGNOSIS — R197 Diarrhea, unspecified: Secondary | ICD-10-CM | POA: Diagnosis not present

## 2021-03-07 DIAGNOSIS — E559 Vitamin D deficiency, unspecified: Secondary | ICD-10-CM | POA: Diagnosis not present

## 2021-03-07 DIAGNOSIS — D649 Anemia, unspecified: Secondary | ICD-10-CM | POA: Diagnosis not present

## 2021-03-07 DIAGNOSIS — D519 Vitamin B12 deficiency anemia, unspecified: Secondary | ICD-10-CM | POA: Diagnosis not present

## 2021-03-08 DIAGNOSIS — Z7901 Long term (current) use of anticoagulants: Secondary | ICD-10-CM | POA: Diagnosis not present

## 2021-03-08 DIAGNOSIS — A0471 Enterocolitis due to Clostridium difficile, recurrent: Secondary | ICD-10-CM | POA: Diagnosis not present

## 2021-03-08 DIAGNOSIS — I4891 Unspecified atrial fibrillation: Secondary | ICD-10-CM | POA: Diagnosis not present

## 2021-03-14 DIAGNOSIS — Z7901 Long term (current) use of anticoagulants: Secondary | ICD-10-CM | POA: Diagnosis not present

## 2021-03-21 DIAGNOSIS — Z7901 Long term (current) use of anticoagulants: Secondary | ICD-10-CM | POA: Diagnosis not present

## 2021-03-22 DIAGNOSIS — I509 Heart failure, unspecified: Secondary | ICD-10-CM | POA: Diagnosis not present

## 2021-03-22 DIAGNOSIS — Z7901 Long term (current) use of anticoagulants: Secondary | ICD-10-CM | POA: Diagnosis not present

## 2021-03-22 DIAGNOSIS — M810 Age-related osteoporosis without current pathological fracture: Secondary | ICD-10-CM | POA: Diagnosis not present

## 2021-03-22 DIAGNOSIS — I4891 Unspecified atrial fibrillation: Secondary | ICD-10-CM | POA: Diagnosis not present

## 2021-03-22 DIAGNOSIS — E119 Type 2 diabetes mellitus without complications: Secondary | ICD-10-CM | POA: Diagnosis not present

## 2021-03-22 DIAGNOSIS — E11622 Type 2 diabetes mellitus with other skin ulcer: Secondary | ICD-10-CM | POA: Diagnosis not present

## 2021-03-22 DIAGNOSIS — I10 Essential (primary) hypertension: Secondary | ICD-10-CM | POA: Diagnosis not present

## 2021-03-28 DIAGNOSIS — M79672 Pain in left foot: Secondary | ICD-10-CM | POA: Diagnosis not present

## 2021-03-28 DIAGNOSIS — R609 Edema, unspecified: Secondary | ICD-10-CM | POA: Diagnosis not present

## 2021-03-28 DIAGNOSIS — Z7401 Bed confinement status: Secondary | ICD-10-CM | POA: Diagnosis not present

## 2021-03-28 DIAGNOSIS — I110 Hypertensive heart disease with heart failure: Secondary | ICD-10-CM | POA: Diagnosis not present

## 2021-03-28 DIAGNOSIS — M79669 Pain in unspecified lower leg: Secondary | ICD-10-CM | POA: Diagnosis not present

## 2021-03-28 DIAGNOSIS — M79605 Pain in left leg: Secondary | ICD-10-CM | POA: Diagnosis not present

## 2021-03-28 DIAGNOSIS — I4891 Unspecified atrial fibrillation: Secondary | ICD-10-CM | POA: Diagnosis not present

## 2021-03-28 DIAGNOSIS — I252 Old myocardial infarction: Secondary | ICD-10-CM | POA: Diagnosis not present

## 2021-03-28 DIAGNOSIS — I509 Heart failure, unspecified: Secondary | ICD-10-CM | POA: Diagnosis not present

## 2021-03-28 DIAGNOSIS — E1140 Type 2 diabetes mellitus with diabetic neuropathy, unspecified: Secondary | ICD-10-CM | POA: Diagnosis not present

## 2021-03-28 DIAGNOSIS — Z7901 Long term (current) use of anticoagulants: Secondary | ICD-10-CM | POA: Diagnosis not present

## 2021-03-28 DIAGNOSIS — R4182 Altered mental status, unspecified: Secondary | ICD-10-CM | POA: Diagnosis not present

## 2021-03-28 DIAGNOSIS — M62838 Other muscle spasm: Secondary | ICD-10-CM | POA: Diagnosis not present

## 2021-03-28 DIAGNOSIS — R0902 Hypoxemia: Secondary | ICD-10-CM | POA: Diagnosis not present

## 2021-03-29 DIAGNOSIS — I83223 Varicose veins of left lower extremity with both ulcer of ankle and inflammation: Secondary | ICD-10-CM | POA: Diagnosis not present

## 2021-03-29 DIAGNOSIS — I83213 Varicose veins of right lower extremity with both ulcer of ankle and inflammation: Secondary | ICD-10-CM | POA: Diagnosis not present

## 2021-03-29 DIAGNOSIS — I4821 Permanent atrial fibrillation: Secondary | ICD-10-CM | POA: Diagnosis not present

## 2021-03-29 DIAGNOSIS — S92902A Unspecified fracture of left foot, initial encounter for closed fracture: Secondary | ICD-10-CM | POA: Diagnosis not present

## 2021-03-29 DIAGNOSIS — G629 Polyneuropathy, unspecified: Secondary | ICD-10-CM | POA: Diagnosis not present

## 2021-03-29 DIAGNOSIS — Z7901 Long term (current) use of anticoagulants: Secondary | ICD-10-CM | POA: Diagnosis not present

## 2021-04-04 DIAGNOSIS — R748 Abnormal levels of other serum enzymes: Secondary | ICD-10-CM | POA: Diagnosis not present

## 2021-04-04 DIAGNOSIS — N390 Urinary tract infection, site not specified: Secondary | ICD-10-CM | POA: Diagnosis not present

## 2021-04-04 DIAGNOSIS — R7989 Other specified abnormal findings of blood chemistry: Secondary | ICD-10-CM | POA: Diagnosis not present

## 2021-04-04 DIAGNOSIS — D529 Folate deficiency anemia, unspecified: Secondary | ICD-10-CM | POA: Diagnosis not present

## 2021-04-04 DIAGNOSIS — D519 Vitamin B12 deficiency anemia, unspecified: Secondary | ICD-10-CM | POA: Diagnosis not present

## 2021-04-04 DIAGNOSIS — E559 Vitamin D deficiency, unspecified: Secondary | ICD-10-CM | POA: Diagnosis not present

## 2021-04-04 DIAGNOSIS — R791 Abnormal coagulation profile: Secondary | ICD-10-CM | POA: Diagnosis not present

## 2021-04-04 DIAGNOSIS — I10 Essential (primary) hypertension: Secondary | ICD-10-CM | POA: Diagnosis not present

## 2021-04-04 DIAGNOSIS — R051 Acute cough: Secondary | ICD-10-CM | POA: Diagnosis not present

## 2021-04-05 DIAGNOSIS — E559 Vitamin D deficiency, unspecified: Secondary | ICD-10-CM | POA: Diagnosis not present

## 2021-04-05 DIAGNOSIS — E785 Hyperlipidemia, unspecified: Secondary | ICD-10-CM | POA: Diagnosis not present

## 2021-04-05 DIAGNOSIS — I4891 Unspecified atrial fibrillation: Secondary | ICD-10-CM | POA: Diagnosis not present

## 2021-04-05 DIAGNOSIS — E119 Type 2 diabetes mellitus without complications: Secondary | ICD-10-CM | POA: Diagnosis not present

## 2021-04-06 DIAGNOSIS — E119 Type 2 diabetes mellitus without complications: Secondary | ICD-10-CM | POA: Diagnosis not present

## 2021-04-06 DIAGNOSIS — E7849 Other hyperlipidemia: Secondary | ICD-10-CM | POA: Diagnosis not present

## 2021-04-06 DIAGNOSIS — I10 Essential (primary) hypertension: Secondary | ICD-10-CM | POA: Diagnosis not present

## 2021-04-11 DIAGNOSIS — Z7901 Long term (current) use of anticoagulants: Secondary | ICD-10-CM | POA: Diagnosis not present

## 2021-04-12 DIAGNOSIS — L97919 Non-pressure chronic ulcer of unspecified part of right lower leg with unspecified severity: Secondary | ICD-10-CM | POA: Diagnosis not present

## 2021-04-12 DIAGNOSIS — Z7901 Long term (current) use of anticoagulants: Secondary | ICD-10-CM | POA: Diagnosis not present

## 2021-04-12 DIAGNOSIS — I4891 Unspecified atrial fibrillation: Secondary | ICD-10-CM | POA: Diagnosis not present

## 2021-04-12 DIAGNOSIS — L97929 Non-pressure chronic ulcer of unspecified part of left lower leg with unspecified severity: Secondary | ICD-10-CM | POA: Diagnosis not present

## 2021-04-18 DIAGNOSIS — Z7901 Long term (current) use of anticoagulants: Secondary | ICD-10-CM | POA: Diagnosis not present

## 2021-04-19 DIAGNOSIS — I4891 Unspecified atrial fibrillation: Secondary | ICD-10-CM | POA: Diagnosis not present

## 2021-04-19 DIAGNOSIS — Z7901 Long term (current) use of anticoagulants: Secondary | ICD-10-CM | POA: Diagnosis not present

## 2021-04-25 DIAGNOSIS — Z7901 Long term (current) use of anticoagulants: Secondary | ICD-10-CM | POA: Diagnosis not present

## 2021-04-26 DIAGNOSIS — Z7901 Long term (current) use of anticoagulants: Secondary | ICD-10-CM | POA: Diagnosis not present

## 2021-04-26 DIAGNOSIS — B351 Tinea unguium: Secondary | ICD-10-CM | POA: Diagnosis not present

## 2021-04-26 DIAGNOSIS — A0472 Enterocolitis due to Clostridium difficile, not specified as recurrent: Secondary | ICD-10-CM | POA: Diagnosis not present

## 2021-04-26 DIAGNOSIS — I4891 Unspecified atrial fibrillation: Secondary | ICD-10-CM | POA: Diagnosis not present

## 2021-04-26 DIAGNOSIS — M79676 Pain in unspecified toe(s): Secondary | ICD-10-CM | POA: Diagnosis not present

## 2021-04-26 DIAGNOSIS — L84 Corns and callosities: Secondary | ICD-10-CM | POA: Diagnosis not present

## 2021-04-26 DIAGNOSIS — E11622 Type 2 diabetes mellitus with other skin ulcer: Secondary | ICD-10-CM | POA: Diagnosis not present

## 2021-04-26 DIAGNOSIS — E1142 Type 2 diabetes mellitus with diabetic polyneuropathy: Secondary | ICD-10-CM | POA: Diagnosis not present

## 2021-05-02 DIAGNOSIS — Z7901 Long term (current) use of anticoagulants: Secondary | ICD-10-CM | POA: Diagnosis not present

## 2021-05-03 DIAGNOSIS — Z7901 Long term (current) use of anticoagulants: Secondary | ICD-10-CM | POA: Diagnosis not present

## 2021-05-03 DIAGNOSIS — I4891 Unspecified atrial fibrillation: Secondary | ICD-10-CM | POA: Diagnosis not present

## 2021-05-04 DIAGNOSIS — Z7901 Long term (current) use of anticoagulants: Secondary | ICD-10-CM | POA: Diagnosis not present

## 2021-05-07 DIAGNOSIS — I10 Essential (primary) hypertension: Secondary | ICD-10-CM | POA: Diagnosis not present

## 2021-05-07 DIAGNOSIS — E7849 Other hyperlipidemia: Secondary | ICD-10-CM | POA: Diagnosis not present

## 2021-05-07 DIAGNOSIS — E119 Type 2 diabetes mellitus without complications: Secondary | ICD-10-CM | POA: Diagnosis not present

## 2021-05-09 DIAGNOSIS — Z7901 Long term (current) use of anticoagulants: Secondary | ICD-10-CM | POA: Diagnosis not present

## 2021-05-16 DIAGNOSIS — Z7901 Long term (current) use of anticoagulants: Secondary | ICD-10-CM | POA: Diagnosis not present

## 2021-05-17 DIAGNOSIS — Z7901 Long term (current) use of anticoagulants: Secondary | ICD-10-CM | POA: Diagnosis not present

## 2021-05-17 DIAGNOSIS — I4891 Unspecified atrial fibrillation: Secondary | ICD-10-CM | POA: Diagnosis not present

## 2021-05-23 DIAGNOSIS — Z7901 Long term (current) use of anticoagulants: Secondary | ICD-10-CM | POA: Diagnosis not present

## 2021-05-24 DIAGNOSIS — I4891 Unspecified atrial fibrillation: Secondary | ICD-10-CM | POA: Diagnosis not present

## 2021-05-24 DIAGNOSIS — Z7901 Long term (current) use of anticoagulants: Secondary | ICD-10-CM | POA: Diagnosis not present

## 2021-05-29 DIAGNOSIS — I10 Essential (primary) hypertension: Secondary | ICD-10-CM | POA: Diagnosis not present

## 2021-05-29 DIAGNOSIS — E785 Hyperlipidemia, unspecified: Secondary | ICD-10-CM | POA: Diagnosis not present

## 2021-05-29 DIAGNOSIS — I4891 Unspecified atrial fibrillation: Secondary | ICD-10-CM | POA: Diagnosis not present

## 2021-05-29 DIAGNOSIS — I2510 Atherosclerotic heart disease of native coronary artery without angina pectoris: Secondary | ICD-10-CM | POA: Diagnosis not present

## 2021-05-29 DIAGNOSIS — Z9861 Coronary angioplasty status: Secondary | ICD-10-CM | POA: Diagnosis not present

## 2021-05-29 DIAGNOSIS — Z95818 Presence of other cardiac implants and grafts: Secondary | ICD-10-CM | POA: Diagnosis not present

## 2021-05-29 DIAGNOSIS — I4819 Other persistent atrial fibrillation: Secondary | ICD-10-CM | POA: Diagnosis not present

## 2021-05-29 DIAGNOSIS — Z7901 Long term (current) use of anticoagulants: Secondary | ICD-10-CM | POA: Diagnosis not present

## 2021-05-29 DIAGNOSIS — Z79899 Other long term (current) drug therapy: Secondary | ICD-10-CM | POA: Diagnosis not present

## 2021-05-29 DIAGNOSIS — I82409 Acute embolism and thrombosis of unspecified deep veins of unspecified lower extremity: Secondary | ICD-10-CM | POA: Diagnosis not present

## 2021-05-29 DIAGNOSIS — Z955 Presence of coronary angioplasty implant and graft: Secondary | ICD-10-CM | POA: Diagnosis not present

## 2021-05-29 DIAGNOSIS — I998 Other disorder of circulatory system: Secondary | ICD-10-CM | POA: Diagnosis not present

## 2021-05-29 DIAGNOSIS — Z7982 Long term (current) use of aspirin: Secondary | ICD-10-CM | POA: Diagnosis not present

## 2021-05-30 DIAGNOSIS — Z7901 Long term (current) use of anticoagulants: Secondary | ICD-10-CM | POA: Diagnosis not present

## 2021-06-01 DIAGNOSIS — I83013 Varicose veins of right lower extremity with ulcer of ankle: Secondary | ICD-10-CM | POA: Diagnosis not present

## 2021-06-01 DIAGNOSIS — I83023 Varicose veins of left lower extremity with ulcer of ankle: Secondary | ICD-10-CM | POA: Diagnosis not present

## 2021-06-05 DIAGNOSIS — L97821 Non-pressure chronic ulcer of other part of left lower leg limited to breakdown of skin: Secondary | ICD-10-CM | POA: Diagnosis not present

## 2021-06-05 DIAGNOSIS — L97811 Non-pressure chronic ulcer of other part of right lower leg limited to breakdown of skin: Secondary | ICD-10-CM | POA: Diagnosis not present

## 2021-06-05 DIAGNOSIS — I10 Essential (primary) hypertension: Secondary | ICD-10-CM | POA: Diagnosis not present

## 2021-06-05 DIAGNOSIS — I519 Heart disease, unspecified: Secondary | ICD-10-CM | POA: Diagnosis not present

## 2021-06-05 DIAGNOSIS — E1142 Type 2 diabetes mellitus with diabetic polyneuropathy: Secondary | ICD-10-CM | POA: Diagnosis not present

## 2021-06-05 DIAGNOSIS — I872 Venous insufficiency (chronic) (peripheral): Secondary | ICD-10-CM | POA: Diagnosis not present

## 2021-06-05 DIAGNOSIS — E785 Hyperlipidemia, unspecified: Secondary | ICD-10-CM | POA: Diagnosis not present

## 2021-06-07 DIAGNOSIS — E7849 Other hyperlipidemia: Secondary | ICD-10-CM | POA: Diagnosis not present

## 2021-06-07 DIAGNOSIS — E119 Type 2 diabetes mellitus without complications: Secondary | ICD-10-CM | POA: Diagnosis not present

## 2021-06-07 DIAGNOSIS — I4891 Unspecified atrial fibrillation: Secondary | ICD-10-CM | POA: Diagnosis not present

## 2021-06-07 DIAGNOSIS — Z7901 Long term (current) use of anticoagulants: Secondary | ICD-10-CM | POA: Diagnosis not present

## 2021-06-07 DIAGNOSIS — I10 Essential (primary) hypertension: Secondary | ICD-10-CM | POA: Diagnosis not present

## 2021-06-12 DIAGNOSIS — I872 Venous insufficiency (chronic) (peripheral): Secondary | ICD-10-CM | POA: Diagnosis not present

## 2021-06-12 DIAGNOSIS — L97821 Non-pressure chronic ulcer of other part of left lower leg limited to breakdown of skin: Secondary | ICD-10-CM | POA: Diagnosis not present

## 2021-06-12 DIAGNOSIS — E1142 Type 2 diabetes mellitus with diabetic polyneuropathy: Secondary | ICD-10-CM | POA: Diagnosis not present

## 2021-06-12 DIAGNOSIS — I519 Heart disease, unspecified: Secondary | ICD-10-CM | POA: Diagnosis not present

## 2021-06-12 DIAGNOSIS — L97811 Non-pressure chronic ulcer of other part of right lower leg limited to breakdown of skin: Secondary | ICD-10-CM | POA: Diagnosis not present

## 2021-06-12 DIAGNOSIS — I10 Essential (primary) hypertension: Secondary | ICD-10-CM | POA: Diagnosis not present

## 2021-06-12 DIAGNOSIS — E785 Hyperlipidemia, unspecified: Secondary | ICD-10-CM | POA: Diagnosis not present

## 2021-06-15 DIAGNOSIS — Z7901 Long term (current) use of anticoagulants: Secondary | ICD-10-CM | POA: Diagnosis not present

## 2021-06-19 DIAGNOSIS — I519 Heart disease, unspecified: Secondary | ICD-10-CM | POA: Diagnosis not present

## 2021-06-19 DIAGNOSIS — I872 Venous insufficiency (chronic) (peripheral): Secondary | ICD-10-CM | POA: Diagnosis not present

## 2021-06-19 DIAGNOSIS — L97811 Non-pressure chronic ulcer of other part of right lower leg limited to breakdown of skin: Secondary | ICD-10-CM | POA: Diagnosis not present

## 2021-06-19 DIAGNOSIS — E785 Hyperlipidemia, unspecified: Secondary | ICD-10-CM | POA: Diagnosis not present

## 2021-06-19 DIAGNOSIS — L97821 Non-pressure chronic ulcer of other part of left lower leg limited to breakdown of skin: Secondary | ICD-10-CM | POA: Diagnosis not present

## 2021-06-19 DIAGNOSIS — I10 Essential (primary) hypertension: Secondary | ICD-10-CM | POA: Diagnosis not present

## 2021-06-19 DIAGNOSIS — E1142 Type 2 diabetes mellitus with diabetic polyneuropathy: Secondary | ICD-10-CM | POA: Diagnosis not present

## 2021-06-21 DIAGNOSIS — I4891 Unspecified atrial fibrillation: Secondary | ICD-10-CM | POA: Diagnosis not present

## 2021-06-21 DIAGNOSIS — Z7901 Long term (current) use of anticoagulants: Secondary | ICD-10-CM | POA: Diagnosis not present

## 2021-06-22 DIAGNOSIS — E7800 Pure hypercholesterolemia, unspecified: Secondary | ICD-10-CM | POA: Diagnosis not present

## 2021-06-22 DIAGNOSIS — Z7901 Long term (current) use of anticoagulants: Secondary | ICD-10-CM | POA: Diagnosis not present

## 2021-06-22 DIAGNOSIS — I10 Essential (primary) hypertension: Secondary | ICD-10-CM | POA: Diagnosis not present

## 2021-06-26 DIAGNOSIS — I10 Essential (primary) hypertension: Secondary | ICD-10-CM | POA: Diagnosis not present

## 2021-06-26 DIAGNOSIS — L97821 Non-pressure chronic ulcer of other part of left lower leg limited to breakdown of skin: Secondary | ICD-10-CM | POA: Diagnosis not present

## 2021-06-26 DIAGNOSIS — E785 Hyperlipidemia, unspecified: Secondary | ICD-10-CM | POA: Diagnosis not present

## 2021-06-26 DIAGNOSIS — I872 Venous insufficiency (chronic) (peripheral): Secondary | ICD-10-CM | POA: Diagnosis not present

## 2021-06-26 DIAGNOSIS — I519 Heart disease, unspecified: Secondary | ICD-10-CM | POA: Diagnosis not present

## 2021-06-26 DIAGNOSIS — L97811 Non-pressure chronic ulcer of other part of right lower leg limited to breakdown of skin: Secondary | ICD-10-CM | POA: Diagnosis not present

## 2021-06-26 DIAGNOSIS — E1142 Type 2 diabetes mellitus with diabetic polyneuropathy: Secondary | ICD-10-CM | POA: Diagnosis not present

## 2021-06-27 DIAGNOSIS — R791 Abnormal coagulation profile: Secondary | ICD-10-CM | POA: Diagnosis not present

## 2021-06-28 DIAGNOSIS — Z7901 Long term (current) use of anticoagulants: Secondary | ICD-10-CM | POA: Diagnosis not present

## 2021-06-28 DIAGNOSIS — I4891 Unspecified atrial fibrillation: Secondary | ICD-10-CM | POA: Diagnosis not present

## 2021-07-03 DIAGNOSIS — I872 Venous insufficiency (chronic) (peripheral): Secondary | ICD-10-CM | POA: Diagnosis not present

## 2021-07-03 DIAGNOSIS — L97821 Non-pressure chronic ulcer of other part of left lower leg limited to breakdown of skin: Secondary | ICD-10-CM | POA: Diagnosis not present

## 2021-07-03 DIAGNOSIS — L97811 Non-pressure chronic ulcer of other part of right lower leg limited to breakdown of skin: Secondary | ICD-10-CM | POA: Diagnosis not present

## 2021-07-03 DIAGNOSIS — E785 Hyperlipidemia, unspecified: Secondary | ICD-10-CM | POA: Diagnosis not present

## 2021-07-03 DIAGNOSIS — E1142 Type 2 diabetes mellitus with diabetic polyneuropathy: Secondary | ICD-10-CM | POA: Diagnosis not present

## 2021-07-03 DIAGNOSIS — I519 Heart disease, unspecified: Secondary | ICD-10-CM | POA: Diagnosis not present

## 2021-07-03 DIAGNOSIS — I10 Essential (primary) hypertension: Secondary | ICD-10-CM | POA: Diagnosis not present

## 2021-07-11 DIAGNOSIS — I83013 Varicose veins of right lower extremity with ulcer of ankle: Secondary | ICD-10-CM | POA: Diagnosis not present

## 2021-07-11 DIAGNOSIS — Z7901 Long term (current) use of anticoagulants: Secondary | ICD-10-CM | POA: Diagnosis not present

## 2021-07-11 DIAGNOSIS — I83023 Varicose veins of left lower extremity with ulcer of ankle: Secondary | ICD-10-CM | POA: Diagnosis not present

## 2021-07-17 DIAGNOSIS — L97811 Non-pressure chronic ulcer of other part of right lower leg limited to breakdown of skin: Secondary | ICD-10-CM | POA: Diagnosis not present

## 2021-07-17 DIAGNOSIS — E785 Hyperlipidemia, unspecified: Secondary | ICD-10-CM | POA: Diagnosis not present

## 2021-07-17 DIAGNOSIS — L97821 Non-pressure chronic ulcer of other part of left lower leg limited to breakdown of skin: Secondary | ICD-10-CM | POA: Diagnosis not present

## 2021-07-17 DIAGNOSIS — I872 Venous insufficiency (chronic) (peripheral): Secondary | ICD-10-CM | POA: Diagnosis not present

## 2021-07-17 DIAGNOSIS — E1142 Type 2 diabetes mellitus with diabetic polyneuropathy: Secondary | ICD-10-CM | POA: Diagnosis not present

## 2021-07-17 DIAGNOSIS — I10 Essential (primary) hypertension: Secondary | ICD-10-CM | POA: Diagnosis not present

## 2021-07-17 DIAGNOSIS — I519 Heart disease, unspecified: Secondary | ICD-10-CM | POA: Diagnosis not present

## 2021-07-19 DIAGNOSIS — A0472 Enterocolitis due to Clostridium difficile, not specified as recurrent: Secondary | ICD-10-CM | POA: Diagnosis not present

## 2021-07-19 DIAGNOSIS — R197 Diarrhea, unspecified: Secondary | ICD-10-CM | POA: Diagnosis not present

## 2021-07-23 DIAGNOSIS — E7800 Pure hypercholesterolemia, unspecified: Secondary | ICD-10-CM | POA: Diagnosis not present

## 2021-07-23 DIAGNOSIS — I10 Essential (primary) hypertension: Secondary | ICD-10-CM | POA: Diagnosis not present

## 2021-07-24 DIAGNOSIS — I519 Heart disease, unspecified: Secondary | ICD-10-CM | POA: Diagnosis not present

## 2021-07-24 DIAGNOSIS — L97811 Non-pressure chronic ulcer of other part of right lower leg limited to breakdown of skin: Secondary | ICD-10-CM | POA: Diagnosis not present

## 2021-07-24 DIAGNOSIS — E785 Hyperlipidemia, unspecified: Secondary | ICD-10-CM | POA: Diagnosis not present

## 2021-07-24 DIAGNOSIS — L97821 Non-pressure chronic ulcer of other part of left lower leg limited to breakdown of skin: Secondary | ICD-10-CM | POA: Diagnosis not present

## 2021-07-24 DIAGNOSIS — Z7901 Long term (current) use of anticoagulants: Secondary | ICD-10-CM | POA: Diagnosis not present

## 2021-07-24 DIAGNOSIS — I872 Venous insufficiency (chronic) (peripheral): Secondary | ICD-10-CM | POA: Diagnosis not present

## 2021-07-24 DIAGNOSIS — E1142 Type 2 diabetes mellitus with diabetic polyneuropathy: Secondary | ICD-10-CM | POA: Diagnosis not present

## 2021-07-24 DIAGNOSIS — I10 Essential (primary) hypertension: Secondary | ICD-10-CM | POA: Diagnosis not present

## 2021-07-25 DIAGNOSIS — E119 Type 2 diabetes mellitus without complications: Secondary | ICD-10-CM | POA: Diagnosis not present

## 2021-07-25 DIAGNOSIS — Z7901 Long term (current) use of anticoagulants: Secondary | ICD-10-CM | POA: Diagnosis not present

## 2021-07-25 DIAGNOSIS — I4891 Unspecified atrial fibrillation: Secondary | ICD-10-CM | POA: Diagnosis not present

## 2021-07-25 DIAGNOSIS — Z794 Long term (current) use of insulin: Secondary | ICD-10-CM | POA: Diagnosis not present

## 2021-07-25 DIAGNOSIS — Z79899 Other long term (current) drug therapy: Secondary | ICD-10-CM | POA: Diagnosis not present

## 2021-07-30 DIAGNOSIS — R309 Painful micturition, unspecified: Secondary | ICD-10-CM | POA: Diagnosis not present

## 2021-07-31 DIAGNOSIS — I519 Heart disease, unspecified: Secondary | ICD-10-CM | POA: Diagnosis not present

## 2021-07-31 DIAGNOSIS — E1142 Type 2 diabetes mellitus with diabetic polyneuropathy: Secondary | ICD-10-CM | POA: Diagnosis not present

## 2021-07-31 DIAGNOSIS — I872 Venous insufficiency (chronic) (peripheral): Secondary | ICD-10-CM | POA: Diagnosis not present

## 2021-07-31 DIAGNOSIS — I10 Essential (primary) hypertension: Secondary | ICD-10-CM | POA: Diagnosis not present

## 2021-07-31 DIAGNOSIS — E785 Hyperlipidemia, unspecified: Secondary | ICD-10-CM | POA: Diagnosis not present

## 2021-07-31 DIAGNOSIS — L97811 Non-pressure chronic ulcer of other part of right lower leg limited to breakdown of skin: Secondary | ICD-10-CM | POA: Diagnosis not present

## 2021-07-31 DIAGNOSIS — L97821 Non-pressure chronic ulcer of other part of left lower leg limited to breakdown of skin: Secondary | ICD-10-CM | POA: Diagnosis not present

## 2021-08-01 DIAGNOSIS — Z7901 Long term (current) use of anticoagulants: Secondary | ICD-10-CM | POA: Diagnosis not present

## 2021-08-02 DIAGNOSIS — R300 Dysuria: Secondary | ICD-10-CM | POA: Diagnosis not present

## 2021-08-02 DIAGNOSIS — I4891 Unspecified atrial fibrillation: Secondary | ICD-10-CM | POA: Diagnosis not present

## 2021-08-02 DIAGNOSIS — Z79899 Other long term (current) drug therapy: Secondary | ICD-10-CM | POA: Diagnosis not present

## 2021-08-02 DIAGNOSIS — N750 Cyst of Bartholin's gland: Secondary | ICD-10-CM | POA: Diagnosis not present

## 2021-08-07 DIAGNOSIS — E1142 Type 2 diabetes mellitus with diabetic polyneuropathy: Secondary | ICD-10-CM | POA: Diagnosis not present

## 2021-08-07 DIAGNOSIS — I519 Heart disease, unspecified: Secondary | ICD-10-CM | POA: Diagnosis not present

## 2021-08-07 DIAGNOSIS — I4891 Unspecified atrial fibrillation: Secondary | ICD-10-CM | POA: Diagnosis not present

## 2021-08-07 DIAGNOSIS — I10 Essential (primary) hypertension: Secondary | ICD-10-CM | POA: Diagnosis not present

## 2021-08-07 DIAGNOSIS — E785 Hyperlipidemia, unspecified: Secondary | ICD-10-CM | POA: Diagnosis not present

## 2021-08-07 DIAGNOSIS — F32A Depression, unspecified: Secondary | ICD-10-CM | POA: Diagnosis not present

## 2021-08-07 DIAGNOSIS — N3281 Overactive bladder: Secondary | ICD-10-CM | POA: Diagnosis not present

## 2021-08-07 DIAGNOSIS — I872 Venous insufficiency (chronic) (peripheral): Secondary | ICD-10-CM | POA: Diagnosis not present

## 2021-08-07 DIAGNOSIS — K219 Gastro-esophageal reflux disease without esophagitis: Secondary | ICD-10-CM | POA: Diagnosis not present

## 2021-08-08 DIAGNOSIS — I83013 Varicose veins of right lower extremity with ulcer of ankle: Secondary | ICD-10-CM | POA: Diagnosis not present

## 2021-08-08 DIAGNOSIS — I83023 Varicose veins of left lower extremity with ulcer of ankle: Secondary | ICD-10-CM | POA: Diagnosis not present

## 2021-08-14 DIAGNOSIS — I4891 Unspecified atrial fibrillation: Secondary | ICD-10-CM | POA: Diagnosis not present

## 2021-08-14 DIAGNOSIS — I872 Venous insufficiency (chronic) (peripheral): Secondary | ICD-10-CM | POA: Diagnosis not present

## 2021-08-14 DIAGNOSIS — I519 Heart disease, unspecified: Secondary | ICD-10-CM | POA: Diagnosis not present

## 2021-08-14 DIAGNOSIS — F32A Depression, unspecified: Secondary | ICD-10-CM | POA: Diagnosis not present

## 2021-08-14 DIAGNOSIS — E785 Hyperlipidemia, unspecified: Secondary | ICD-10-CM | POA: Diagnosis not present

## 2021-08-14 DIAGNOSIS — E1142 Type 2 diabetes mellitus with diabetic polyneuropathy: Secondary | ICD-10-CM | POA: Diagnosis not present

## 2021-08-14 DIAGNOSIS — I10 Essential (primary) hypertension: Secondary | ICD-10-CM | POA: Diagnosis not present

## 2021-08-14 DIAGNOSIS — N3281 Overactive bladder: Secondary | ICD-10-CM | POA: Diagnosis not present

## 2021-08-14 DIAGNOSIS — K219 Gastro-esophageal reflux disease without esophagitis: Secondary | ICD-10-CM | POA: Diagnosis not present

## 2021-08-15 DIAGNOSIS — I482 Chronic atrial fibrillation, unspecified: Secondary | ICD-10-CM | POA: Diagnosis not present

## 2021-08-15 DIAGNOSIS — Z7901 Long term (current) use of anticoagulants: Secondary | ICD-10-CM | POA: Diagnosis not present

## 2021-08-22 DIAGNOSIS — I4891 Unspecified atrial fibrillation: Secondary | ICD-10-CM | POA: Diagnosis not present

## 2021-08-22 DIAGNOSIS — Z7901 Long term (current) use of anticoagulants: Secondary | ICD-10-CM | POA: Diagnosis not present

## 2021-08-27 DIAGNOSIS — R609 Edema, unspecified: Secondary | ICD-10-CM | POA: Diagnosis not present

## 2021-08-27 DIAGNOSIS — I10 Essential (primary) hypertension: Secondary | ICD-10-CM | POA: Diagnosis not present

## 2021-08-27 DIAGNOSIS — Z20822 Contact with and (suspected) exposure to covid-19: Secondary | ICD-10-CM | POA: Diagnosis not present

## 2021-08-27 DIAGNOSIS — R0902 Hypoxemia: Secondary | ICD-10-CM | POA: Diagnosis not present

## 2021-08-27 DIAGNOSIS — L0390 Cellulitis, unspecified: Secondary | ICD-10-CM | POA: Diagnosis not present

## 2021-08-27 DIAGNOSIS — N390 Urinary tract infection, site not specified: Secondary | ICD-10-CM | POA: Diagnosis not present

## 2021-08-27 DIAGNOSIS — R001 Bradycardia, unspecified: Secondary | ICD-10-CM | POA: Diagnosis not present

## 2021-08-28 DIAGNOSIS — N390 Urinary tract infection, site not specified: Secondary | ICD-10-CM | POA: Diagnosis not present

## 2021-08-29 DIAGNOSIS — E1142 Type 2 diabetes mellitus with diabetic polyneuropathy: Secondary | ICD-10-CM | POA: Diagnosis not present

## 2021-08-29 DIAGNOSIS — I10 Essential (primary) hypertension: Secondary | ICD-10-CM | POA: Diagnosis not present

## 2021-08-29 DIAGNOSIS — I872 Venous insufficiency (chronic) (peripheral): Secondary | ICD-10-CM | POA: Diagnosis not present

## 2021-08-29 DIAGNOSIS — K219 Gastro-esophageal reflux disease without esophagitis: Secondary | ICD-10-CM | POA: Diagnosis not present

## 2021-08-29 DIAGNOSIS — I4891 Unspecified atrial fibrillation: Secondary | ICD-10-CM | POA: Diagnosis not present

## 2021-08-29 DIAGNOSIS — N3281 Overactive bladder: Secondary | ICD-10-CM | POA: Diagnosis not present

## 2021-08-29 DIAGNOSIS — I519 Heart disease, unspecified: Secondary | ICD-10-CM | POA: Diagnosis not present

## 2021-08-29 DIAGNOSIS — E785 Hyperlipidemia, unspecified: Secondary | ICD-10-CM | POA: Diagnosis not present

## 2021-08-29 DIAGNOSIS — L97811 Non-pressure chronic ulcer of other part of right lower leg limited to breakdown of skin: Secondary | ICD-10-CM | POA: Diagnosis not present

## 2021-08-30 DIAGNOSIS — R413 Other amnesia: Secondary | ICD-10-CM | POA: Diagnosis not present

## 2021-08-30 DIAGNOSIS — N390 Urinary tract infection, site not specified: Secondary | ICD-10-CM | POA: Diagnosis not present

## 2021-09-04 DIAGNOSIS — N3281 Overactive bladder: Secondary | ICD-10-CM | POA: Diagnosis not present

## 2021-09-04 DIAGNOSIS — I4891 Unspecified atrial fibrillation: Secondary | ICD-10-CM | POA: Diagnosis not present

## 2021-09-04 DIAGNOSIS — E1142 Type 2 diabetes mellitus with diabetic polyneuropathy: Secondary | ICD-10-CM | POA: Diagnosis not present

## 2021-09-04 DIAGNOSIS — I519 Heart disease, unspecified: Secondary | ICD-10-CM | POA: Diagnosis not present

## 2021-09-04 DIAGNOSIS — I872 Venous insufficiency (chronic) (peripheral): Secondary | ICD-10-CM | POA: Diagnosis not present

## 2021-09-04 DIAGNOSIS — K219 Gastro-esophageal reflux disease without esophagitis: Secondary | ICD-10-CM | POA: Diagnosis not present

## 2021-09-04 DIAGNOSIS — L97811 Non-pressure chronic ulcer of other part of right lower leg limited to breakdown of skin: Secondary | ICD-10-CM | POA: Diagnosis not present

## 2021-09-04 DIAGNOSIS — E785 Hyperlipidemia, unspecified: Secondary | ICD-10-CM | POA: Diagnosis not present

## 2021-09-04 DIAGNOSIS — I10 Essential (primary) hypertension: Secondary | ICD-10-CM | POA: Diagnosis not present

## 2021-09-12 DIAGNOSIS — I872 Venous insufficiency (chronic) (peripheral): Secondary | ICD-10-CM | POA: Diagnosis not present

## 2021-09-12 DIAGNOSIS — K219 Gastro-esophageal reflux disease without esophagitis: Secondary | ICD-10-CM | POA: Diagnosis not present

## 2021-09-12 DIAGNOSIS — I519 Heart disease, unspecified: Secondary | ICD-10-CM | POA: Diagnosis not present

## 2021-09-12 DIAGNOSIS — L97811 Non-pressure chronic ulcer of other part of right lower leg limited to breakdown of skin: Secondary | ICD-10-CM | POA: Diagnosis not present

## 2021-09-12 DIAGNOSIS — I10 Essential (primary) hypertension: Secondary | ICD-10-CM | POA: Diagnosis not present

## 2021-09-12 DIAGNOSIS — N3281 Overactive bladder: Secondary | ICD-10-CM | POA: Diagnosis not present

## 2021-09-12 DIAGNOSIS — E1142 Type 2 diabetes mellitus with diabetic polyneuropathy: Secondary | ICD-10-CM | POA: Diagnosis not present

## 2021-09-12 DIAGNOSIS — E785 Hyperlipidemia, unspecified: Secondary | ICD-10-CM | POA: Diagnosis not present

## 2021-09-12 DIAGNOSIS — I4891 Unspecified atrial fibrillation: Secondary | ICD-10-CM | POA: Diagnosis not present

## 2021-09-17 DIAGNOSIS — N764 Abscess of vulva: Secondary | ICD-10-CM | POA: Diagnosis not present

## 2021-09-17 DIAGNOSIS — I252 Old myocardial infarction: Secondary | ICD-10-CM | POA: Diagnosis not present

## 2021-09-17 DIAGNOSIS — I959 Hypotension, unspecified: Secondary | ICD-10-CM | POA: Diagnosis not present

## 2021-09-17 DIAGNOSIS — M25512 Pain in left shoulder: Secondary | ICD-10-CM | POA: Diagnosis not present

## 2021-09-17 DIAGNOSIS — U071 COVID-19: Secondary | ICD-10-CM | POA: Diagnosis not present

## 2021-09-17 DIAGNOSIS — E119 Type 2 diabetes mellitus without complications: Secondary | ICD-10-CM | POA: Diagnosis not present

## 2021-09-17 DIAGNOSIS — R509 Fever, unspecified: Secondary | ICD-10-CM | POA: Diagnosis not present

## 2021-09-17 DIAGNOSIS — Z794 Long term (current) use of insulin: Secondary | ICD-10-CM | POA: Diagnosis not present

## 2021-09-17 DIAGNOSIS — I509 Heart failure, unspecified: Secondary | ICD-10-CM | POA: Diagnosis not present

## 2021-09-17 DIAGNOSIS — F0390 Unspecified dementia without behavioral disturbance: Secondary | ICD-10-CM | POA: Diagnosis not present

## 2021-09-17 DIAGNOSIS — I4891 Unspecified atrial fibrillation: Secondary | ICD-10-CM | POA: Diagnosis not present

## 2021-09-17 DIAGNOSIS — I110 Hypertensive heart disease with heart failure: Secondary | ICD-10-CM | POA: Diagnosis not present

## 2021-09-18 DIAGNOSIS — I509 Heart failure, unspecified: Secondary | ICD-10-CM | POA: Diagnosis not present

## 2021-09-18 DIAGNOSIS — I110 Hypertensive heart disease with heart failure: Secondary | ICD-10-CM | POA: Diagnosis not present

## 2021-09-18 DIAGNOSIS — Z794 Long term (current) use of insulin: Secondary | ICD-10-CM | POA: Diagnosis not present

## 2021-09-18 DIAGNOSIS — I4891 Unspecified atrial fibrillation: Secondary | ICD-10-CM | POA: Diagnosis not present

## 2021-09-18 DIAGNOSIS — U071 COVID-19: Secondary | ICD-10-CM | POA: Diagnosis not present

## 2021-09-18 DIAGNOSIS — Z7401 Bed confinement status: Secondary | ICD-10-CM | POA: Diagnosis not present

## 2021-09-18 DIAGNOSIS — E119 Type 2 diabetes mellitus without complications: Secondary | ICD-10-CM | POA: Diagnosis not present

## 2021-09-18 DIAGNOSIS — F0390 Unspecified dementia without behavioral disturbance: Secondary | ICD-10-CM | POA: Diagnosis not present

## 2021-09-18 DIAGNOSIS — M25512 Pain in left shoulder: Secondary | ICD-10-CM | POA: Diagnosis not present

## 2021-09-18 DIAGNOSIS — I252 Old myocardial infarction: Secondary | ICD-10-CM | POA: Diagnosis not present

## 2021-09-18 DIAGNOSIS — I10 Essential (primary) hypertension: Secondary | ICD-10-CM | POA: Diagnosis not present

## 2021-09-19 DIAGNOSIS — L97811 Non-pressure chronic ulcer of other part of right lower leg limited to breakdown of skin: Secondary | ICD-10-CM | POA: Diagnosis not present

## 2021-09-19 DIAGNOSIS — I872 Venous insufficiency (chronic) (peripheral): Secondary | ICD-10-CM | POA: Diagnosis not present

## 2021-09-19 DIAGNOSIS — I4891 Unspecified atrial fibrillation: Secondary | ICD-10-CM | POA: Diagnosis not present

## 2021-09-19 DIAGNOSIS — N3281 Overactive bladder: Secondary | ICD-10-CM | POA: Diagnosis not present

## 2021-09-19 DIAGNOSIS — I519 Heart disease, unspecified: Secondary | ICD-10-CM | POA: Diagnosis not present

## 2021-09-19 DIAGNOSIS — E1142 Type 2 diabetes mellitus with diabetic polyneuropathy: Secondary | ICD-10-CM | POA: Diagnosis not present

## 2021-09-19 DIAGNOSIS — I10 Essential (primary) hypertension: Secondary | ICD-10-CM | POA: Diagnosis not present

## 2021-09-19 DIAGNOSIS — E785 Hyperlipidemia, unspecified: Secondary | ICD-10-CM | POA: Diagnosis not present

## 2021-09-19 DIAGNOSIS — K219 Gastro-esophageal reflux disease without esophagitis: Secondary | ICD-10-CM | POA: Diagnosis not present

## 2021-09-20 DIAGNOSIS — U071 COVID-19: Secondary | ICD-10-CM | POA: Diagnosis not present

## 2021-09-21 DIAGNOSIS — I10 Essential (primary) hypertension: Secondary | ICD-10-CM | POA: Diagnosis not present

## 2021-09-21 DIAGNOSIS — R079 Chest pain, unspecified: Secondary | ICD-10-CM | POA: Diagnosis not present

## 2021-09-25 DIAGNOSIS — I10 Essential (primary) hypertension: Secondary | ICD-10-CM | POA: Diagnosis not present

## 2021-09-25 DIAGNOSIS — L97811 Non-pressure chronic ulcer of other part of right lower leg limited to breakdown of skin: Secondary | ICD-10-CM | POA: Diagnosis not present

## 2021-09-25 DIAGNOSIS — E1142 Type 2 diabetes mellitus with diabetic polyneuropathy: Secondary | ICD-10-CM | POA: Diagnosis not present

## 2021-09-25 DIAGNOSIS — I519 Heart disease, unspecified: Secondary | ICD-10-CM | POA: Diagnosis not present

## 2021-09-25 DIAGNOSIS — E785 Hyperlipidemia, unspecified: Secondary | ICD-10-CM | POA: Diagnosis not present

## 2021-09-25 DIAGNOSIS — I4891 Unspecified atrial fibrillation: Secondary | ICD-10-CM | POA: Diagnosis not present

## 2021-09-25 DIAGNOSIS — N3281 Overactive bladder: Secondary | ICD-10-CM | POA: Diagnosis not present

## 2021-09-25 DIAGNOSIS — K219 Gastro-esophageal reflux disease without esophagitis: Secondary | ICD-10-CM | POA: Diagnosis not present

## 2021-09-25 DIAGNOSIS — I872 Venous insufficiency (chronic) (peripheral): Secondary | ICD-10-CM | POA: Diagnosis not present

## 2021-10-02 DIAGNOSIS — N3281 Overactive bladder: Secondary | ICD-10-CM | POA: Diagnosis not present

## 2021-10-02 DIAGNOSIS — I872 Venous insufficiency (chronic) (peripheral): Secondary | ICD-10-CM | POA: Diagnosis not present

## 2021-10-02 DIAGNOSIS — I4891 Unspecified atrial fibrillation: Secondary | ICD-10-CM | POA: Diagnosis not present

## 2021-10-02 DIAGNOSIS — E785 Hyperlipidemia, unspecified: Secondary | ICD-10-CM | POA: Diagnosis not present

## 2021-10-02 DIAGNOSIS — I10 Essential (primary) hypertension: Secondary | ICD-10-CM | POA: Diagnosis not present

## 2021-10-02 DIAGNOSIS — K219 Gastro-esophageal reflux disease without esophagitis: Secondary | ICD-10-CM | POA: Diagnosis not present

## 2021-10-02 DIAGNOSIS — L97811 Non-pressure chronic ulcer of other part of right lower leg limited to breakdown of skin: Secondary | ICD-10-CM | POA: Diagnosis not present

## 2021-10-02 DIAGNOSIS — E1142 Type 2 diabetes mellitus with diabetic polyneuropathy: Secondary | ICD-10-CM | POA: Diagnosis not present

## 2021-10-02 DIAGNOSIS — I519 Heart disease, unspecified: Secondary | ICD-10-CM | POA: Diagnosis not present

## 2021-10-03 DIAGNOSIS — E119 Type 2 diabetes mellitus without complications: Secondary | ICD-10-CM | POA: Diagnosis not present

## 2021-10-03 DIAGNOSIS — Z7901 Long term (current) use of anticoagulants: Secondary | ICD-10-CM | POA: Diagnosis not present

## 2021-10-03 DIAGNOSIS — D519 Vitamin B12 deficiency anemia, unspecified: Secondary | ICD-10-CM | POA: Diagnosis not present

## 2021-10-03 DIAGNOSIS — E559 Vitamin D deficiency, unspecified: Secondary | ICD-10-CM | POA: Diagnosis not present

## 2021-10-10 DIAGNOSIS — E1142 Type 2 diabetes mellitus with diabetic polyneuropathy: Secondary | ICD-10-CM | POA: Diagnosis not present

## 2021-10-10 DIAGNOSIS — L84 Corns and callosities: Secondary | ICD-10-CM | POA: Diagnosis not present

## 2021-10-10 DIAGNOSIS — M79676 Pain in unspecified toe(s): Secondary | ICD-10-CM | POA: Diagnosis not present

## 2021-10-10 DIAGNOSIS — B351 Tinea unguium: Secondary | ICD-10-CM | POA: Diagnosis not present

## 2021-10-11 DIAGNOSIS — E119 Type 2 diabetes mellitus without complications: Secondary | ICD-10-CM | POA: Diagnosis not present

## 2021-10-11 DIAGNOSIS — Z794 Long term (current) use of insulin: Secondary | ICD-10-CM | POA: Diagnosis not present

## 2021-10-11 DIAGNOSIS — Z7901 Long term (current) use of anticoagulants: Secondary | ICD-10-CM | POA: Diagnosis not present

## 2021-10-11 DIAGNOSIS — I4891 Unspecified atrial fibrillation: Secondary | ICD-10-CM | POA: Diagnosis not present

## 2021-10-16 DIAGNOSIS — I519 Heart disease, unspecified: Secondary | ICD-10-CM | POA: Diagnosis not present

## 2021-10-16 DIAGNOSIS — I872 Venous insufficiency (chronic) (peripheral): Secondary | ICD-10-CM | POA: Diagnosis not present

## 2021-10-16 DIAGNOSIS — N3281 Overactive bladder: Secondary | ICD-10-CM | POA: Diagnosis not present

## 2021-10-16 DIAGNOSIS — L97811 Non-pressure chronic ulcer of other part of right lower leg limited to breakdown of skin: Secondary | ICD-10-CM | POA: Diagnosis not present

## 2021-10-16 DIAGNOSIS — K219 Gastro-esophageal reflux disease without esophagitis: Secondary | ICD-10-CM | POA: Diagnosis not present

## 2021-10-16 DIAGNOSIS — I4891 Unspecified atrial fibrillation: Secondary | ICD-10-CM | POA: Diagnosis not present

## 2021-10-16 DIAGNOSIS — E1142 Type 2 diabetes mellitus with diabetic polyneuropathy: Secondary | ICD-10-CM | POA: Diagnosis not present

## 2021-10-16 DIAGNOSIS — E785 Hyperlipidemia, unspecified: Secondary | ICD-10-CM | POA: Diagnosis not present

## 2021-10-16 DIAGNOSIS — I10 Essential (primary) hypertension: Secondary | ICD-10-CM | POA: Diagnosis not present

## 2021-10-17 DIAGNOSIS — N390 Urinary tract infection, site not specified: Secondary | ICD-10-CM | POA: Diagnosis not present

## 2021-10-17 DIAGNOSIS — Z7901 Long term (current) use of anticoagulants: Secondary | ICD-10-CM | POA: Diagnosis not present

## 2021-10-18 DIAGNOSIS — N390 Urinary tract infection, site not specified: Secondary | ICD-10-CM | POA: Diagnosis not present

## 2021-10-18 DIAGNOSIS — Z7901 Long term (current) use of anticoagulants: Secondary | ICD-10-CM | POA: Diagnosis not present

## 2021-10-18 DIAGNOSIS — I4891 Unspecified atrial fibrillation: Secondary | ICD-10-CM | POA: Diagnosis not present

## 2021-10-23 DIAGNOSIS — N3281 Overactive bladder: Secondary | ICD-10-CM | POA: Diagnosis not present

## 2021-10-23 DIAGNOSIS — E1142 Type 2 diabetes mellitus with diabetic polyneuropathy: Secondary | ICD-10-CM | POA: Diagnosis not present

## 2021-10-23 DIAGNOSIS — I519 Heart disease, unspecified: Secondary | ICD-10-CM | POA: Diagnosis not present

## 2021-10-23 DIAGNOSIS — I4891 Unspecified atrial fibrillation: Secondary | ICD-10-CM | POA: Diagnosis not present

## 2021-10-23 DIAGNOSIS — E785 Hyperlipidemia, unspecified: Secondary | ICD-10-CM | POA: Diagnosis not present

## 2021-10-23 DIAGNOSIS — K219 Gastro-esophageal reflux disease without esophagitis: Secondary | ICD-10-CM | POA: Diagnosis not present

## 2021-10-23 DIAGNOSIS — I10 Essential (primary) hypertension: Secondary | ICD-10-CM | POA: Diagnosis not present

## 2021-10-23 DIAGNOSIS — I872 Venous insufficiency (chronic) (peripheral): Secondary | ICD-10-CM | POA: Diagnosis not present

## 2021-10-23 DIAGNOSIS — L97811 Non-pressure chronic ulcer of other part of right lower leg limited to breakdown of skin: Secondary | ICD-10-CM | POA: Diagnosis not present

## 2021-10-24 DIAGNOSIS — Z7901 Long term (current) use of anticoagulants: Secondary | ICD-10-CM | POA: Diagnosis not present

## 2021-10-25 DIAGNOSIS — L97921 Non-pressure chronic ulcer of unspecified part of left lower leg limited to breakdown of skin: Secondary | ICD-10-CM | POA: Diagnosis not present

## 2021-10-25 DIAGNOSIS — Z79899 Other long term (current) drug therapy: Secondary | ICD-10-CM | POA: Diagnosis not present

## 2021-10-25 DIAGNOSIS — I4891 Unspecified atrial fibrillation: Secondary | ICD-10-CM | POA: Diagnosis not present

## 2021-10-31 DIAGNOSIS — Z7901 Long term (current) use of anticoagulants: Secondary | ICD-10-CM | POA: Diagnosis not present

## 2021-11-01 DIAGNOSIS — I4891 Unspecified atrial fibrillation: Secondary | ICD-10-CM | POA: Diagnosis not present

## 2021-11-01 DIAGNOSIS — L03116 Cellulitis of left lower limb: Secondary | ICD-10-CM | POA: Diagnosis not present

## 2021-11-01 DIAGNOSIS — Z7901 Long term (current) use of anticoagulants: Secondary | ICD-10-CM | POA: Diagnosis not present

## 2021-11-01 DIAGNOSIS — L03115 Cellulitis of right lower limb: Secondary | ICD-10-CM | POA: Diagnosis not present

## 2021-11-07 DIAGNOSIS — I83223 Varicose veins of left lower extremity with both ulcer of ankle and inflammation: Secondary | ICD-10-CM | POA: Diagnosis not present

## 2021-11-07 DIAGNOSIS — I83213 Varicose veins of right lower extremity with both ulcer of ankle and inflammation: Secondary | ICD-10-CM | POA: Diagnosis not present

## 2021-11-07 DIAGNOSIS — Z7901 Long term (current) use of anticoagulants: Secondary | ICD-10-CM | POA: Diagnosis not present

## 2021-11-08 DIAGNOSIS — I4891 Unspecified atrial fibrillation: Secondary | ICD-10-CM | POA: Diagnosis not present

## 2021-11-08 DIAGNOSIS — Z7901 Long term (current) use of anticoagulants: Secondary | ICD-10-CM | POA: Diagnosis not present

## 2021-11-13 DIAGNOSIS — E785 Hyperlipidemia, unspecified: Secondary | ICD-10-CM | POA: Diagnosis not present

## 2021-11-13 DIAGNOSIS — I4891 Unspecified atrial fibrillation: Secondary | ICD-10-CM | POA: Diagnosis not present

## 2021-11-13 DIAGNOSIS — F32A Depression, unspecified: Secondary | ICD-10-CM | POA: Diagnosis not present

## 2021-11-13 DIAGNOSIS — N3281 Overactive bladder: Secondary | ICD-10-CM | POA: Diagnosis not present

## 2021-11-13 DIAGNOSIS — I872 Venous insufficiency (chronic) (peripheral): Secondary | ICD-10-CM | POA: Diagnosis not present

## 2021-11-13 DIAGNOSIS — K219 Gastro-esophageal reflux disease without esophagitis: Secondary | ICD-10-CM | POA: Diagnosis not present

## 2021-11-13 DIAGNOSIS — I10 Essential (primary) hypertension: Secondary | ICD-10-CM | POA: Diagnosis not present

## 2021-11-13 DIAGNOSIS — E1142 Type 2 diabetes mellitus with diabetic polyneuropathy: Secondary | ICD-10-CM | POA: Diagnosis not present

## 2021-11-13 DIAGNOSIS — Z794 Long term (current) use of insulin: Secondary | ICD-10-CM | POA: Diagnosis not present

## 2021-11-14 DIAGNOSIS — Z7901 Long term (current) use of anticoagulants: Secondary | ICD-10-CM | POA: Diagnosis not present

## 2021-11-15 DIAGNOSIS — Z7901 Long term (current) use of anticoagulants: Secondary | ICD-10-CM | POA: Diagnosis not present

## 2021-11-15 DIAGNOSIS — I4891 Unspecified atrial fibrillation: Secondary | ICD-10-CM | POA: Diagnosis not present

## 2021-11-19 DIAGNOSIS — E119 Type 2 diabetes mellitus without complications: Secondary | ICD-10-CM | POA: Diagnosis not present

## 2021-11-19 DIAGNOSIS — Z7901 Long term (current) use of anticoagulants: Secondary | ICD-10-CM | POA: Diagnosis not present

## 2021-11-20 DIAGNOSIS — K219 Gastro-esophageal reflux disease without esophagitis: Secondary | ICD-10-CM | POA: Diagnosis not present

## 2021-11-20 DIAGNOSIS — I872 Venous insufficiency (chronic) (peripheral): Secondary | ICD-10-CM | POA: Diagnosis not present

## 2021-11-20 DIAGNOSIS — F32A Depression, unspecified: Secondary | ICD-10-CM | POA: Diagnosis not present

## 2021-11-20 DIAGNOSIS — E785 Hyperlipidemia, unspecified: Secondary | ICD-10-CM | POA: Diagnosis not present

## 2021-11-20 DIAGNOSIS — Z794 Long term (current) use of insulin: Secondary | ICD-10-CM | POA: Diagnosis not present

## 2021-11-20 DIAGNOSIS — E1142 Type 2 diabetes mellitus with diabetic polyneuropathy: Secondary | ICD-10-CM | POA: Diagnosis not present

## 2021-11-20 DIAGNOSIS — N3281 Overactive bladder: Secondary | ICD-10-CM | POA: Diagnosis not present

## 2021-11-20 DIAGNOSIS — I4891 Unspecified atrial fibrillation: Secondary | ICD-10-CM | POA: Diagnosis not present

## 2021-11-20 DIAGNOSIS — I10 Essential (primary) hypertension: Secondary | ICD-10-CM | POA: Diagnosis not present

## 2021-11-22 DIAGNOSIS — Z7901 Long term (current) use of anticoagulants: Secondary | ICD-10-CM | POA: Diagnosis not present

## 2021-11-22 DIAGNOSIS — I4891 Unspecified atrial fibrillation: Secondary | ICD-10-CM | POA: Diagnosis not present

## 2021-11-27 DIAGNOSIS — I4891 Unspecified atrial fibrillation: Secondary | ICD-10-CM | POA: Diagnosis not present

## 2021-11-27 DIAGNOSIS — N3281 Overactive bladder: Secondary | ICD-10-CM | POA: Diagnosis not present

## 2021-11-27 DIAGNOSIS — F32A Depression, unspecified: Secondary | ICD-10-CM | POA: Diagnosis not present

## 2021-11-27 DIAGNOSIS — I872 Venous insufficiency (chronic) (peripheral): Secondary | ICD-10-CM | POA: Diagnosis not present

## 2021-11-27 DIAGNOSIS — E785 Hyperlipidemia, unspecified: Secondary | ICD-10-CM | POA: Diagnosis not present

## 2021-11-27 DIAGNOSIS — Z794 Long term (current) use of insulin: Secondary | ICD-10-CM | POA: Diagnosis not present

## 2021-11-27 DIAGNOSIS — I10 Essential (primary) hypertension: Secondary | ICD-10-CM | POA: Diagnosis not present

## 2021-11-27 DIAGNOSIS — E1142 Type 2 diabetes mellitus with diabetic polyneuropathy: Secondary | ICD-10-CM | POA: Diagnosis not present

## 2021-11-27 DIAGNOSIS — K219 Gastro-esophageal reflux disease without esophagitis: Secondary | ICD-10-CM | POA: Diagnosis not present

## 2021-11-28 DIAGNOSIS — Z7901 Long term (current) use of anticoagulants: Secondary | ICD-10-CM | POA: Diagnosis not present

## 2021-11-29 DIAGNOSIS — I4891 Unspecified atrial fibrillation: Secondary | ICD-10-CM | POA: Diagnosis not present

## 2021-11-29 DIAGNOSIS — Z7901 Long term (current) use of anticoagulants: Secondary | ICD-10-CM | POA: Diagnosis not present

## 2021-12-04 DIAGNOSIS — I2510 Atherosclerotic heart disease of native coronary artery without angina pectoris: Secondary | ICD-10-CM | POA: Diagnosis not present

## 2021-12-04 DIAGNOSIS — E785 Hyperlipidemia, unspecified: Secondary | ICD-10-CM | POA: Diagnosis not present

## 2021-12-04 DIAGNOSIS — Z7982 Long term (current) use of aspirin: Secondary | ICD-10-CM | POA: Diagnosis not present

## 2021-12-04 DIAGNOSIS — I10 Essential (primary) hypertension: Secondary | ICD-10-CM | POA: Diagnosis not present

## 2021-12-04 DIAGNOSIS — I82409 Acute embolism and thrombosis of unspecified deep veins of unspecified lower extremity: Secondary | ICD-10-CM | POA: Diagnosis not present

## 2021-12-04 DIAGNOSIS — I872 Venous insufficiency (chronic) (peripheral): Secondary | ICD-10-CM | POA: Diagnosis not present

## 2021-12-04 DIAGNOSIS — Z79899 Other long term (current) drug therapy: Secondary | ICD-10-CM | POA: Diagnosis not present

## 2021-12-04 DIAGNOSIS — Z7901 Long term (current) use of anticoagulants: Secondary | ICD-10-CM | POA: Diagnosis not present

## 2021-12-04 DIAGNOSIS — I4819 Other persistent atrial fibrillation: Secondary | ICD-10-CM | POA: Diagnosis not present

## 2021-12-04 DIAGNOSIS — Z955 Presence of coronary angioplasty implant and graft: Secondary | ICD-10-CM | POA: Diagnosis not present

## 2021-12-05 DIAGNOSIS — I10 Essential (primary) hypertension: Secondary | ICD-10-CM | POA: Diagnosis not present

## 2021-12-05 DIAGNOSIS — F32A Depression, unspecified: Secondary | ICD-10-CM | POA: Diagnosis not present

## 2021-12-05 DIAGNOSIS — E1142 Type 2 diabetes mellitus with diabetic polyneuropathy: Secondary | ICD-10-CM | POA: Diagnosis not present

## 2021-12-05 DIAGNOSIS — K219 Gastro-esophageal reflux disease without esophagitis: Secondary | ICD-10-CM | POA: Diagnosis not present

## 2021-12-05 DIAGNOSIS — Z794 Long term (current) use of insulin: Secondary | ICD-10-CM | POA: Diagnosis not present

## 2021-12-05 DIAGNOSIS — I4891 Unspecified atrial fibrillation: Secondary | ICD-10-CM | POA: Diagnosis not present

## 2021-12-05 DIAGNOSIS — N3281 Overactive bladder: Secondary | ICD-10-CM | POA: Diagnosis not present

## 2021-12-05 DIAGNOSIS — I872 Venous insufficiency (chronic) (peripheral): Secondary | ICD-10-CM | POA: Diagnosis not present

## 2021-12-05 DIAGNOSIS — E785 Hyperlipidemia, unspecified: Secondary | ICD-10-CM | POA: Diagnosis not present

## 2021-12-05 DIAGNOSIS — I480 Paroxysmal atrial fibrillation: Secondary | ICD-10-CM | POA: Diagnosis not present

## 2021-12-06 DIAGNOSIS — Z7901 Long term (current) use of anticoagulants: Secondary | ICD-10-CM | POA: Diagnosis not present

## 2021-12-06 DIAGNOSIS — I4891 Unspecified atrial fibrillation: Secondary | ICD-10-CM | POA: Diagnosis not present

## 2021-12-11 DIAGNOSIS — E1142 Type 2 diabetes mellitus with diabetic polyneuropathy: Secondary | ICD-10-CM | POA: Diagnosis not present

## 2021-12-11 DIAGNOSIS — E785 Hyperlipidemia, unspecified: Secondary | ICD-10-CM | POA: Diagnosis not present

## 2021-12-11 DIAGNOSIS — N3281 Overactive bladder: Secondary | ICD-10-CM | POA: Diagnosis not present

## 2021-12-11 DIAGNOSIS — I872 Venous insufficiency (chronic) (peripheral): Secondary | ICD-10-CM | POA: Diagnosis not present

## 2021-12-11 DIAGNOSIS — I10 Essential (primary) hypertension: Secondary | ICD-10-CM | POA: Diagnosis not present

## 2021-12-11 DIAGNOSIS — F32A Depression, unspecified: Secondary | ICD-10-CM | POA: Diagnosis not present

## 2021-12-11 DIAGNOSIS — K219 Gastro-esophageal reflux disease without esophagitis: Secondary | ICD-10-CM | POA: Diagnosis not present

## 2021-12-11 DIAGNOSIS — I4891 Unspecified atrial fibrillation: Secondary | ICD-10-CM | POA: Diagnosis not present

## 2021-12-11 DIAGNOSIS — Z794 Long term (current) use of insulin: Secondary | ICD-10-CM | POA: Diagnosis not present

## 2021-12-12 DIAGNOSIS — Z7901 Long term (current) use of anticoagulants: Secondary | ICD-10-CM | POA: Diagnosis not present

## 2021-12-13 DIAGNOSIS — Z7901 Long term (current) use of anticoagulants: Secondary | ICD-10-CM | POA: Diagnosis not present

## 2021-12-13 DIAGNOSIS — I4891 Unspecified atrial fibrillation: Secondary | ICD-10-CM | POA: Diagnosis not present

## 2021-12-19 DIAGNOSIS — I83223 Varicose veins of left lower extremity with both ulcer of ankle and inflammation: Secondary | ICD-10-CM | POA: Diagnosis not present

## 2021-12-19 DIAGNOSIS — I83213 Varicose veins of right lower extremity with both ulcer of ankle and inflammation: Secondary | ICD-10-CM | POA: Diagnosis not present

## 2021-12-19 DIAGNOSIS — Z7901 Long term (current) use of anticoagulants: Secondary | ICD-10-CM | POA: Diagnosis not present

## 2021-12-20 DIAGNOSIS — I482 Chronic atrial fibrillation, unspecified: Secondary | ICD-10-CM | POA: Diagnosis not present

## 2021-12-20 DIAGNOSIS — Z7901 Long term (current) use of anticoagulants: Secondary | ICD-10-CM | POA: Diagnosis not present

## 2021-12-25 DIAGNOSIS — Z0000 Encounter for general adult medical examination without abnormal findings: Secondary | ICD-10-CM | POA: Diagnosis not present

## 2021-12-25 DIAGNOSIS — E11319 Type 2 diabetes mellitus with unspecified diabetic retinopathy without macular edema: Secondary | ICD-10-CM | POA: Diagnosis not present

## 2021-12-25 DIAGNOSIS — I2510 Atherosclerotic heart disease of native coronary artery without angina pectoris: Secondary | ICD-10-CM | POA: Diagnosis not present

## 2021-12-25 DIAGNOSIS — I87333 Chronic venous hypertension (idiopathic) with ulcer and inflammation of bilateral lower extremity: Secondary | ICD-10-CM | POA: Diagnosis not present

## 2021-12-25 DIAGNOSIS — I509 Heart failure, unspecified: Secondary | ICD-10-CM | POA: Diagnosis not present

## 2021-12-25 DIAGNOSIS — Z7901 Long term (current) use of anticoagulants: Secondary | ICD-10-CM | POA: Diagnosis not present

## 2021-12-25 DIAGNOSIS — E1159 Type 2 diabetes mellitus with other circulatory complications: Secondary | ICD-10-CM | POA: Diagnosis not present

## 2021-12-25 DIAGNOSIS — E11610 Type 2 diabetes mellitus with diabetic neuropathic arthropathy: Secondary | ICD-10-CM | POA: Diagnosis not present

## 2021-12-25 DIAGNOSIS — I82409 Acute embolism and thrombosis of unspecified deep veins of unspecified lower extremity: Secondary | ICD-10-CM | POA: Diagnosis not present

## 2021-12-26 DIAGNOSIS — N3281 Overactive bladder: Secondary | ICD-10-CM | POA: Diagnosis not present

## 2021-12-26 DIAGNOSIS — E1142 Type 2 diabetes mellitus with diabetic polyneuropathy: Secondary | ICD-10-CM | POA: Diagnosis not present

## 2021-12-26 DIAGNOSIS — I872 Venous insufficiency (chronic) (peripheral): Secondary | ICD-10-CM | POA: Diagnosis not present

## 2021-12-26 DIAGNOSIS — F32A Depression, unspecified: Secondary | ICD-10-CM | POA: Diagnosis not present

## 2021-12-26 DIAGNOSIS — I10 Essential (primary) hypertension: Secondary | ICD-10-CM | POA: Diagnosis not present

## 2021-12-26 DIAGNOSIS — E785 Hyperlipidemia, unspecified: Secondary | ICD-10-CM | POA: Diagnosis not present

## 2021-12-26 DIAGNOSIS — K219 Gastro-esophageal reflux disease without esophagitis: Secondary | ICD-10-CM | POA: Diagnosis not present

## 2021-12-26 DIAGNOSIS — Z794 Long term (current) use of insulin: Secondary | ICD-10-CM | POA: Diagnosis not present

## 2021-12-26 DIAGNOSIS — I4891 Unspecified atrial fibrillation: Secondary | ICD-10-CM | POA: Diagnosis not present

## 2021-12-27 DIAGNOSIS — Z7901 Long term (current) use of anticoagulants: Secondary | ICD-10-CM | POA: Diagnosis not present

## 2022-01-01 DIAGNOSIS — I10 Essential (primary) hypertension: Secondary | ICD-10-CM | POA: Diagnosis not present

## 2022-01-01 DIAGNOSIS — E785 Hyperlipidemia, unspecified: Secondary | ICD-10-CM | POA: Diagnosis not present

## 2022-01-01 DIAGNOSIS — I4891 Unspecified atrial fibrillation: Secondary | ICD-10-CM | POA: Diagnosis not present

## 2022-01-01 DIAGNOSIS — F32A Depression, unspecified: Secondary | ICD-10-CM | POA: Diagnosis not present

## 2022-01-01 DIAGNOSIS — Z794 Long term (current) use of insulin: Secondary | ICD-10-CM | POA: Diagnosis not present

## 2022-01-01 DIAGNOSIS — N3281 Overactive bladder: Secondary | ICD-10-CM | POA: Diagnosis not present

## 2022-01-01 DIAGNOSIS — E1142 Type 2 diabetes mellitus with diabetic polyneuropathy: Secondary | ICD-10-CM | POA: Diagnosis not present

## 2022-01-01 DIAGNOSIS — K219 Gastro-esophageal reflux disease without esophagitis: Secondary | ICD-10-CM | POA: Diagnosis not present

## 2022-01-01 DIAGNOSIS — I872 Venous insufficiency (chronic) (peripheral): Secondary | ICD-10-CM | POA: Diagnosis not present

## 2022-01-03 DIAGNOSIS — I480 Paroxysmal atrial fibrillation: Secondary | ICD-10-CM | POA: Diagnosis not present

## 2022-01-07 DIAGNOSIS — I480 Paroxysmal atrial fibrillation: Secondary | ICD-10-CM | POA: Diagnosis not present

## 2022-01-08 DIAGNOSIS — E785 Hyperlipidemia, unspecified: Secondary | ICD-10-CM | POA: Diagnosis not present

## 2022-01-08 DIAGNOSIS — K219 Gastro-esophageal reflux disease without esophagitis: Secondary | ICD-10-CM | POA: Diagnosis not present

## 2022-01-08 DIAGNOSIS — N3281 Overactive bladder: Secondary | ICD-10-CM | POA: Diagnosis not present

## 2022-01-08 DIAGNOSIS — I4891 Unspecified atrial fibrillation: Secondary | ICD-10-CM | POA: Diagnosis not present

## 2022-01-08 DIAGNOSIS — E1142 Type 2 diabetes mellitus with diabetic polyneuropathy: Secondary | ICD-10-CM | POA: Diagnosis not present

## 2022-01-08 DIAGNOSIS — F32A Depression, unspecified: Secondary | ICD-10-CM | POA: Diagnosis not present

## 2022-01-08 DIAGNOSIS — I10 Essential (primary) hypertension: Secondary | ICD-10-CM | POA: Diagnosis not present

## 2022-01-08 DIAGNOSIS — Z794 Long term (current) use of insulin: Secondary | ICD-10-CM | POA: Diagnosis not present

## 2022-01-08 DIAGNOSIS — I872 Venous insufficiency (chronic) (peripheral): Secondary | ICD-10-CM | POA: Diagnosis not present

## 2022-01-14 DIAGNOSIS — Z7901 Long term (current) use of anticoagulants: Secondary | ICD-10-CM | POA: Diagnosis not present

## 2022-01-16 DIAGNOSIS — Z794 Long term (current) use of insulin: Secondary | ICD-10-CM | POA: Diagnosis not present

## 2022-01-16 DIAGNOSIS — I10 Essential (primary) hypertension: Secondary | ICD-10-CM | POA: Diagnosis not present

## 2022-01-16 DIAGNOSIS — I4891 Unspecified atrial fibrillation: Secondary | ICD-10-CM | POA: Diagnosis not present

## 2022-01-16 DIAGNOSIS — F32A Depression, unspecified: Secondary | ICD-10-CM | POA: Diagnosis not present

## 2022-01-16 DIAGNOSIS — I872 Venous insufficiency (chronic) (peripheral): Secondary | ICD-10-CM | POA: Diagnosis not present

## 2022-01-16 DIAGNOSIS — K219 Gastro-esophageal reflux disease without esophagitis: Secondary | ICD-10-CM | POA: Diagnosis not present

## 2022-01-16 DIAGNOSIS — E785 Hyperlipidemia, unspecified: Secondary | ICD-10-CM | POA: Diagnosis not present

## 2022-01-16 DIAGNOSIS — E1142 Type 2 diabetes mellitus with diabetic polyneuropathy: Secondary | ICD-10-CM | POA: Diagnosis not present

## 2022-01-16 DIAGNOSIS — N3281 Overactive bladder: Secondary | ICD-10-CM | POA: Diagnosis not present

## 2022-01-21 DIAGNOSIS — Z1231 Encounter for screening mammogram for malignant neoplasm of breast: Secondary | ICD-10-CM | POA: Diagnosis not present

## 2022-01-23 DIAGNOSIS — Z794 Long term (current) use of insulin: Secondary | ICD-10-CM | POA: Diagnosis not present

## 2022-01-23 DIAGNOSIS — F32A Depression, unspecified: Secondary | ICD-10-CM | POA: Diagnosis not present

## 2022-01-23 DIAGNOSIS — I10 Essential (primary) hypertension: Secondary | ICD-10-CM | POA: Diagnosis not present

## 2022-01-23 DIAGNOSIS — E785 Hyperlipidemia, unspecified: Secondary | ICD-10-CM | POA: Diagnosis not present

## 2022-01-23 DIAGNOSIS — I872 Venous insufficiency (chronic) (peripheral): Secondary | ICD-10-CM | POA: Diagnosis not present

## 2022-01-23 DIAGNOSIS — I4891 Unspecified atrial fibrillation: Secondary | ICD-10-CM | POA: Diagnosis not present

## 2022-01-23 DIAGNOSIS — E1142 Type 2 diabetes mellitus with diabetic polyneuropathy: Secondary | ICD-10-CM | POA: Diagnosis not present

## 2022-01-23 DIAGNOSIS — K219 Gastro-esophageal reflux disease without esophagitis: Secondary | ICD-10-CM | POA: Diagnosis not present

## 2022-01-23 DIAGNOSIS — N3281 Overactive bladder: Secondary | ICD-10-CM | POA: Diagnosis not present

## 2022-01-30 DIAGNOSIS — E1142 Type 2 diabetes mellitus with diabetic polyneuropathy: Secondary | ICD-10-CM | POA: Diagnosis not present

## 2022-01-30 DIAGNOSIS — I872 Venous insufficiency (chronic) (peripheral): Secondary | ICD-10-CM | POA: Diagnosis not present

## 2022-01-30 DIAGNOSIS — I4891 Unspecified atrial fibrillation: Secondary | ICD-10-CM | POA: Diagnosis not present

## 2022-01-30 DIAGNOSIS — E785 Hyperlipidemia, unspecified: Secondary | ICD-10-CM | POA: Diagnosis not present

## 2022-01-30 DIAGNOSIS — F32A Depression, unspecified: Secondary | ICD-10-CM | POA: Diagnosis not present

## 2022-01-30 DIAGNOSIS — Z794 Long term (current) use of insulin: Secondary | ICD-10-CM | POA: Diagnosis not present

## 2022-01-30 DIAGNOSIS — N3281 Overactive bladder: Secondary | ICD-10-CM | POA: Diagnosis not present

## 2022-01-30 DIAGNOSIS — I10 Essential (primary) hypertension: Secondary | ICD-10-CM | POA: Diagnosis not present

## 2022-01-30 DIAGNOSIS — K219 Gastro-esophageal reflux disease without esophagitis: Secondary | ICD-10-CM | POA: Diagnosis not present

## 2022-02-05 DIAGNOSIS — I509 Heart failure, unspecified: Secondary | ICD-10-CM | POA: Diagnosis not present

## 2022-02-05 DIAGNOSIS — E11319 Type 2 diabetes mellitus with unspecified diabetic retinopathy without macular edema: Secondary | ICD-10-CM | POA: Diagnosis not present

## 2022-02-05 DIAGNOSIS — Z7901 Long term (current) use of anticoagulants: Secondary | ICD-10-CM | POA: Diagnosis not present

## 2022-02-05 DIAGNOSIS — H0520 Unspecified exophthalmos: Secondary | ICD-10-CM | POA: Diagnosis not present

## 2022-02-05 DIAGNOSIS — I87333 Chronic venous hypertension (idiopathic) with ulcer and inflammation of bilateral lower extremity: Secondary | ICD-10-CM | POA: Diagnosis not present

## 2022-02-05 DIAGNOSIS — I82409 Acute embolism and thrombosis of unspecified deep veins of unspecified lower extremity: Secondary | ICD-10-CM | POA: Diagnosis not present

## 2022-02-05 DIAGNOSIS — I2510 Atherosclerotic heart disease of native coronary artery without angina pectoris: Secondary | ICD-10-CM | POA: Diagnosis not present

## 2022-02-05 DIAGNOSIS — E1159 Type 2 diabetes mellitus with other circulatory complications: Secondary | ICD-10-CM | POA: Diagnosis not present

## 2022-02-06 DIAGNOSIS — E11319 Type 2 diabetes mellitus with unspecified diabetic retinopathy without macular edema: Secondary | ICD-10-CM | POA: Diagnosis not present

## 2022-02-06 DIAGNOSIS — I82409 Acute embolism and thrombosis of unspecified deep veins of unspecified lower extremity: Secondary | ICD-10-CM | POA: Diagnosis not present

## 2022-02-06 DIAGNOSIS — R7989 Other specified abnormal findings of blood chemistry: Secondary | ICD-10-CM | POA: Diagnosis not present

## 2022-02-06 DIAGNOSIS — E1159 Type 2 diabetes mellitus with other circulatory complications: Secondary | ICD-10-CM | POA: Diagnosis not present

## 2022-02-06 DIAGNOSIS — Z7901 Long term (current) use of anticoagulants: Secondary | ICD-10-CM | POA: Diagnosis not present

## 2022-02-06 DIAGNOSIS — I10 Essential (primary) hypertension: Secondary | ICD-10-CM | POA: Diagnosis not present

## 2022-02-07 DIAGNOSIS — I10 Essential (primary) hypertension: Secondary | ICD-10-CM | POA: Diagnosis not present

## 2022-02-07 DIAGNOSIS — E1142 Type 2 diabetes mellitus with diabetic polyneuropathy: Secondary | ICD-10-CM | POA: Diagnosis not present

## 2022-02-07 DIAGNOSIS — K219 Gastro-esophageal reflux disease without esophagitis: Secondary | ICD-10-CM | POA: Diagnosis not present

## 2022-02-07 DIAGNOSIS — Z794 Long term (current) use of insulin: Secondary | ICD-10-CM | POA: Diagnosis not present

## 2022-02-07 DIAGNOSIS — E785 Hyperlipidemia, unspecified: Secondary | ICD-10-CM | POA: Diagnosis not present

## 2022-02-07 DIAGNOSIS — N3281 Overactive bladder: Secondary | ICD-10-CM | POA: Diagnosis not present

## 2022-02-07 DIAGNOSIS — F32A Depression, unspecified: Secondary | ICD-10-CM | POA: Diagnosis not present

## 2022-02-07 DIAGNOSIS — I4891 Unspecified atrial fibrillation: Secondary | ICD-10-CM | POA: Diagnosis not present

## 2022-02-07 DIAGNOSIS — I872 Venous insufficiency (chronic) (peripheral): Secondary | ICD-10-CM | POA: Diagnosis not present

## 2022-02-09 DIAGNOSIS — I252 Old myocardial infarction: Secondary | ICD-10-CM | POA: Diagnosis not present

## 2022-02-09 DIAGNOSIS — N764 Abscess of vulva: Secondary | ICD-10-CM | POA: Diagnosis not present

## 2022-02-09 DIAGNOSIS — G4489 Other headache syndrome: Secondary | ICD-10-CM | POA: Diagnosis not present

## 2022-02-09 DIAGNOSIS — I10 Essential (primary) hypertension: Secondary | ICD-10-CM | POA: Diagnosis not present

## 2022-02-09 DIAGNOSIS — E119 Type 2 diabetes mellitus without complications: Secondary | ICD-10-CM | POA: Diagnosis not present

## 2022-02-09 DIAGNOSIS — Z794 Long term (current) use of insulin: Secondary | ICD-10-CM | POA: Diagnosis not present

## 2022-02-09 DIAGNOSIS — Z888 Allergy status to other drugs, medicaments and biological substances status: Secondary | ICD-10-CM | POA: Diagnosis not present

## 2022-02-09 DIAGNOSIS — N390 Urinary tract infection, site not specified: Secondary | ICD-10-CM | POA: Diagnosis not present

## 2022-02-13 DIAGNOSIS — E1142 Type 2 diabetes mellitus with diabetic polyneuropathy: Secondary | ICD-10-CM | POA: Diagnosis not present

## 2022-02-13 DIAGNOSIS — I872 Venous insufficiency (chronic) (peripheral): Secondary | ICD-10-CM | POA: Diagnosis not present

## 2022-02-13 DIAGNOSIS — N3281 Overactive bladder: Secondary | ICD-10-CM | POA: Diagnosis not present

## 2022-02-13 DIAGNOSIS — E785 Hyperlipidemia, unspecified: Secondary | ICD-10-CM | POA: Diagnosis not present

## 2022-02-13 DIAGNOSIS — F32A Depression, unspecified: Secondary | ICD-10-CM | POA: Diagnosis not present

## 2022-02-13 DIAGNOSIS — Z794 Long term (current) use of insulin: Secondary | ICD-10-CM | POA: Diagnosis not present

## 2022-02-13 DIAGNOSIS — I4891 Unspecified atrial fibrillation: Secondary | ICD-10-CM | POA: Diagnosis not present

## 2022-02-13 DIAGNOSIS — I10 Essential (primary) hypertension: Secondary | ICD-10-CM | POA: Diagnosis not present

## 2022-02-13 DIAGNOSIS — K219 Gastro-esophageal reflux disease without esophagitis: Secondary | ICD-10-CM | POA: Diagnosis not present

## 2022-02-14 DIAGNOSIS — Z7901 Long term (current) use of anticoagulants: Secondary | ICD-10-CM | POA: Diagnosis not present

## 2022-02-20 DIAGNOSIS — I83223 Varicose veins of left lower extremity with both ulcer of ankle and inflammation: Secondary | ICD-10-CM | POA: Diagnosis not present

## 2022-02-20 DIAGNOSIS — I83213 Varicose veins of right lower extremity with both ulcer of ankle and inflammation: Secondary | ICD-10-CM | POA: Diagnosis not present

## 2022-02-20 DIAGNOSIS — R601 Generalized edema: Secondary | ICD-10-CM | POA: Diagnosis not present

## 2022-02-21 DIAGNOSIS — E1165 Type 2 diabetes mellitus with hyperglycemia: Secondary | ICD-10-CM | POA: Diagnosis not present

## 2022-02-21 DIAGNOSIS — I119 Hypertensive heart disease without heart failure: Secondary | ICD-10-CM | POA: Diagnosis not present

## 2022-02-21 DIAGNOSIS — Z794 Long term (current) use of insulin: Secondary | ICD-10-CM | POA: Diagnosis not present

## 2022-02-21 DIAGNOSIS — I82509 Chronic embolism and thrombosis of unspecified deep veins of unspecified lower extremity: Secondary | ICD-10-CM | POA: Diagnosis not present

## 2022-02-21 DIAGNOSIS — E11319 Type 2 diabetes mellitus with unspecified diabetic retinopathy without macular edema: Secondary | ICD-10-CM | POA: Diagnosis not present

## 2022-02-21 DIAGNOSIS — E1159 Type 2 diabetes mellitus with other circulatory complications: Secondary | ICD-10-CM | POA: Diagnosis not present

## 2022-02-21 DIAGNOSIS — Z7901 Long term (current) use of anticoagulants: Secondary | ICD-10-CM | POA: Diagnosis not present

## 2022-02-21 DIAGNOSIS — R399 Unspecified symptoms and signs involving the genitourinary system: Secondary | ICD-10-CM | POA: Diagnosis not present

## 2022-02-21 DIAGNOSIS — Z79899 Other long term (current) drug therapy: Secondary | ICD-10-CM | POA: Diagnosis not present

## 2022-02-27 DIAGNOSIS — N3281 Overactive bladder: Secondary | ICD-10-CM | POA: Diagnosis not present

## 2022-02-27 DIAGNOSIS — I4891 Unspecified atrial fibrillation: Secondary | ICD-10-CM | POA: Diagnosis not present

## 2022-02-27 DIAGNOSIS — F32A Depression, unspecified: Secondary | ICD-10-CM | POA: Diagnosis not present

## 2022-02-27 DIAGNOSIS — K219 Gastro-esophageal reflux disease without esophagitis: Secondary | ICD-10-CM | POA: Diagnosis not present

## 2022-02-27 DIAGNOSIS — E1142 Type 2 diabetes mellitus with diabetic polyneuropathy: Secondary | ICD-10-CM | POA: Diagnosis not present

## 2022-02-27 DIAGNOSIS — E785 Hyperlipidemia, unspecified: Secondary | ICD-10-CM | POA: Diagnosis not present

## 2022-02-27 DIAGNOSIS — I872 Venous insufficiency (chronic) (peripheral): Secondary | ICD-10-CM | POA: Diagnosis not present

## 2022-02-27 DIAGNOSIS — I10 Essential (primary) hypertension: Secondary | ICD-10-CM | POA: Diagnosis not present

## 2022-02-27 DIAGNOSIS — Z794 Long term (current) use of insulin: Secondary | ICD-10-CM | POA: Diagnosis not present

## 2022-03-06 DIAGNOSIS — I10 Essential (primary) hypertension: Secondary | ICD-10-CM | POA: Diagnosis not present

## 2022-03-06 DIAGNOSIS — K219 Gastro-esophageal reflux disease without esophagitis: Secondary | ICD-10-CM | POA: Diagnosis not present

## 2022-03-06 DIAGNOSIS — E1142 Type 2 diabetes mellitus with diabetic polyneuropathy: Secondary | ICD-10-CM | POA: Diagnosis not present

## 2022-03-06 DIAGNOSIS — Z794 Long term (current) use of insulin: Secondary | ICD-10-CM | POA: Diagnosis not present

## 2022-03-06 DIAGNOSIS — E785 Hyperlipidemia, unspecified: Secondary | ICD-10-CM | POA: Diagnosis not present

## 2022-03-06 DIAGNOSIS — I872 Venous insufficiency (chronic) (peripheral): Secondary | ICD-10-CM | POA: Diagnosis not present

## 2022-03-06 DIAGNOSIS — N3281 Overactive bladder: Secondary | ICD-10-CM | POA: Diagnosis not present

## 2022-03-06 DIAGNOSIS — F32A Depression, unspecified: Secondary | ICD-10-CM | POA: Diagnosis not present

## 2022-03-06 DIAGNOSIS — I4891 Unspecified atrial fibrillation: Secondary | ICD-10-CM | POA: Diagnosis not present

## 2022-03-08 DIAGNOSIS — Z7901 Long term (current) use of anticoagulants: Secondary | ICD-10-CM | POA: Diagnosis not present

## 2022-03-12 DIAGNOSIS — I872 Venous insufficiency (chronic) (peripheral): Secondary | ICD-10-CM | POA: Diagnosis not present

## 2022-03-12 DIAGNOSIS — I10 Essential (primary) hypertension: Secondary | ICD-10-CM | POA: Diagnosis not present

## 2022-03-12 DIAGNOSIS — E785 Hyperlipidemia, unspecified: Secondary | ICD-10-CM | POA: Diagnosis not present

## 2022-03-12 DIAGNOSIS — I4891 Unspecified atrial fibrillation: Secondary | ICD-10-CM | POA: Diagnosis not present

## 2022-03-12 DIAGNOSIS — K219 Gastro-esophageal reflux disease without esophagitis: Secondary | ICD-10-CM | POA: Diagnosis not present

## 2022-03-12 DIAGNOSIS — Z794 Long term (current) use of insulin: Secondary | ICD-10-CM | POA: Diagnosis not present

## 2022-03-12 DIAGNOSIS — N3281 Overactive bladder: Secondary | ICD-10-CM | POA: Diagnosis not present

## 2022-03-12 DIAGNOSIS — F32A Depression, unspecified: Secondary | ICD-10-CM | POA: Diagnosis not present

## 2022-03-12 DIAGNOSIS — E1142 Type 2 diabetes mellitus with diabetic polyneuropathy: Secondary | ICD-10-CM | POA: Diagnosis not present

## 2022-03-15 DIAGNOSIS — E11319 Type 2 diabetes mellitus with unspecified diabetic retinopathy without macular edema: Secondary | ICD-10-CM | POA: Diagnosis not present

## 2022-03-15 DIAGNOSIS — R7989 Other specified abnormal findings of blood chemistry: Secondary | ICD-10-CM | POA: Diagnosis not present

## 2022-03-15 DIAGNOSIS — Z7901 Long term (current) use of anticoagulants: Secondary | ICD-10-CM | POA: Diagnosis not present

## 2022-03-20 DIAGNOSIS — I10 Essential (primary) hypertension: Secondary | ICD-10-CM | POA: Diagnosis not present

## 2022-03-20 DIAGNOSIS — N3281 Overactive bladder: Secondary | ICD-10-CM | POA: Diagnosis not present

## 2022-03-20 DIAGNOSIS — Z794 Long term (current) use of insulin: Secondary | ICD-10-CM | POA: Diagnosis not present

## 2022-03-20 DIAGNOSIS — I872 Venous insufficiency (chronic) (peripheral): Secondary | ICD-10-CM | POA: Diagnosis not present

## 2022-03-20 DIAGNOSIS — E785 Hyperlipidemia, unspecified: Secondary | ICD-10-CM | POA: Diagnosis not present

## 2022-03-20 DIAGNOSIS — I4891 Unspecified atrial fibrillation: Secondary | ICD-10-CM | POA: Diagnosis not present

## 2022-03-20 DIAGNOSIS — F32A Depression, unspecified: Secondary | ICD-10-CM | POA: Diagnosis not present

## 2022-03-20 DIAGNOSIS — E1142 Type 2 diabetes mellitus with diabetic polyneuropathy: Secondary | ICD-10-CM | POA: Diagnosis not present

## 2022-03-20 DIAGNOSIS — K219 Gastro-esophageal reflux disease without esophagitis: Secondary | ICD-10-CM | POA: Diagnosis not present

## 2022-03-21 DIAGNOSIS — I110 Hypertensive heart disease with heart failure: Secondary | ICD-10-CM | POA: Diagnosis not present

## 2022-03-21 DIAGNOSIS — E1159 Type 2 diabetes mellitus with other circulatory complications: Secondary | ICD-10-CM | POA: Diagnosis not present

## 2022-03-21 DIAGNOSIS — I87333 Chronic venous hypertension (idiopathic) with ulcer and inflammation of bilateral lower extremity: Secondary | ICD-10-CM | POA: Diagnosis not present

## 2022-03-21 DIAGNOSIS — I509 Heart failure, unspecified: Secondary | ICD-10-CM | POA: Diagnosis not present

## 2022-03-21 DIAGNOSIS — E11319 Type 2 diabetes mellitus with unspecified diabetic retinopathy without macular edema: Secondary | ICD-10-CM | POA: Diagnosis not present

## 2022-03-21 DIAGNOSIS — Z7901 Long term (current) use of anticoagulants: Secondary | ICD-10-CM | POA: Diagnosis not present

## 2022-03-21 DIAGNOSIS — E0590 Thyrotoxicosis, unspecified without thyrotoxic crisis or storm: Secondary | ICD-10-CM | POA: Diagnosis not present

## 2022-03-21 DIAGNOSIS — I82409 Acute embolism and thrombosis of unspecified deep veins of unspecified lower extremity: Secondary | ICD-10-CM | POA: Diagnosis not present

## 2022-03-21 DIAGNOSIS — Z79899 Other long term (current) drug therapy: Secondary | ICD-10-CM | POA: Diagnosis not present

## 2022-03-27 DIAGNOSIS — F32A Depression, unspecified: Secondary | ICD-10-CM | POA: Diagnosis not present

## 2022-03-27 DIAGNOSIS — I4891 Unspecified atrial fibrillation: Secondary | ICD-10-CM | POA: Diagnosis not present

## 2022-03-27 DIAGNOSIS — I10 Essential (primary) hypertension: Secondary | ICD-10-CM | POA: Diagnosis not present

## 2022-03-27 DIAGNOSIS — K219 Gastro-esophageal reflux disease without esophagitis: Secondary | ICD-10-CM | POA: Diagnosis not present

## 2022-03-27 DIAGNOSIS — E1142 Type 2 diabetes mellitus with diabetic polyneuropathy: Secondary | ICD-10-CM | POA: Diagnosis not present

## 2022-03-27 DIAGNOSIS — I872 Venous insufficiency (chronic) (peripheral): Secondary | ICD-10-CM | POA: Diagnosis not present

## 2022-03-27 DIAGNOSIS — N3281 Overactive bladder: Secondary | ICD-10-CM | POA: Diagnosis not present

## 2022-03-27 DIAGNOSIS — Z794 Long term (current) use of insulin: Secondary | ICD-10-CM | POA: Diagnosis not present

## 2022-03-27 DIAGNOSIS — E785 Hyperlipidemia, unspecified: Secondary | ICD-10-CM | POA: Diagnosis not present

## 2022-04-03 DIAGNOSIS — E785 Hyperlipidemia, unspecified: Secondary | ICD-10-CM | POA: Diagnosis not present

## 2022-04-03 DIAGNOSIS — F32A Depression, unspecified: Secondary | ICD-10-CM | POA: Diagnosis not present

## 2022-04-03 DIAGNOSIS — K219 Gastro-esophageal reflux disease without esophagitis: Secondary | ICD-10-CM | POA: Diagnosis not present

## 2022-04-03 DIAGNOSIS — E1142 Type 2 diabetes mellitus with diabetic polyneuropathy: Secondary | ICD-10-CM | POA: Diagnosis not present

## 2022-04-03 DIAGNOSIS — I4891 Unspecified atrial fibrillation: Secondary | ICD-10-CM | POA: Diagnosis not present

## 2022-04-03 DIAGNOSIS — N3281 Overactive bladder: Secondary | ICD-10-CM | POA: Diagnosis not present

## 2022-04-03 DIAGNOSIS — I10 Essential (primary) hypertension: Secondary | ICD-10-CM | POA: Diagnosis not present

## 2022-04-03 DIAGNOSIS — Z794 Long term (current) use of insulin: Secondary | ICD-10-CM | POA: Diagnosis not present

## 2022-04-03 DIAGNOSIS — I872 Venous insufficiency (chronic) (peripheral): Secondary | ICD-10-CM | POA: Diagnosis not present

## 2022-04-10 DIAGNOSIS — I10 Essential (primary) hypertension: Secondary | ICD-10-CM | POA: Diagnosis not present

## 2022-04-10 DIAGNOSIS — I872 Venous insufficiency (chronic) (peripheral): Secondary | ICD-10-CM | POA: Diagnosis not present

## 2022-04-10 DIAGNOSIS — F32A Depression, unspecified: Secondary | ICD-10-CM | POA: Diagnosis not present

## 2022-04-10 DIAGNOSIS — K219 Gastro-esophageal reflux disease without esophagitis: Secondary | ICD-10-CM | POA: Diagnosis not present

## 2022-04-10 DIAGNOSIS — Z794 Long term (current) use of insulin: Secondary | ICD-10-CM | POA: Diagnosis not present

## 2022-04-10 DIAGNOSIS — I4891 Unspecified atrial fibrillation: Secondary | ICD-10-CM | POA: Diagnosis not present

## 2022-04-10 DIAGNOSIS — N3281 Overactive bladder: Secondary | ICD-10-CM | POA: Diagnosis not present

## 2022-04-10 DIAGNOSIS — E1142 Type 2 diabetes mellitus with diabetic polyneuropathy: Secondary | ICD-10-CM | POA: Diagnosis not present

## 2022-04-10 DIAGNOSIS — E785 Hyperlipidemia, unspecified: Secondary | ICD-10-CM | POA: Diagnosis not present

## 2022-04-16 DIAGNOSIS — Z7901 Long term (current) use of anticoagulants: Secondary | ICD-10-CM | POA: Diagnosis not present

## 2022-04-17 DIAGNOSIS — K219 Gastro-esophageal reflux disease without esophagitis: Secondary | ICD-10-CM | POA: Diagnosis not present

## 2022-04-17 DIAGNOSIS — Z794 Long term (current) use of insulin: Secondary | ICD-10-CM | POA: Diagnosis not present

## 2022-04-17 DIAGNOSIS — E1142 Type 2 diabetes mellitus with diabetic polyneuropathy: Secondary | ICD-10-CM | POA: Diagnosis not present

## 2022-04-17 DIAGNOSIS — I872 Venous insufficiency (chronic) (peripheral): Secondary | ICD-10-CM | POA: Diagnosis not present

## 2022-04-17 DIAGNOSIS — E785 Hyperlipidemia, unspecified: Secondary | ICD-10-CM | POA: Diagnosis not present

## 2022-04-17 DIAGNOSIS — I4891 Unspecified atrial fibrillation: Secondary | ICD-10-CM | POA: Diagnosis not present

## 2022-04-17 DIAGNOSIS — F32A Depression, unspecified: Secondary | ICD-10-CM | POA: Diagnosis not present

## 2022-04-17 DIAGNOSIS — N3281 Overactive bladder: Secondary | ICD-10-CM | POA: Diagnosis not present

## 2022-04-17 DIAGNOSIS — I10 Essential (primary) hypertension: Secondary | ICD-10-CM | POA: Diagnosis not present

## 2022-04-24 DIAGNOSIS — N3281 Overactive bladder: Secondary | ICD-10-CM | POA: Diagnosis not present

## 2022-04-24 DIAGNOSIS — I4891 Unspecified atrial fibrillation: Secondary | ICD-10-CM | POA: Diagnosis not present

## 2022-04-24 DIAGNOSIS — E1142 Type 2 diabetes mellitus with diabetic polyneuropathy: Secondary | ICD-10-CM | POA: Diagnosis not present

## 2022-04-24 DIAGNOSIS — I872 Venous insufficiency (chronic) (peripheral): Secondary | ICD-10-CM | POA: Diagnosis not present

## 2022-04-24 DIAGNOSIS — E785 Hyperlipidemia, unspecified: Secondary | ICD-10-CM | POA: Diagnosis not present

## 2022-04-24 DIAGNOSIS — F32A Depression, unspecified: Secondary | ICD-10-CM | POA: Diagnosis not present

## 2022-04-24 DIAGNOSIS — I10 Essential (primary) hypertension: Secondary | ICD-10-CM | POA: Diagnosis not present

## 2022-04-24 DIAGNOSIS — K219 Gastro-esophageal reflux disease without esophagitis: Secondary | ICD-10-CM | POA: Diagnosis not present

## 2022-04-24 DIAGNOSIS — Z794 Long term (current) use of insulin: Secondary | ICD-10-CM | POA: Diagnosis not present

## 2022-05-01 DIAGNOSIS — I4891 Unspecified atrial fibrillation: Secondary | ICD-10-CM | POA: Diagnosis not present

## 2022-05-01 DIAGNOSIS — F32A Depression, unspecified: Secondary | ICD-10-CM | POA: Diagnosis not present

## 2022-05-01 DIAGNOSIS — Z794 Long term (current) use of insulin: Secondary | ICD-10-CM | POA: Diagnosis not present

## 2022-05-01 DIAGNOSIS — K219 Gastro-esophageal reflux disease without esophagitis: Secondary | ICD-10-CM | POA: Diagnosis not present

## 2022-05-01 DIAGNOSIS — I872 Venous insufficiency (chronic) (peripheral): Secondary | ICD-10-CM | POA: Diagnosis not present

## 2022-05-01 DIAGNOSIS — E1142 Type 2 diabetes mellitus with diabetic polyneuropathy: Secondary | ICD-10-CM | POA: Diagnosis not present

## 2022-05-01 DIAGNOSIS — N3281 Overactive bladder: Secondary | ICD-10-CM | POA: Diagnosis not present

## 2022-05-01 DIAGNOSIS — I10 Essential (primary) hypertension: Secondary | ICD-10-CM | POA: Diagnosis not present

## 2022-05-01 DIAGNOSIS — E785 Hyperlipidemia, unspecified: Secondary | ICD-10-CM | POA: Diagnosis not present

## 2022-05-08 DIAGNOSIS — N3281 Overactive bladder: Secondary | ICD-10-CM | POA: Diagnosis not present

## 2022-05-08 DIAGNOSIS — I10 Essential (primary) hypertension: Secondary | ICD-10-CM | POA: Diagnosis not present

## 2022-05-08 DIAGNOSIS — F32A Depression, unspecified: Secondary | ICD-10-CM | POA: Diagnosis not present

## 2022-05-08 DIAGNOSIS — E785 Hyperlipidemia, unspecified: Secondary | ICD-10-CM | POA: Diagnosis not present

## 2022-05-08 DIAGNOSIS — K219 Gastro-esophageal reflux disease without esophagitis: Secondary | ICD-10-CM | POA: Diagnosis not present

## 2022-05-08 DIAGNOSIS — Z794 Long term (current) use of insulin: Secondary | ICD-10-CM | POA: Diagnosis not present

## 2022-05-08 DIAGNOSIS — I872 Venous insufficiency (chronic) (peripheral): Secondary | ICD-10-CM | POA: Diagnosis not present

## 2022-05-08 DIAGNOSIS — I4891 Unspecified atrial fibrillation: Secondary | ICD-10-CM | POA: Diagnosis not present

## 2022-05-08 DIAGNOSIS — E1142 Type 2 diabetes mellitus with diabetic polyneuropathy: Secondary | ICD-10-CM | POA: Diagnosis not present

## 2022-05-15 DIAGNOSIS — F32A Depression, unspecified: Secondary | ICD-10-CM | POA: Diagnosis not present

## 2022-05-15 DIAGNOSIS — N3281 Overactive bladder: Secondary | ICD-10-CM | POA: Diagnosis not present

## 2022-05-15 DIAGNOSIS — E785 Hyperlipidemia, unspecified: Secondary | ICD-10-CM | POA: Diagnosis not present

## 2022-05-15 DIAGNOSIS — K219 Gastro-esophageal reflux disease without esophagitis: Secondary | ICD-10-CM | POA: Diagnosis not present

## 2022-05-15 DIAGNOSIS — I10 Essential (primary) hypertension: Secondary | ICD-10-CM | POA: Diagnosis not present

## 2022-05-15 DIAGNOSIS — I872 Venous insufficiency (chronic) (peripheral): Secondary | ICD-10-CM | POA: Diagnosis not present

## 2022-05-15 DIAGNOSIS — Z794 Long term (current) use of insulin: Secondary | ICD-10-CM | POA: Diagnosis not present

## 2022-05-15 DIAGNOSIS — I4891 Unspecified atrial fibrillation: Secondary | ICD-10-CM | POA: Diagnosis not present

## 2022-05-15 DIAGNOSIS — E1142 Type 2 diabetes mellitus with diabetic polyneuropathy: Secondary | ICD-10-CM | POA: Diagnosis not present

## 2022-05-20 DIAGNOSIS — I82409 Acute embolism and thrombosis of unspecified deep veins of unspecified lower extremity: Secondary | ICD-10-CM | POA: Diagnosis not present

## 2022-05-22 DIAGNOSIS — E1142 Type 2 diabetes mellitus with diabetic polyneuropathy: Secondary | ICD-10-CM | POA: Diagnosis not present

## 2022-05-22 DIAGNOSIS — I4891 Unspecified atrial fibrillation: Secondary | ICD-10-CM | POA: Diagnosis not present

## 2022-05-22 DIAGNOSIS — I872 Venous insufficiency (chronic) (peripheral): Secondary | ICD-10-CM | POA: Diagnosis not present

## 2022-05-22 DIAGNOSIS — I10 Essential (primary) hypertension: Secondary | ICD-10-CM | POA: Diagnosis not present

## 2022-05-22 DIAGNOSIS — E785 Hyperlipidemia, unspecified: Secondary | ICD-10-CM | POA: Diagnosis not present

## 2022-05-22 DIAGNOSIS — Z794 Long term (current) use of insulin: Secondary | ICD-10-CM | POA: Diagnosis not present

## 2022-05-22 DIAGNOSIS — N3281 Overactive bladder: Secondary | ICD-10-CM | POA: Diagnosis not present

## 2022-05-22 DIAGNOSIS — F32A Depression, unspecified: Secondary | ICD-10-CM | POA: Diagnosis not present

## 2022-05-22 DIAGNOSIS — K219 Gastro-esophageal reflux disease without esophagitis: Secondary | ICD-10-CM | POA: Diagnosis not present

## 2022-05-29 DIAGNOSIS — Z794 Long term (current) use of insulin: Secondary | ICD-10-CM | POA: Diagnosis not present

## 2022-05-29 DIAGNOSIS — F32A Depression, unspecified: Secondary | ICD-10-CM | POA: Diagnosis not present

## 2022-05-29 DIAGNOSIS — K219 Gastro-esophageal reflux disease without esophagitis: Secondary | ICD-10-CM | POA: Diagnosis not present

## 2022-05-29 DIAGNOSIS — I10 Essential (primary) hypertension: Secondary | ICD-10-CM | POA: Diagnosis not present

## 2022-05-29 DIAGNOSIS — E785 Hyperlipidemia, unspecified: Secondary | ICD-10-CM | POA: Diagnosis not present

## 2022-05-29 DIAGNOSIS — I4891 Unspecified atrial fibrillation: Secondary | ICD-10-CM | POA: Diagnosis not present

## 2022-05-29 DIAGNOSIS — N3281 Overactive bladder: Secondary | ICD-10-CM | POA: Diagnosis not present

## 2022-05-29 DIAGNOSIS — I872 Venous insufficiency (chronic) (peripheral): Secondary | ICD-10-CM | POA: Diagnosis not present

## 2022-05-29 DIAGNOSIS — E1142 Type 2 diabetes mellitus with diabetic polyneuropathy: Secondary | ICD-10-CM | POA: Diagnosis not present

## 2022-05-31 DIAGNOSIS — Z794 Long term (current) use of insulin: Secondary | ICD-10-CM | POA: Diagnosis not present

## 2022-05-31 DIAGNOSIS — K219 Gastro-esophageal reflux disease without esophagitis: Secondary | ICD-10-CM | POA: Diagnosis not present

## 2022-05-31 DIAGNOSIS — F32A Depression, unspecified: Secondary | ICD-10-CM | POA: Diagnosis not present

## 2022-05-31 DIAGNOSIS — I872 Venous insufficiency (chronic) (peripheral): Secondary | ICD-10-CM | POA: Diagnosis not present

## 2022-05-31 DIAGNOSIS — E785 Hyperlipidemia, unspecified: Secondary | ICD-10-CM | POA: Diagnosis not present

## 2022-05-31 DIAGNOSIS — I10 Essential (primary) hypertension: Secondary | ICD-10-CM | POA: Diagnosis not present

## 2022-05-31 DIAGNOSIS — I4891 Unspecified atrial fibrillation: Secondary | ICD-10-CM | POA: Diagnosis not present

## 2022-05-31 DIAGNOSIS — N3281 Overactive bladder: Secondary | ICD-10-CM | POA: Diagnosis not present

## 2022-05-31 DIAGNOSIS — E1142 Type 2 diabetes mellitus with diabetic polyneuropathy: Secondary | ICD-10-CM | POA: Diagnosis not present

## 2022-06-05 DIAGNOSIS — E1142 Type 2 diabetes mellitus with diabetic polyneuropathy: Secondary | ICD-10-CM | POA: Diagnosis not present

## 2022-06-05 DIAGNOSIS — I4891 Unspecified atrial fibrillation: Secondary | ICD-10-CM | POA: Diagnosis not present

## 2022-06-05 DIAGNOSIS — N3281 Overactive bladder: Secondary | ICD-10-CM | POA: Diagnosis not present

## 2022-06-05 DIAGNOSIS — I10 Essential (primary) hypertension: Secondary | ICD-10-CM | POA: Diagnosis not present

## 2022-06-05 DIAGNOSIS — Z794 Long term (current) use of insulin: Secondary | ICD-10-CM | POA: Diagnosis not present

## 2022-06-05 DIAGNOSIS — K219 Gastro-esophageal reflux disease without esophagitis: Secondary | ICD-10-CM | POA: Diagnosis not present

## 2022-06-05 DIAGNOSIS — F32A Depression, unspecified: Secondary | ICD-10-CM | POA: Diagnosis not present

## 2022-06-05 DIAGNOSIS — E785 Hyperlipidemia, unspecified: Secondary | ICD-10-CM | POA: Diagnosis not present

## 2022-06-05 DIAGNOSIS — I872 Venous insufficiency (chronic) (peripheral): Secondary | ICD-10-CM | POA: Diagnosis not present

## 2022-06-11 ENCOUNTER — Telehealth: Payer: Self-pay | Admitting: *Deleted

## 2022-06-11 NOTE — Patient Outreach (Signed)
  Care Coordination   06/11/2022 Name: AIZA VOLLRATH MRN: [id number] DOB: 21-May-1936   Care Coordination Outreach Attempts:  An unsuccessful telephone outreach was attempted today to offer the patient information about available care coordination services as a benefit of their health plan.   Follow Up Plan:  Additional outreach attempts will be made to offer the patient care coordination information and services.   Encounter Outcome:  No Answer  Care Coordination Interventions Activated:  No   Care Coordination Interventions:  No, not indicated    Valente David, RN, MSN, Care [facility] Care Management Care Management Coordinator [phone]

## 2022-06-12 DIAGNOSIS — E785 Hyperlipidemia, unspecified: Secondary | ICD-10-CM | POA: Diagnosis not present

## 2022-06-12 DIAGNOSIS — K219 Gastro-esophageal reflux disease without esophagitis: Secondary | ICD-10-CM | POA: Diagnosis not present

## 2022-06-12 DIAGNOSIS — Z794 Long term (current) use of insulin: Secondary | ICD-10-CM | POA: Diagnosis not present

## 2022-06-12 DIAGNOSIS — E1142 Type 2 diabetes mellitus with diabetic polyneuropathy: Secondary | ICD-10-CM | POA: Diagnosis not present

## 2022-06-12 DIAGNOSIS — I4891 Unspecified atrial fibrillation: Secondary | ICD-10-CM | POA: Diagnosis not present

## 2022-06-12 DIAGNOSIS — F32A Depression, unspecified: Secondary | ICD-10-CM | POA: Diagnosis not present

## 2022-06-12 DIAGNOSIS — I872 Venous insufficiency (chronic) (peripheral): Secondary | ICD-10-CM | POA: Diagnosis not present

## 2022-06-12 DIAGNOSIS — N3281 Overactive bladder: Secondary | ICD-10-CM | POA: Diagnosis not present

## 2022-06-12 DIAGNOSIS — I10 Essential (primary) hypertension: Secondary | ICD-10-CM | POA: Diagnosis not present

## 2022-06-13 ENCOUNTER — Telehealth: Payer: Self-pay | Admitting: *Deleted

## 2022-06-13 NOTE — Patient Outreach (Signed)
  Care Coordination   06/13/2022 Name: DEIJAH SPIKES MRN: [id number] DOB: [birth year]   Care Coordination Outreach Attempts:  A second unsuccessful outreach was attempted today to offer the patient with information about available care coordination services as a benefit of their health plan.     Follow Up Plan:  Additional outreach attempts will be made to offer the patient care coordination information and services.   Encounter Outcome:  No Answer  Care Coordination Interventions Activated:  No   Care Coordination Interventions:  No, not indicated    Valente David, RN, MSN, [facility] Care Management Care Management Coordinator [phone]

## 2022-06-18 ENCOUNTER — Telehealth: Payer: Self-pay | Admitting: *Deleted

## 2022-06-18 NOTE — Patient Outreach (Signed)
  Care Coordination   Initial Visit Note   06/18/2022 Name: Stephanie Orozco MRN: [id number] DOB: 14-Nov-1935  Stephanie Orozco is a 86 y.o. year old female who sees Stephanie Blitz, MD for primary care. I spoke with  Stephanie Orozco by phone today.  What matters to the patients health and wellness today?  Patient now lives in [place] in [place], changed PCP.  No longer eligible for services.    SDOH assessments and interventions completed:  No     Care Coordination Interventions Activated:  No  Care Coordination Interventions:  No, not indicated   Follow up plan: No further intervention required.   Encounter Outcome:  Pt. Visit Completed   Stephanie David, RN, MSN, [place] Care Management Care Management Coordinator [phone]

## 2022-06-19 DIAGNOSIS — K219 Gastro-esophageal reflux disease without esophagitis: Secondary | ICD-10-CM | POA: Diagnosis not present

## 2022-06-19 DIAGNOSIS — E1142 Type 2 diabetes mellitus with diabetic polyneuropathy: Secondary | ICD-10-CM | POA: Diagnosis not present

## 2022-06-19 DIAGNOSIS — N3281 Overactive bladder: Secondary | ICD-10-CM | POA: Diagnosis not present

## 2022-06-19 DIAGNOSIS — Z794 Long term (current) use of insulin: Secondary | ICD-10-CM | POA: Diagnosis not present

## 2022-06-19 DIAGNOSIS — F32A Depression, unspecified: Secondary | ICD-10-CM | POA: Diagnosis not present

## 2022-06-19 DIAGNOSIS — I872 Venous insufficiency (chronic) (peripheral): Secondary | ICD-10-CM | POA: Diagnosis not present

## 2022-06-19 DIAGNOSIS — I10 Essential (primary) hypertension: Secondary | ICD-10-CM | POA: Diagnosis not present

## 2022-06-19 DIAGNOSIS — E785 Hyperlipidemia, unspecified: Secondary | ICD-10-CM | POA: Diagnosis not present

## 2022-06-19 DIAGNOSIS — I4891 Unspecified atrial fibrillation: Secondary | ICD-10-CM | POA: Diagnosis not present

## 2022-06-26 DIAGNOSIS — K219 Gastro-esophageal reflux disease without esophagitis: Secondary | ICD-10-CM | POA: Diagnosis not present

## 2022-06-26 DIAGNOSIS — N3281 Overactive bladder: Secondary | ICD-10-CM | POA: Diagnosis not present

## 2022-06-26 DIAGNOSIS — Z794 Long term (current) use of insulin: Secondary | ICD-10-CM | POA: Diagnosis not present

## 2022-06-26 DIAGNOSIS — E1142 Type 2 diabetes mellitus with diabetic polyneuropathy: Secondary | ICD-10-CM | POA: Diagnosis not present

## 2022-06-26 DIAGNOSIS — I10 Essential (primary) hypertension: Secondary | ICD-10-CM | POA: Diagnosis not present

## 2022-06-26 DIAGNOSIS — E785 Hyperlipidemia, unspecified: Secondary | ICD-10-CM | POA: Diagnosis not present

## 2022-06-26 DIAGNOSIS — I4891 Unspecified atrial fibrillation: Secondary | ICD-10-CM | POA: Diagnosis not present

## 2022-06-26 DIAGNOSIS — F32A Depression, unspecified: Secondary | ICD-10-CM | POA: Diagnosis not present

## 2022-06-26 DIAGNOSIS — I872 Venous insufficiency (chronic) (peripheral): Secondary | ICD-10-CM | POA: Diagnosis not present

## 2022-09-23 DEATH — deceased
